# Patient Record
Sex: Female | Born: 1935
Health system: Southern US, Community
[De-identification: ages and names within clinical notes are randomized; demographics above are authoritative.]

## PROBLEM LIST (undated history)

## (undated) DIAGNOSIS — I499 Cardiac arrhythmia, unspecified: Secondary | ICD-10-CM

## (undated) DIAGNOSIS — F411 Generalized anxiety disorder: Secondary | ICD-10-CM

## (undated) DIAGNOSIS — R251 Tremor, unspecified: Secondary | ICD-10-CM

## (undated) DIAGNOSIS — E785 Hyperlipidemia, unspecified: Secondary | ICD-10-CM

## (undated) DIAGNOSIS — A159 Respiratory tuberculosis unspecified: Secondary | ICD-10-CM

## (undated) DIAGNOSIS — Z8673 Personal history of transient ischemic attack (TIA), and cerebral infarction without residual deficits: Secondary | ICD-10-CM

## (undated) DIAGNOSIS — H9190 Unspecified hearing loss, unspecified ear: Secondary | ICD-10-CM

## (undated) DIAGNOSIS — N390 Urinary tract infection, site not specified: Secondary | ICD-10-CM

## (undated) DIAGNOSIS — F32A Depression, unspecified: Secondary | ICD-10-CM

## (undated) DIAGNOSIS — R0600 Dyspnea, unspecified: Secondary | ICD-10-CM

## (undated) DIAGNOSIS — E559 Vitamin D deficiency, unspecified: Secondary | ICD-10-CM

## (undated) DIAGNOSIS — E871 Hypo-osmolality and hyponatremia: Secondary | ICD-10-CM

## (undated) DIAGNOSIS — I10 Essential (primary) hypertension: Secondary | ICD-10-CM

## (undated) DIAGNOSIS — F329 Major depressive disorder, single episode, unspecified: Secondary | ICD-10-CM

## (undated) DIAGNOSIS — R011 Cardiac murmur, unspecified: Secondary | ICD-10-CM

## (undated) DIAGNOSIS — I639 Cerebral infarction, unspecified: Secondary | ICD-10-CM

## (undated) HISTORY — PX: CARDIAC CATHETERIZATION: SHX172

## (undated) HISTORY — DX: Vitamin D deficiency, unspecified: E55.9

## (undated) HISTORY — DX: Personal history of transient ischemic attack (TIA), and cerebral infarction without residual deficits: Z86.73

## (undated) HISTORY — DX: Hypo-osmolality and hyponatremia: E87.1

## (undated) HISTORY — DX: Essential (primary) hypertension: I10

## (undated) HISTORY — DX: Urinary tract infection, site not specified: N39.0

## (undated) HISTORY — DX: Hyperlipidemia, unspecified: E78.5

## (undated) HISTORY — DX: Cardiac arrhythmia, unspecified: I49.9

## (undated) HISTORY — DX: Generalized anxiety disorder: F41.1

## (undated) HISTORY — DX: Cerebral infarction, unspecified: I63.9

## (undated) HISTORY — PX: EXTERNAL EAR SURGERY: SHX627

---
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
## 2004-12-07 ENCOUNTER — Emergency Department: Payer: Self-pay | Admitting: Emergency Medicine

## 2004-12-07 ENCOUNTER — Other Ambulatory Visit: Payer: Self-pay

## 2005-01-23 ENCOUNTER — Emergency Department: Payer: Self-pay | Admitting: General Practice

## 2005-03-24 ENCOUNTER — Ambulatory Visit: Payer: Self-pay | Admitting: Family Medicine

## 2010-05-22 DIAGNOSIS — I639 Cerebral infarction, unspecified: Secondary | ICD-10-CM

## 2010-05-22 HISTORY — DX: Cerebral infarction, unspecified: I63.9

## 2010-09-08 ENCOUNTER — Inpatient Hospital Stay (HOSPITAL_COMMUNITY)
Admission: EM | Admit: 2010-09-08 | Discharge: 2010-09-15 | DRG: 065 | Disposition: A | Discharge status: Rehabilitation Facility | Payer: Medicare Other | Attending: Neurosurgery | Admitting: Neurosurgery

## 2010-09-08 DIAGNOSIS — N390 Urinary tract infection, site not specified: Secondary | ICD-10-CM | POA: Diagnosis present

## 2010-09-08 DIAGNOSIS — Z902 Acquired absence of lung [part of]: Secondary | ICD-10-CM

## 2010-09-08 DIAGNOSIS — I10 Essential (primary) hypertension: Secondary | ICD-10-CM | POA: Diagnosis present

## 2010-09-08 DIAGNOSIS — I619 Nontraumatic intracerebral hemorrhage, unspecified: Principal | ICD-10-CM | POA: Diagnosis present

## 2010-09-08 DIAGNOSIS — F341 Dysthymic disorder: Secondary | ICD-10-CM | POA: Diagnosis present

## 2010-09-08 DIAGNOSIS — E785 Hyperlipidemia, unspecified: Secondary | ICD-10-CM | POA: Diagnosis present

## 2010-09-08 DIAGNOSIS — Z880 Allergy status to penicillin: Secondary | ICD-10-CM

## 2010-09-08 LAB — MRSA PCR SCREENING: MRSA by PCR: NEGATIVE

## 2010-09-09 LAB — GLUCOSE, CAPILLARY
Glucose-Capillary: 106 mg/dL — ABNORMAL HIGH (ref 70–99)
Glucose-Capillary: 104 mg/dL — ABNORMAL HIGH (ref 70–99)

## 2010-09-10 LAB — GLUCOSE, CAPILLARY
Glucose-Capillary: 100 mg/dL — ABNORMAL HIGH (ref 70–99)
Glucose-Capillary: 104 mg/dL — ABNORMAL HIGH (ref 70–99)

## 2010-09-12 ENCOUNTER — Inpatient Hospital Stay (HOSPITAL_COMMUNITY): Payer: Medicare Other

## 2010-09-14 DIAGNOSIS — I619 Nontraumatic intracerebral hemorrhage, unspecified: Secondary | ICD-10-CM

## 2010-09-14 DIAGNOSIS — I69993 Ataxia following unspecified cerebrovascular disease: Secondary | ICD-10-CM

## 2010-09-15 ENCOUNTER — Inpatient Hospital Stay (HOSPITAL_COMMUNITY)
Admission: RE | Admit: 2010-09-15 | Discharge: 2010-09-22 | DRG: 945 | Disposition: A | Discharge status: Home / Self Care | Payer: Medicare Other | Source: Other Acute Inpatient Hospital | Attending: Physical Medicine & Rehabilitation | Admitting: Physical Medicine & Rehabilitation

## 2010-09-15 DIAGNOSIS — E785 Hyperlipidemia, unspecified: Secondary | ICD-10-CM | POA: Diagnosis present

## 2010-09-15 DIAGNOSIS — I619 Nontraumatic intracerebral hemorrhage, unspecified: Secondary | ICD-10-CM | POA: Diagnosis present

## 2010-09-15 DIAGNOSIS — I69993 Ataxia following unspecified cerebrovascular disease: Secondary | ICD-10-CM

## 2010-09-15 DIAGNOSIS — I10 Essential (primary) hypertension: Secondary | ICD-10-CM | POA: Diagnosis present

## 2010-09-15 DIAGNOSIS — Z5189 Encounter for other specified aftercare: Secondary | ICD-10-CM

## 2010-09-15 DIAGNOSIS — F341 Dysthymic disorder: Secondary | ICD-10-CM | POA: Diagnosis present

## 2010-09-16 DIAGNOSIS — I69993 Ataxia following unspecified cerebrovascular disease: Secondary | ICD-10-CM

## 2010-09-16 DIAGNOSIS — I619 Nontraumatic intracerebral hemorrhage, unspecified: Secondary | ICD-10-CM

## 2010-09-16 LAB — DIFFERENTIAL
Eosinophils Relative: 7 % — ABNORMAL HIGH (ref 0–5)
Lymphocytes Relative: 17 % (ref 12–46)
Lymphs Abs: 1.7 10*3/uL (ref 0.7–4.0)
Monocytes Absolute: 1 10*3/uL (ref 0.1–1.0)
Neutro Abs: 6.5 10*3/uL (ref 1.7–7.7)

## 2010-09-16 LAB — COMPREHENSIVE METABOLIC PANEL
ALT: 10 U/L (ref 0–35)
Alkaline Phosphatase: 56 U/L (ref 39–117)
BUN: 5 mg/dL — ABNORMAL LOW (ref 6–23)
CO2: 28 mEq/L (ref 19–32)
GFR calc non Af Amer: >60 mL/min (ref >60)
Glucose, Bld: 101 mg/dL — ABNORMAL HIGH (ref 70–99)
Potassium: 3.5 mEq/L (ref 3.5–5.1)
Total Protein: 5.6 g/dL — ABNORMAL LOW (ref 6.0–8.3)

## 2010-09-16 LAB — CBC
HCT: 36.7 % (ref 36.0–46.0)
Hemoglobin: 12.3 g/dL (ref 12.0–15.0)
MCHC: 33.5 g/dL (ref 30.0–36.0)
MCV: 87.8 fL (ref 78.0–100.0)
RDW: 13.6 % (ref 11.5–15.5)
WBC: 10 10*3/uL (ref 4.0–10.5)

## 2010-09-16 NOTE — H&P (Signed)
Tamara Sparks, Tamara Sparks            ACCOUNT NO.:  [id number]  MEDICAL RECORD NO.:  [id number]           PATIENT TYPE:  I  LOCATION:  4029                         FACILITY:  [facility]  PHYSICIAN:  Erick Colace, M.D.DATE OF BIRTH:  03-05-1929  DATE OF ADMISSION:  09/15/2010 DATE OF DISCHARGE:                             HISTORY & PHYSICAL   REASON FOR ADMISSION:  Poor balance, coordination following right cerebellar intracranial bleed.  HISTORY:  A 77 year old female from [place], [place], admitted on September 08, 2010, from an outside hospital after she developed a dizziness and vomiting while driving to [place] to visit family.  Cranial CT at outside hospital showed a right cerebellar hemorrhage.  Upon admission to the [facility], she had a cranial CT demonstrating a 16 x 21 subacute cerebellar ICH.  No hydrocephalus.  Neurosurgery evaluated the patient. No surgery indicated.  Conservative care including close monitoring ofblood pressure and treatment with Tenormin and Benicar.  Hospital course was complicated by poor appetite.  She was recently widowed 5 days ago. Continues to grieve.  PT, OT had been following the patient, but because of continued need for physical assistance, Physical Medicine Rehab was consulted on September 14, 2010, felt to be good rehab candidate and arrangements were made for admission to inpatient rehab.  REVIEW OF SYSTEMS:  Positive for poor appetite, positive for anxiety, positive for depression, positive for dizziness, otherwise negative.  PAST HISTORY:  Hypertension, hyperlipidemia, anxiety, depression.  She has had past surgical history significant for partial lung resection secondary to TB as well as tympanic membrane repair.  FAMILY HISTORY:  Positive for CAD.  HABITS:  Negative EtOH, negative tobacco.  SOCIAL HISTORY:  Widowed.  Children in the area, plan assistance.  Can also hire assist as needed.  One level home.  FUNCTIONAL HISTORY:   Independent driving prior to admission.  HOME MEDICATIONS: 1. Benicar 40 mg a day. 2. Lorazepam 0.5 daily p.r.n. 3. Atenolol 50 p.o. daily. 4. Zocor 40 mg daily. 5. Vitamin D supplements daily.  ALLERGIES:  PENICILLIN.  PHYSICAL EXAMINATION:  VITAL SIGNS:  Blood pressure 142/62, pulse 72, respirations 19, temperature 98.1. GENERAL:  This is a frail-appearing female, in no acute distress.  Mood and affect are appropriate. HEENT:  Eyes, anicteric, noninjected.  Extraocular movements intact.  No evidence of nystagmus.  External ENT normal. NECK:  Supple without adenopathy. CHEST:  Respiratory effort is good.  Lungs are clear. HEART:  Regular rate and rhythm.  No rubs, murmurs, or extra sounds. ABDOMEN:  Positive bowel sounds, soft, nontender to palpation. EXTREMITIES:  Gait, please refer functional status.  Digits, nails are intact.  Range of motion mildly diminished on the right side in the upper and lower extremity.  There is evidence of dysmetria on finger- nose-finger testing on the right side compared to left side, but this is mild on the right lower extremity.  Heel-to-shin testing shows mild-to- moderate dysmetria.  She is oriented x3.  Affect is depressed, but otherwise no evidence of agitation or lability.  Memory is intact.  POST ADMISSION PHYSICIAN EVALUATION: 1. Functional deficits secondary to right cerebellar ICH with right  hemiataxia. 2. The patient was admitted to receive collaborative interdisciplinary     care between physiatrist, rehab nursing staff, and therapy team. 3. The patient's level of medical complexity and substantial therapy     needs in context of that medical necessity cannot be provided at a     lesser intensive care such as SNF. 4. The patient has experienced substantial functional loss from her     baseline.  Upon functional assessment at the time of preadmission     screening, the patient was at a min assist to mod assist bed     mobility,  mod assist transfers, min assist ambulation bilateral     handheld 40 feet, min assist upper body and max assist lower body     ADLs.  Currently, the patient is a min assist bed mobility, mod     assist transfers, +2 total 90% bilateral handheld assist for     ambulation, min assist upper body dressing, min assist lower body     dressing, mod assist toilet transfers.  Judging by the patient's     diagnosis, physical exam, functional history, the patient has a     potential for functional progress which will result in measurable     gains while on inpatient rehab.  These gains will be of substantial     practical use upon discharge home in facilitating mobility, self-     care, and independence.  Interim change in medical status since     preadmission screening are detailed in the history of present     illness. 5. Physiatrist will provide 24-hour management in medical needs as     well as oversight of therapy plan/treatment and provide guidance     appropriate regarding interactions of the two. 6. A 24-hour rehab nursing will assess in management of skin, bowel,     bladder, safety, and help integrate therapy concepts, techniques,     and education. 7. PT will assess and treat for pregait training, gait training,     endurance, safety, equipment goals are for a modified independent     level for all mobility. 8. OT will assess and treat for ADLs, safety, coordination, equipment     goals are for modified independent level with supervision with     ADLs. 9. Case management and social work will assess and treat for     psychosocial issues and discharge planning. 10.Team conference will be held weekly to assess the patient's     progress/goals and to determine barriers to discharge. 11.The patient has demonstrated sufficient medical stability and     exercise capacity to tolerate at least 3 hours therapy per day at     least 5 days per week. 12.Estimated length of stay is 10 days.   Prognosis for further     functional improvement is good.  MEDICAL PROBLEMS AND PLAN: 1. DVT prophylaxis with thigh-high TEDs as well as SCDs. 2. Pain management mainly headaches, Tylenol. 3. Mood, grieving, Psychology to eval. 4. Hypertension.  Tenormin and Benicar monitor with increased     activity. 5. Hyperlipidemia.  Zocor.  Monitor for signs of diffuse weakness. 6. Anxiety, depression, lorazepam p.r.n.  Provide emotional support in     conjunction with Psychology.  The patient has adequate motivation and mood for inpatient rehab.  We will also need to monitor her intake.     Erick Colace, M.D.     AEK/MEDQ  D:  09/15/2010  T:  09/16/2010  Job:  [id number]  cc:   Melina Fiddler, M.D.  Electronically Signed by Claudette Laws M.D. on 09/16/2010 06:15:00 AM

## 2010-09-20 DIAGNOSIS — I69993 Ataxia following unspecified cerebrovascular disease: Secondary | ICD-10-CM

## 2010-09-20 DIAGNOSIS — I619 Nontraumatic intracerebral hemorrhage, unspecified: Secondary | ICD-10-CM

## 2010-09-21 DIAGNOSIS — F4321 Adjustment disorder with depressed mood: Secondary | ICD-10-CM

## 2010-09-27 ENCOUNTER — Ambulatory Visit: Payer: Medicare Other | Admitting: Physical Therapy

## 2010-09-29 NOTE — Discharge Summary (Signed)
NAMEROZLYNN, LIPPOLD            ACCOUNT NO.:  [id number]  MEDICAL RECORD NO.:  [id number]           PATIENT TYPE:  I  LOCATION:  4029                         FACILITY:  [facility]  PHYSICIAN:  Ranelle Oyster, M.D.DATE OF BIRTH:  11-08-1928  DATE OF ADMISSION:  09/15/2010 DATE OF DISCHARGE:  09/22/2010                              DISCHARGE SUMMARY   DISCHARGE DIAGNOSES: 1. Right cerebellar intracerebral hemorrhage, Tylenol. 2. Depression with anxiety. 3. Hypertension. 4. Hyperlipidemia.  This is a 77 year old white female from [place], [place], admitted on September 08, 2010, from outside hospital after she developed dizziness and vomiting while driving to [place] to visit her family.  Cranial CT scan at outside hospital showed a right cerebellar hemorrhage.  Upon admission to [facility], cranial CT scan showing a 16 x 21 mm subacute cerebellar intracerebral hemorrhage without hydrocephalus. Neurosurgery, Dr. Coletta Memos, no surgery indicated with conservative care, close monitoring of blood pressure.  Hospital course with some decrease in appetite.  She was recently widowed with bereavement counseling provided.  She was minimal assist for ambulation.  She was admitted for comprehensive rehab program.  PAST MEDICAL HISTORY:  See discharge diagnoses.  No alcohol or tobacco.  ALLERGIES:  PENICILLIN.  SOCIAL HISTORY:  She is widowed times a month, children in area with good assistance, 1-level home.  FUNCTIONAL HISTORY PRIOR TO ADMISSION:  Independent.  She does drive.  FUNCTIONAL STATUS UPON ADMISSION TO REHAB SERVICES:  Minimum to moderate assist bed mobility, moderate assist transfers, handheld assist 40 feet to ambulate, minimal assist lower body and activities of daily living.  MEDICATIONS PRIOR TO ADMISSION: 1. Benicar 40 mg daily. 2. Lorazepam 0.5 mg daily. 3. Atenolol 50 mg daily. 4. Zocor 40 mg daily. 5. Vitamin D daily.  PHYSICAL EXAMINATION:   VITAL SIGNS:  Blood pressure 142/62, pulse 72, temperature 98.1, and respirations 19. NEUROLOGIC:  This was an alert female in no acute distress, oriented x3, emotionally labile.  Deep tendon reflexes are 2+.  Sensation is intact to light touch. LUNGS:  Clear to auscultation. CARDIAC:  Regular rate and rhythm. ABDOMEN:  Soft and nontender.  Good bowel sounds.  REHABILITATION HOSPITAL COURSE:  The patient was admitted to Inpatient Rehab Services with therapies initiated on a 3-hour daily basis consisting of physical therapy, occupational therapy, and 24-hour rehabilitation nursing.  The following issues were addressed during the patient's rehabilitation stay.  Pertaining to Ms. Meixner's right cerebellar intracerebral hemorrhage, remained stable.  Close monitoring of blood pressures.  She would follow up with Dr. Coletta Memos of Neurosurgery.  She denied any increased headache, nausea, or vomiting. Her blood pressures were well controlled on Tenormin which was titrated to 100 mg daily.  She remained on Benicar with the addition of low-dose hydrochlorothiazide on Sep 21, 2010.  She would follow up with her primary care provider.  There was no orthostatic hypotension noted.  She will continue on Zocor for hyperlipidemia.  She received grieving counseling from neuropsychologist, Dr. Eula Flax, recently widowed x1 month.  She had been placed on Celexa as well as low-dose Xanax as needed with emotional support provided.  The patient received  weekly collaborative interdisciplinary team conferences to discuss estimated length of stay, family teaching, and any barriers to discharge.  She was supervision with activities of daily living, minimal assist ambulate, supervision transfers.  She exhibited no unsafe behavior.  She was continent of bowel and bladder.  Strength and endurance greatly improved throughout her rehab stay.  She was discharged to home with home health therapies as  indicated per [employer] with family support.  LATEST LABORATORY DATA:  Sodium 139, potassium 3.5, BUN 5, and creatinine 0.6.  Hemoglobin 12.3, hematocrit 36.7, and platelets 348,000.  DISCHARGE MEDICATIONS AT THE TIME OF DICTATION: 1. Hydrochlorothiazide 12.5 mg daily. 2. Vitamin D 400 units daily. 3. Benicar 40 mg daily. 4. Zocor 40 mg daily. 5. Multivitamin daily. 6. Celexa 10 mg at bedtime. 7. Tenormin 100 mg daily. 8. Tylenol as needed.  DIET:  Regular.  SPECIAL INSTRUCTION:  No driving.  No smoking.  No alcohol.  Follow up with Dr. Coletta Memos, Neurosurgery, [phone], call for appointment; Dr. Faith Rogue at the Outpatient Rehab Service office as advised; and Dr. Lacie Scotts [facility] 2 weeks, call for appointment.  Ongoing therapies would be dictated as per [employer].     Mariam Dollar, P.A.   ______________________________ Ranelle Oyster, M.D.    DA/MEDQ  D:  09/21/2010  T:  09/21/2010  Job:  [id number]  cc:   Melina Fiddler, M.D.  Electronically Signed by Mariam Dollar P.A. on 09/21/2010 12:11:17 PM Electronically Signed by Faith Rogue M.D. on 09/29/2010 09:48:12 AM

## 2010-10-07 NOTE — H&P (Signed)
Tamara Sparks, Tamara Sparks            ACCOUNT NO.:  [id number]  MEDICAL RECORD NO.:  [id number]           PATIENT TYPE:  I  LOCATION:  2116                         FACILITY:  [facility]  PHYSICIAN:  Coletta Memos, M.D.     DATE OF BIRTH:  June 01, 1928  DATE OF ADMISSION:  09/08/2010 DATE OF DISCHARGE:                             HISTORY & PHYSICAL   ADMISSION DIAGNOSIS:  Right cerebellar hemorrhage.  INDICATIONS:  Tamara Sparks is a 77 year old woman who was driving to the area around [place], [place], when she had the  acute onset of dizziness and emesis.  She vomited three times rapidly in succession according to the history provided by [facility] and Tamara Sparks.  She has been dizzy in the past at times, but she had not had anything occur quite like this.  She had no abdominal pain.  She had no diarrhea.  She had no evidence of gastrointestinal infection.  She was in a car on a trip from [place], but decided to keep ongoing.  When they arrived at their destination, then she was taken to the [facility] [place].  At that time, she was admitted to the hospital for observation.  Medications upon presentation were Ativan, simvastatin, atenolol, and Benicar.  She was in the hospital was noted to also have a urinary tract infection.  It was felt that this might have been the reason for the nausea and vomiting.  However, she was given some IV Rocephin for this problem, but she still did not feel quite right.  A head CT was finally ordered today on September 08, 2010, and that revealed a right cerebellar hemorrhage.  There was very slight mass effect on the fourth ventricle, absolutely no evidence of hydrocephalus.  She has been stable.  She was therefore transferred to [facility] as Tamara Sparks and the family are from the [place] area and she wanted to be closer to home.  Medications on transfer included Ativan, simvastatin, atenolol,  Benicar, Rocephin, meclizine, and Zofran.  She was felt to be volume depleted and was treated for that there also.  She has an allergy to PENICILLIN.  She has not been ambulatory secondary to problems with coordination.  She also has reported weakness on left side and blurred vision.  Her exam in the emergency room did not show evidence of weakness.  She does not use tobacco.  She does not use illicit drugs or alcohol.  She recently lost her husband 4 weeks ago.  REVIEW OF SYSTEMS:  Negative for constitutional, eye, ear, nose or mouth, cardiovascular, gastrointestinal, musculoskeletal, skin, psychiatric, endocrine or hematologic problems.  She has been very depressed secondary to her husband's death.  She has no pain.  No persistent headaches outside of one which she describes as frontal currently.  Mother died in her 74 secondary to natural causes, type unknown.  Father had coronary artery disease and is also deceased.  She has undergone a partial lung resection secondary to tuberculosis. She also had a tympanic membrane repair, but she does not know what was done exactly.  On admission, she had a white count  of 22,000, platelet count of 395,000.  Sodium was 138, potassium 3.6.  Quite honestly, Tamara Sparks has been quite stable since her admission to [facility].  I will contact Stroke Service.  She will certainly stay on my service overnight at the least, but she does not have any surgical issues at this time.  Looking at a head CT again while she does have some distortion of fourth ventricle, it is not close.  She has absolutely no hydrocephalus and ventricles are normal in size.  No effacement.  There is some hypodensity around the clot already indicating that her story is consistent with this scan showing some blood that has been there for some time.  I will continue the Rocephin for the UTI and provide close monitoring while here in the intensive care  unit.  NEUROLOGIC:  Pupils equal, round, react to light.  Full extraocular movements.  Full visual fields.  Speech is clear and fluent.  No nystagmus.  No dysarthria, 5/5 strength in the upper and lower extremities.  No drift.  Normal proprioception upper and lower extremities.  Normal muscle tone, bulk and coordination, symmetric smile, symmetric facies.  Hearing intact to voice bilaterally. NECK:  No cervical masses or bruits. LUNGS: Clear. HEART: Regular rhythm and rate. ABDOMEN:  Soft, nontender.  Bowel sounds present.          ______________________________ Coletta Memos, M.D.     KC/MEDQ  D:  09/08/2010  T:  09/09/2010  Job:  [id number]  Electronically Signed by Coletta Memos M.D. on 10/07/2010 11:32:14 AM

## 2010-10-11 ENCOUNTER — Ambulatory Visit: Payer: Medicare Other | Admitting: Occupational Therapy

## 2010-10-27 NOTE — H&P (Signed)
  NAMECHESTINE, BELKNAP            ACCOUNT NO.:  [id number]  MEDICAL RECORD NO.:  [id number]           PATIENT TYPE:  LOCATION:                                 FACILITY:  PHYSICIAN:  Coletta Memos, M.D.     DATE OF BIRTH:  06-29-1928  DATE OF ADMISSION: DATE OF DISCHARGE:                             HISTORY & PHYSICAL   This is the second admission history and physical which I am dictating. She has a full written H and P in the chart which is sufficient for legal matters.  Tamara Sparks is a 77 year old woman transferred secondary to a CT performed on September 08, 2010, revealing a cerebellar hematoma.  She presented originally on September 06, 2010, with explosive emesis and associated with nausea.  She was noted to have a urinary tract infection and was receiving treatment for that.  While in the hospital, we eventually performed a CT showing a cerebellar hematoma and she was transferred for further evaluation and care.  Since that time of her admission on September 06, 2010, she has been stable.  PAST MEDICAL HISTORY: 1. Nausea and vomiting. 2. Tuberculosis. 3. Hypertension. 4. Hyperlipidemia. 5. Anxiety disorder. 6. UTI.  REVIEW OF SYSTEMS:  Positive for nausea, emesis, hypertension, hyperlipidemia, and anxiety disorder.  She has had some problems with dizziness and coordination.  No hematologic, allergic, endocrine, constitutional, or gastrointestinal problems.  PHYSICAL EXAMINATION:  GENERAL:  She is alert, oriented x4 and answering all questions appropriately.  Speech is clear and fluent. NEUROLOGIC:  Pupils are equal, round, and reactive to light.  Full extraocular movements.  Full visual fields.  She has symmetric facies. Tongue protrudes in midline.  Uvula elevates in midline.  Shoulder shrug is intact.  There is no drift on exam.  5/5 strength in upper extremities and lower extremities.  Normal muscle tone, bulk, and coordination.  No cervical masses or bruits. LUNGS:   Clear. HEART:  Regular rhythm and rate.  No murmurs or rubs are appreciated. EXTREMITIES:  I did not assess gait as I left her in the hospital bed.  Head CT revealed a right cerebellar intracerebral hematoma with surrounding hypodensity, some mild distortion of the fourth ventricle. There was no hydrocephalus.  Fourth ventricle was patent.  She will be admitted to 2100 for observation.  I will repeat the head CT.  She does not need a ventricular catheter.  She does not need operative decompression at this time.          ______________________________ Coletta Memos, M.D.     KC/MEDQ  D:  10/07/2010  T:  10/08/2010  Job:  [id number]  Electronically Signed by Coletta Memos M.D. on 10/27/2010 04:54:09 PM

## 2010-11-02 ENCOUNTER — Inpatient Hospital Stay: Payer: Medicare Other | Admitting: Physical Medicine & Rehabilitation

## 2010-11-09 ENCOUNTER — Ambulatory Visit: Payer: Medicare Other | Admitting: Occupational Therapy

## 2010-11-16 ENCOUNTER — Encounter: Payer: Medicare Other | Admitting: Physical Medicine & Rehabilitation

## 2010-12-09 NOTE — Discharge Summary (Signed)
  [name], [name]            ACCOUNT NO.:  [id number]  MEDICAL RECORD NO.:  [id number]  LOCATION:  3038                         FACILITY:  [facility]  PHYSICIAN:  Coletta Memos, M.D.     DATE OF BIRTH:  Feb 08, 1929  DATE OF ADMISSION:  09/08/2010 DATE OF DISCHARGE:  09/15/2010                              DISCHARGE SUMMARY   ADMITTING DIAGNOSES: 1. Cerebellar hematoma, right cerebellar hemisphere. 2. Emesis and nausea.  INDICATIONS:  Ms. Boulos was admitted on April 19 secondary to a right cerebellar hemispheric hematoma.  She had associated emesis and nausea.  She had an UTI, was in another hospital when this hematoma was found.  She was transferred secondary to the CT, which was performed on September 08, 2010.  She is alert, oriented x4, answered all questions appropriately.  Speech is clear and fluent.  She had no hydrocephalus. She needed no surgical treatment.  She did have some mild dysdiadochokinesis on her right side, mild difficulty with heel-to-shin testing.  She had some problems with blood pressure, which were controlled with medications.  She had a Medicine consult for that.  She was transferred to rehab, doing well, and without problems.  Family was involved and she was discharged to rehab on April 26.          ______________________________ Coletta Memos, M.D.     KC/MEDQ  D:  12/08/2010  T:  12/08/2010  Job:  [id number]  Electronically Signed by Coletta Memos M.D. on 12/09/2010 05:27:38 PM

## 2014-01-21 ENCOUNTER — Ambulatory Visit (INDEPENDENT_AMBULATORY_CARE_PROVIDER_SITE_OTHER): Payer: Medicare Other | Admitting: Cardiology

## 2014-01-21 ENCOUNTER — Encounter: Payer: Self-pay | Admitting: Cardiology

## 2014-01-21 VITALS — BP 100/60 | HR 73 | Ht 60.0 in | Wt 109.5 lb

## 2014-01-21 DIAGNOSIS — R011 Cardiac murmur, unspecified: Secondary | ICD-10-CM

## 2014-01-21 DIAGNOSIS — R000 Tachycardia, unspecified: Secondary | ICD-10-CM

## 2014-01-21 DIAGNOSIS — I10 Essential (primary) hypertension: Secondary | ICD-10-CM

## 2014-01-21 NOTE — Patient Instructions (Signed)
Your physician has recommended you make the following change in your medication:  Stop Amlodipine (do not throw it away incase we need to restart it)  Call if you systolic blood pressure is >120 consistently   Your physician has requested that you have an echocardiogram. Echocardiography is a painless test that uses sound waves to create images of your heart. It provides your doctor with information about the size and shape of your heart and how well your heart's chambers and valves are working. This procedure takes approximately one hour. There are no restrictions for this procedure.   Your physician recommends that you schedule a follow-up appointment in:  1-2 months   Call us if you have anymore episodes

## 2014-01-23 ENCOUNTER — Encounter: Payer: Self-pay | Admitting: Cardiology

## 2014-01-23 DIAGNOSIS — I38 Endocarditis, valve unspecified: Secondary | ICD-10-CM | POA: Insufficient documentation

## 2014-01-23 DIAGNOSIS — I10 Essential (primary) hypertension: Secondary | ICD-10-CM | POA: Insufficient documentation

## 2014-01-23 DIAGNOSIS — R000 Tachycardia, unspecified: Secondary | ICD-10-CM | POA: Insufficient documentation

## 2014-01-23 HISTORY — DX: Endocarditis, valve unspecified: I38

## 2014-01-23 NOTE — Assessment & Plan Note (Signed)
If anything a bit hypotensive today. I would prefer for her to probably have some mild permissive hypertension.  Plan: Stop amlodipine and continue beta blocker plus ARB. Periodically monitor blood pressures at home.

## 2014-01-23 NOTE — Assessment & Plan Note (Addendum)
Interestingly finding that sounds potentially like aortic regurgitation. We will evaluate with the echocardiogram..

## 2014-01-23 NOTE — Progress Notes (Signed)
PATIENT: Tamara Sparks MRN: [id number] DOB: 02-05-1929 PCP: Wonda Cheng, MD  Clinic Note: Chief Complaint  Patient presents with  . other    Ref by [facility] due to arrhythmia. Meds reviewed verbally with pt.   HPI: Tamara Sparks is a 80 y.o. female with a PMH below who presents today for evaluations of heart arrhythmia.. She is a very pleasant elderly woman with a history of hypertension, hyponatremia, anxiety and dyslipidemia as well as a history of a small right cerebellar intracerebral hemorrhage in 2012.  She has been treated for hypertension for quite some time she, and inadvertently stopped taking Bystolic in August and using it with another medication that was scheduled to be discontinued. Coincident with her stopping her Bystolic, he started noting intermittent episodes of feeling as though her heart was racing very fast. It made her feel little bit dizzy and harder to breathe to but really denied any chest tightness or pressure associated with it. No real significant dyspnea. She doesn't drink a lot of coffee or other caffeinated beverages, and was unable to tell any activity or behaviors that would elicit this heart rate response. This came out of the blue. She's not had any syncope or near syncope but has felt somewhat fatigued and drained and the cervix then dizzy related to recently diagnosed UTI with dysuria. She felt warm but not really truly febrile. Interestingly, since she has restarted her Bystolic after her visit to discuss the UTI, she's not had any further palpitations.  She denies any PND, orthopnea or edema. Prior to this bout of palpitations, she's never had symptoms like this before.  Past Medical History  Diagnosis Date  . Urinary tract infection, site not specified   . Unspecified essential hypertension   . Cardiac dysrhythmia, unspecified   . Anxiety state, unspecified   . Unspecified vitamin D deficiency   . Dyslipidemia, goal LDL below 130   .  Hyposmolality and/or hyponatremia   . History of stroke     Right cerebellar intracerebral hemorrhage    Prior Cardiac Evaluation and Past Surgical History: Past Surgical History  Procedure Laterality Date  . Cardiac catheterization      [facility]    Allergies  Allergen Reactions  . Penicillins   . Sulfa Antibiotics     Current Outpatient Prescriptions  Medication Sig Dispense Refill  . aspirin 81 MG tablet Take 81 mg by mouth daily.      . Cholecalciferol (VITAMIN D3) 5000 UNITS CAPS Take by mouth daily.      . ciprofloxacin (CIPRO) 250 MG tablet Take 250 mg by mouth 2 (two) times daily.       Marland Kitchen LORazepam (ATIVAN) 0.5 MG tablet Take 0.5 mg by mouth every 8 (eight) hours.      Marland Kitchen losartan (COZAAR) 100 MG tablet Take 100 mg by mouth daily.      . nebivolol (BYSTOLIC) 10 MG tablet Take 10 mg by mouth daily.      . simvastatin (ZOCOR) 20 MG tablet Take 20 mg by mouth daily.       No current facility-administered medications for this visit.    History   Social History Narrative   Widowed. Accompanied by daughter today.   No illicit drug use. Never smoked. Does not drink alcohol.   family history includes Hypertension in her father; Stroke in her father and mother.  ROS: A comprehensive Review of Systems - was performed Review of Systems  Constitutional: Positive for fever. Negative for weight  loss.       Recently diagnosed with UTI  HENT: Negative for hearing loss.   Eyes: Positive for blurred vision.       Reportedly is due to have cataract surgery  Respiratory: Negative for cough, hemoptysis, sputum production, shortness of breath and wheezing.   Cardiovascular: Positive for palpitations. Negative for chest pain, orthopnea, claudication, leg swelling and PND.       As noted in HPI  Gastrointestinal: Positive for blood in stool. Negative for nausea, vomiting, diarrhea, constipation and melena.  Genitourinary: Positive for dysuria, urgency and frequency. Negative for hematuria  and flank pain.  Neurological: Negative for dizziness, tingling, tremors, speech change, focal weakness, seizures, loss of consciousness, weakness and headaches.  Endo/Heme/Allergies: Negative for polydipsia. Does not bruise/bleed easily.  All other systems reviewed and are negative.  PHYSICAL EXAM BP 100/60  Pulse 73  Ht 5' (1.524 m)  Wt 109 lb 8 oz (49.669 kg)  BMI 21.39 kg/m2 Physical Exam  Constitutional: She is oriented to person, place, and time. She appears well-nourished. No distress.  HENT:  Head: Normocephalic and atraumatic.  Mouth/Throat: Oropharynx is clear and moist.  Wears glasses  Eyes: EOM are normal. Pupils are equal, round, and reactive to light. No scleral icterus.  Neck: Normal range of motion. Neck supple. No JVD present. No tracheal deviation present. No thyromegaly present.  Cardiovascular: Normal rate, regular rhythm and intact distal pulses.  Exam reveals no gallop, no S3, no S4, no friction rub, no midsystolic click and no opening snap.   Murmur heard. Soft 1/6 diastolic murmur at the LUSB; did not hear SEM  Pulmonary/Chest: Effort normal and breath sounds normal. No respiratory distress. She has no wheezes. She has no rales. She exhibits no tenderness.  Abdominal: Soft. Bowel sounds are normal. She exhibits no distension. There is no tenderness.  Genitourinary:  Defer  Musculoskeletal: Normal range of motion. She exhibits no edema.  Neurological: She is alert and oriented to person, place, and time. No cranial nerve deficit. Coordination normal.  Somewhat poor historian. A bit confused. Her daughter fills in the gaps  Skin: Skin is warm and dry. No rash noted. No erythema.  Psychiatric: She has a normal mood and affect.  A bit confused    Adult ECG Report  Rate: 73 ;  Rhythm: normal sinus rhythm  QRS Axis: -49 ;  PR Interval: 174 ;  QRS Duration: 90 ; QTc: 438  Voltages: Borderline Low (lower in limb leads and precordial)   Narrative Interpretation:  Normal sinus rhythm with left anterior fascicular block.  Recent Labs: Reviewed from PCPs note  Sodium 134, potassium 3.2, chloride 88, bicarbonate 27, BUN 7, creatinine 0.4, calcium 10.1. Glucose 120  CBC: W. 10.2, H./H. 13.9/41.1, platelets 397  ASSESSMENT / PLAN: Very pleasant elderly woman who may be nearly stages of dementia who is being evaluated for palpitations but seems to be constant with her having stopped her beta blocker. My concern is that this could very well and presented a rebound effect from being off of Bystolic. In fact, her blood pressure is actually relatively low today which is somewhat concerning in an elderly woman for potential risk of fall and higher susceptibility to vasovagal syncope. Interestingly, she has a diastolic murmur which sounds like aortic insufficiency based on location. Is not loud, but does not appear to be previously noted.  Rapid heart beat I truly think this is probably related to her coming off of the Bystolic. Since she's not had  any more episodes in the last week, not sure if we pick anything up on a monitor. She does have a murmur that had not previously been evaluated. I don't think this is a new due to the palpitations however he will obtain an echocardiogram to assess for any structural abnormalities.  At this point, and condition. Continue with the beta blocker and try to avoid stopping it to avoid rebound tachycardia and hypertension.  If palpitations recur or if she is more symptomatic, I have asked that she contact our office and noted to have a cardiac event monitor ordered.  Diastolic murmur Interestingly finding that sounds potentially like aortic regurgitation. We will evaluate with the echocardiogram..  Essential hypertension If anything a bit hypotensive today. I would prefer for her to probably have some mild permissive hypertension.  Plan: Stop amlodipine and continue beta blocker plus ARB. Periodically monitor blood pressures at  home.   Orders Placed This Encounter  Procedures  . EKG 12-Lead    Order Specific Question:  Where should this test be performed    Answer:  [facility]  . 2D Echocardiogram without contrast    Standing Status: Future     Number of Occurrences:      Standing Expiration Date: 01/21/2015    Order Specific Question:  Type of Echo    Answer:  Complete    Order Specific Question:  Where should this test be performed    Answer:  [facility]    Order Specific Question:  Reason for exam-Echo    Answer:  Murmur  785.2    Followup: 1-2 months  Amisha Pospisil W. Herbie [place], M.D., M.S. Interventional Cardiolgy [facility]

## 2014-01-23 NOTE — Assessment & Plan Note (Signed)
I truly think this is probably related to her coming off of the Bystolic. Since she's not had any more episodes in the last week, not sure if we pick anything up on a monitor. She does have a murmur that had not previously been evaluated. I don't think this is a new due to the palpitations however he will obtain an echocardiogram to assess for any structural abnormalities.  At this point, and condition. Continue with the beta blocker and try to avoid stopping it to avoid rebound tachycardia and hypertension.  If palpitations recur or if she is more symptomatic, I have asked that she contact our office and noted to have a cardiac event monitor ordered.

## 2014-01-30 ENCOUNTER — Other Ambulatory Visit: Payer: Medicare Other

## 2014-03-25 ENCOUNTER — Ambulatory Visit: Payer: Medicare Other | Admitting: Cardiology

## 2014-07-23 DIAGNOSIS — F419 Anxiety disorder, unspecified: Secondary | ICD-10-CM | POA: Diagnosis not present

## 2014-07-23 DIAGNOSIS — E784 Other hyperlipidemia: Secondary | ICD-10-CM | POA: Diagnosis not present

## 2014-07-23 DIAGNOSIS — I10 Essential (primary) hypertension: Secondary | ICD-10-CM | POA: Diagnosis not present

## 2014-12-08 ENCOUNTER — Ambulatory Visit (INDEPENDENT_AMBULATORY_CARE_PROVIDER_SITE_OTHER): Payer: Medicare Other | Admitting: Family Medicine

## 2014-12-08 ENCOUNTER — Encounter: Payer: Self-pay | Admitting: Family Medicine

## 2014-12-08 ENCOUNTER — Encounter (INDEPENDENT_AMBULATORY_CARE_PROVIDER_SITE_OTHER): Payer: Self-pay

## 2014-12-08 VITALS — BP 124/71 | HR 68 | Temp 98.5°F | Resp 17 | Ht 61.0 in | Wt 105.8 lb

## 2014-12-08 DIAGNOSIS — F411 Generalized anxiety disorder: Secondary | ICD-10-CM | POA: Diagnosis not present

## 2014-12-08 DIAGNOSIS — I10 Essential (primary) hypertension: Secondary | ICD-10-CM

## 2014-12-08 DIAGNOSIS — F419 Anxiety disorder, unspecified: Secondary | ICD-10-CM | POA: Insufficient documentation

## 2014-12-08 MED ORDER — LORAZEPAM 0.5 MG PO TABS: 0.5000 mg | ORAL_TABLET | Freq: Two times a day (BID) | ORAL | Status: DC | PRN

## 2014-12-08 MED ORDER — LOSARTAN POTASSIUM 100 MG PO TABS: 100.0000 mg | ORAL_TABLET | Freq: Every day | ORAL | Status: DC

## 2014-12-08 NOTE — Progress Notes (Signed)
Name: Tamara Sparks   MRN: [id number]    DOB: [birth year]   Date:12/08/2014       Progress Note  Subjective  Chief Complaint  Chief Complaint  Patient presents with  . Medication Refill    Anxiety Presents for follow-up visit. Symptoms include nervous/anxious behavior and restlessness. Patient reports no chest pain, excessive worry, feeling of choking, insomnia, palpitations or shortness of breath.   Past treatments include benzodiazephines. Compliance with prior treatments has been good.  Hypertension This is a chronic problem. The problem is controlled. Associated symptoms include anxiety. Pertinent negatives include no blurred vision, chest pain, headaches, palpitations or shortness of breath. Past treatments include beta blockers and angiotensin blockers. The current treatment provides significant improvement. Hypertensive end-organ damage includes CVA. There is no history of angina, kidney disease or CAD/MI.      Past Medical History  Diagnosis Date  . Urinary tract infection, site not specified   . Unspecified essential hypertension   . Cardiac dysrhythmia, unspecified   . Anxiety state, unspecified   . Unspecified vitamin D deficiency   . Dyslipidemia, goal LDL below 130   . Hyposmolality and/or hyponatremia   . History of stroke     Right cerebellar intracerebral hemorrhage  . Stroke     Past Surgical History  Procedure Laterality Date  . Cardiac catheterization      [facility]  . Lobectomy Right 1940s  . External ear surgery Left     Family History  Problem Relation Age of Onset  . Stroke Mother   . Hypertension Father   . Stroke Father     History   Social History  . Marital Status: Widowed    Spouse Name: N/A  . Number of Children: N/A  . Years of Education: N/A   Occupational History  . Not on file.   Social History Main Topics  . Smoking status: Never Smoker   . Smokeless tobacco: Not on file  . Alcohol Use: No  . Drug Use: No  . Sexual  Activity: Not on file   Other Topics Concern  . Not on file   Social History Narrative   Widowed. Accompanied by daughter today.   No illicit drug use. Never smoked. Does not drink alcohol.     Current outpatient prescriptions:  .  aspirin 81 MG tablet, Take 81 mg by mouth daily., Disp: , Rfl:  .  Cholecalciferol (VITAMIN D3) 5000 UNITS CAPS, Take by mouth daily., Disp: , Rfl:  .  LORazepam (ATIVAN) 0.5 MG tablet, Take 1 tablet (0.5 mg total) by mouth 2 (two) times daily as needed for anxiety., Disp: 60 tablet, Rfl: 2 .  losartan (COZAAR) 100 MG tablet, Take 1 tablet (100 mg total) by mouth daily., Disp: 90 tablet, Rfl: 0 .  nebivolol (BYSTOLIC) 10 MG tablet, Take 10 mg by mouth daily., Disp: , Rfl:  .  simvastatin (ZOCOR) 20 MG tablet, Take 20 mg by mouth daily., Disp: , Rfl:   Allergies  Allergen Reactions  . Penicillins   . Sulfa Antibiotics      Review of Systems  Eyes: Negative for blurred vision.  Respiratory: Negative for shortness of breath.   Cardiovascular: Negative for chest pain and palpitations.  Neurological: Negative for headaches.  Psychiatric/Behavioral: The patient is nervous/anxious. The patient does not have insomnia.       Objective  Filed Vitals:   12/08/14 1541  BP: 124/71  Pulse: 68  Temp: 98.5 F (36.9 C)  TempSrc: Oral  Resp: 17  Height: 5\' 1"  (1.549 m)  Weight: 105 lb 12.8 oz (47.991 kg)  SpO2: 96%    Physical Exam  Constitutional: She is oriented to person, place, and time and well-developed, well-nourished, and in no distress.  HENT:  Head: Normocephalic and atraumatic.  Eyes: Pupils are equal, round, and reactive to light.  Cardiovascular: Normal rate and regular rhythm.   Pulmonary/Chest: Effort normal and breath sounds normal.  Musculoskeletal: She exhibits no edema.  Neurological: She is alert and oriented to person, place, and time.  Skin: Skin is warm and dry.  Psychiatric: Mood, memory, affect and judgment normal.   Nursing note and vitals reviewed.     Assessment & Plan 1. Essential hypertension Blood pressure well controlled on present antihypertensive therapy. Refills provided. - losartan (COZAAR) 100 MG tablet; Take 1 tablet (100 mg total) by mouth daily.  Dispense: 90 tablet; Refill: 0  2. Generalized anxiety disorder In terms of anxiety are responsive to Ativan 0.5 mgdaily when necessary. Patient understands the tolerance potential of benzodiazepines. Refills provided and follow-up in 3 months - LORazepam (ATIVAN) 0.5 MG tablet; Take 1 tablet (0.5 mg total) by mouth 2 (two) times daily as needed for anxiety.  Dispense: 60 tablet; Refill: 2    [name] [name] Faylene KurtzShah [facility] 12/08/2014 4:41 PM

## 2015-03-02 ENCOUNTER — Telehealth: Payer: Self-pay | Admitting: Family Medicine

## 2015-03-02 NOTE — Telephone Encounter (Signed)
Pt needs refill on Bystolic to be sent to [facility]

## 2015-03-03 MED ORDER — NEBIVOLOL HCL 10 MG PO TABS: 10.0000 mg | ORAL_TABLET | Freq: Every day | ORAL | Status: DC

## 2015-03-03 NOTE — Telephone Encounter (Signed)
Medication has been refilled and sent to [facility] [address] 

## 2015-03-07 ENCOUNTER — Emergency Department
Admission: EM | Admit: 2015-03-07 | Discharge: 2015-03-07 | Disposition: A | Discharge status: Home / Self Care | Payer: Medicare Other | Attending: Emergency Medicine | Admitting: Emergency Medicine

## 2015-03-07 DIAGNOSIS — Z7982 Long term (current) use of aspirin: Secondary | ICD-10-CM | POA: Insufficient documentation

## 2015-03-07 DIAGNOSIS — Z880 Allergy status to penicillin: Secondary | ICD-10-CM | POA: Diagnosis not present

## 2015-03-07 DIAGNOSIS — J029 Acute pharyngitis, unspecified: Secondary | ICD-10-CM | POA: Diagnosis present

## 2015-03-07 DIAGNOSIS — Z79899 Other long term (current) drug therapy: Secondary | ICD-10-CM | POA: Insufficient documentation

## 2015-03-07 DIAGNOSIS — I10 Essential (primary) hypertension: Secondary | ICD-10-CM | POA: Diagnosis not present

## 2015-03-07 DIAGNOSIS — J312 Chronic pharyngitis: Secondary | ICD-10-CM | POA: Diagnosis not present

## 2015-03-07 MED ORDER — DOXYCYCLINE HYCLATE 50 MG PO CAPS: 100.0000 mg | ORAL_CAPSULE | Freq: Two times a day (BID) | ORAL | Status: DC

## 2015-03-07 NOTE — ED Provider Notes (Signed)
[facility] Emergency Department Provider Note  ____________________________________________  Time seen: On arrival  I have reviewed the triage vital signs and the nursing notes.   HISTORY  Chief Complaint Sore Throat    HPI Tamara Sparks is a 81 y.o. female who complains of sore throat for approximately 6 months. She describes it as mild and irritating and sometimes worse in the morning. Today she checked her temperature and her temperature was 104 but in the emergency department is normal. She has a history of high blood pressure which takes medications. She does admit to being somewhat anxious today. She denies difficulty swallowing. No difficult to breathing. She has an ENT appointment in 1 week. She does admit to some sinus drainage and does have a history of acid reflux as well    Past Medical History  Diagnosis Date  . Urinary tract infection, site not specified   . Unspecified essential hypertension   . Cardiac dysrhythmia, unspecified   . Anxiety state, unspecified   . Unspecified vitamin D deficiency   . Dyslipidemia, goal LDL below 130   . Hyposmolality and/or hyponatremia   . History of stroke     Right cerebellar intracerebral hemorrhage  . Stroke     Patient Active Problem List   Diagnosis Date Noted  . Anxiety disorder 12/08/2014  . Rapid heart beat 01/23/2014  . Diastolic murmur 01/23/2014  . Essential hypertension 01/23/2014    Past Surgical History  Procedure Laterality Date  . Cardiac catheterization      [facility]  . Lobectomy Right 1940s  . External ear surgery Left     Current Outpatient Rx  Name  Route  Sig  Dispense  Refill  . aspirin 81 MG tablet   Oral   Take 81 mg by mouth daily.         . Cholecalciferol (VITAMIN D3) 5000 UNITS CAPS   Oral   Take by mouth daily.         Marland Kitchen. LORazepam (ATIVAN) 0.5 MG tablet   Oral   Take 1 tablet (0.5 mg total) by mouth 2 (two) times daily as needed for anxiety.   60  tablet   2   . losartan (COZAAR) 100 MG tablet   Oral   Take 1 tablet (100 mg total) by mouth daily.   90 tablet   0   . nebivolol (BYSTOLIC) 10 MG tablet   Oral   Take 1 tablet (10 mg total) by mouth daily.   30 tablet   0   . simvastatin (ZOCOR) 20 MG tablet   Oral   Take 20 mg by mouth daily.           Allergies Penicillins and Sulfa antibiotics  Family History  Problem Relation Age of Onset  . Stroke Mother   . Hypertension Father   . Stroke Father     Social History Social History  Substance Use Topics  . Smoking status: Never Smoker   . Smokeless tobacco: Not on file  . Alcohol Use: No    Review of Systems  Constitutional: Positive for fever Eyes: Negative for visual changes. ENT: Positive for sore throat  Genitourinary: Negative for dysuria. Negative for foul smelling urine Musculoskeletal: Negative for back pain. Skin: Negative for rash. Neurological: Negative for headaches or focal weakness   ____________________________________________   PHYSICAL EXAM:  VITAL SIGNS: ED Triage Vitals  Enc Vitals Group     BP 03/07/15 1647 204/58 mmHg  Pulse Rate 03/07/15 1647 69     Resp 03/07/15 1647 18     Temp 03/07/15 1647 98.5 F (36.9 C)     Temp Source 03/07/15 1647 Oral     SpO2 03/07/15 1647 96 %     Weight 03/07/15 1647 110 lb (49.896 kg)     Height 03/07/15 1647  (1.549 m)     Head Cir --      Peak Flow --      Pain Score 03/07/15 1648 9     Pain Loc --      Pain Edu? --      Excl. in GC? --      Constitutional: Alert and oriented. Well appearing and in no distress. Eyes: Conjunctivae are normal.  ENT   Head: Normocephalic and atraumatic.   Mouth/Throat: Mucous membranes are moist. Pharynx is completely normal Cardiovascular: Normal rate, regular rhythm.  Respiratory: Normal respiratory effort without tachypnea nor retractions.  Gastrointestinal: Soft and non-tender in all quadrants. No distention. There is no CVA  tenderness. Musculoskeletal: Nontender with normal range of motion in all extremities. Neurologic:  Normal speech and language. No gross focal neurologic deficits are appreciated. Skin:  Skin is warm, dry and intact. No rash noted. Psychiatric: Mood and affect are normal. Patient exhibits appropriate insight and judgment.  ____________________________________________    LABS (pertinent positives/negatives)  Labs Reviewed - No data to display  ____________________________________________     ____________________________________________    RADIOLOGY I have personally reviewed any xrays that were ordered on this patient: None ____________________________________________   PROCEDURES  Procedure(s) performed: none   ____________________________________________   INITIAL IMPRESSION / ASSESSMENT AND PLAN / ED COURSE  Pertinent labs & imaging results that were available during my care of the patient were reviewed by me and considered in my medical decision making (see chart for details).  Patient very well-appearing and in no distress. Her temperature in the emergency department is normal. She did not take any Tylenol or Motrin prior to arrival. Her exam is completely benign. She does have elevated blood pressure agrees to follow up with her PCP for recheck and medication adjustment as needed. She does not have shortness of breath or cough and no dysuria. She is not interested in having chest x-ray or urine checked as she is certain that this is related to her sore throat. We will prescribe her antibiotics for possible sinusitis. Strict return precautions given  ____________________________________________   FINAL CLINICAL IMPRESSION(S) / ED DIAGNOSES  Final diagnoses:  Sore throat, chronic     Jene Every, MD 03/07/15 1718

## 2015-03-07 NOTE — ED Notes (Signed)
AAOx3.  Skin warm and dry.  NAD 

## 2015-03-07 NOTE — Discharge Instructions (Signed)

## 2015-03-07 NOTE — ED Notes (Signed)
Pt states that she has had a sore throat with some hoarseness for a long time, pt states that she started running a fever yesterday, pt states that today her temp was 104 an hour and a half pta, didn't take any anti fever meds pta and temp is within normal range at this time

## 2015-03-10 ENCOUNTER — Ambulatory Visit: Payer: Medicare Other | Admitting: Family Medicine

## 2015-03-16 ENCOUNTER — Encounter: Payer: Self-pay | Admitting: Family Medicine

## 2015-03-16 ENCOUNTER — Ambulatory Visit (INDEPENDENT_AMBULATORY_CARE_PROVIDER_SITE_OTHER): Payer: Medicare Other | Admitting: Family Medicine

## 2015-03-16 VITALS — BP 126/80 | HR 69 | Temp 97.5°F | Resp 16 | Ht 61.0 in | Wt 105.6 lb

## 2015-03-16 DIAGNOSIS — Z23 Encounter for immunization: Secondary | ICD-10-CM

## 2015-03-16 DIAGNOSIS — I10 Essential (primary) hypertension: Secondary | ICD-10-CM

## 2015-03-16 DIAGNOSIS — F411 Generalized anxiety disorder: Secondary | ICD-10-CM | POA: Diagnosis not present

## 2015-03-16 DIAGNOSIS — E785 Hyperlipidemia, unspecified: Secondary | ICD-10-CM

## 2015-03-16 MED ORDER — LOSARTAN POTASSIUM 100 MG PO TABS: 100.0000 mg | ORAL_TABLET | Freq: Every day | ORAL | Status: DC

## 2015-03-16 MED ORDER — SIMVASTATIN 20 MG PO TABS: 20.0000 mg | ORAL_TABLET | Freq: Every day | ORAL | Status: DC

## 2015-03-16 MED ORDER — NEBIVOLOL HCL 10 MG PO TABS: 10.0000 mg | ORAL_TABLET | Freq: Every day | ORAL | Status: DC

## 2015-03-16 MED ORDER — LORAZEPAM 0.5 MG PO TABS: 0.5000 mg | ORAL_TABLET | Freq: Two times a day (BID) | ORAL | Status: DC | PRN

## 2015-03-16 NOTE — Progress Notes (Signed)
Name: Tamara Sparks   MRN: [id number]    DOB: 1929-01-25   Date:03/16/2015       Progress Note  Subjective  Chief Complaint  Chief Complaint  Patient presents with  . Medication Refill    3 month F/U and labs  . Hypertension  . Hyperlipidemia  . Anxiety    Well controll with medication    Hypertension Associated symptoms include anxiety. Pertinent negatives include no chest pain, headaches, palpitations or shortness of breath. Past treatments include beta blockers and angiotensin blockers. Hypertensive end-organ damage includes CVA. There is no history of CAD/MI.  Hyperlipidemia This is a chronic problem. The problem is controlled. Pertinent negatives include no chest pain or shortness of breath. Current antihyperlipidemic treatment includes statins.  Anxiety Presents for follow-up visit. Symptoms include excessive worry and nervous/anxious behavior. Patient reports no chest pain, palpitations or shortness of breath.   Past treatments include benzodiazephines. The treatment provided significant relief. Compliance with prior treatments has been good.    Past Medical History  Diagnosis Date  . Urinary tract infection, site not specified   . Unspecified essential hypertension   . Cardiac dysrhythmia, unspecified   . Anxiety state, unspecified   . Unspecified vitamin D deficiency   . Dyslipidemia, goal LDL below 130   . Hyposmolality and/or hyponatremia   . History of stroke     Right cerebellar intracerebral hemorrhage  . Stroke [facility](HCC)     Past Surgical History  Procedure Laterality Date  . Cardiac catheterization      [facility]  . Lobectomy Right 1940s  . External ear surgery Left     Family History  Problem Relation Age of Onset  . Stroke Mother   . Hypertension Father   . Stroke Father     Social History   Social History  . Marital Status: Widowed    Spouse Name: N/A  . Number of Children: N/A  . Years of Education: N/A   Occupational History  . Not on  file.   Social History Main Topics  . Smoking status: Never Smoker   . Smokeless tobacco: Never Used  . Alcohol Use: No  . Drug Use: No  . Sexual Activity: Not Currently   Other Topics Concern  . Not on file   Social History Narrative   Widowed. Accompanied by daughter today.   No illicit drug use. Never smoked. Does not drink alcohol.     Current outpatient prescriptions:  .  aspirin 81 MG tablet, Take 81 mg by mouth daily., Disp: , Rfl:  .  Cholecalciferol (VITAMIN D3) 5000 UNITS CAPS, Take by mouth daily., Disp: , Rfl:  .  doxycycline (VIBRAMYCIN) 50 MG capsule, Take 2 capsules (100 mg total) by mouth 2 (two) times daily., Disp: 28 capsule, Rfl: 0 .  LORazepam (ATIVAN) 0.5 MG tablet, Take 1 tablet (0.5 mg total) by mouth 2 (two) times daily as needed for anxiety., Disp: 60 tablet, Rfl: 2 .  losartan (COZAAR) 100 MG tablet, Take 1 tablet (100 mg total) by mouth daily., Disp: 90 tablet, Rfl: 0 .  nebivolol (BYSTOLIC) 10 MG tablet, Take 1 tablet (10 mg total) by mouth daily., Disp: 30 tablet, Rfl: 0 .  simvastatin (ZOCOR) 20 MG tablet, Take 20 mg by mouth daily., Disp: , Rfl:   Allergies  Allergen Reactions  . Penicillins   . Sulfa Antibiotics      Review of Systems  Respiratory: Negative for shortness of breath.   Cardiovascular: Negative for chest pain  and palpitations.  Neurological: Negative for headaches.  Psychiatric/Behavioral: The patient is nervous/anxious.     Objective  Filed Vitals:   03/16/15 0900  BP: 126/80  Pulse: 69  Temp: 97.5 F (36.4 C)  TempSrc: Oral  Resp: 16  Height:  (1.549 m)  Weight: 105 lb 9.6 oz (47.9 kg)  SpO2: 97%    Physical Exam  Constitutional: She is oriented to person, place, and time and well-developed, well-nourished, and in no distress.  HENT:  Head: Normocephalic and atraumatic.  Cardiovascular: Normal rate, regular rhythm and normal heart sounds.   Pulmonary/Chest: Effort normal and breath sounds normal. She has  no wheezes.  Abdominal: Soft. Bowel sounds are normal.  Neurological: She is alert and oriented to person, place, and time.  Skin: Skin is warm and dry.  Nursing note and vitals reviewed.   Assessment & Plan  1. Needs flu shot  - Flu vaccine HIGH DOSE PF (Fluzone High dose)  2. Essential hypertension  - losartan (COZAAR) 100 MG tablet; Take 1 tablet (100 mg total) by mouth daily.  Dispense: 90 tablet; Refill: 0 - nebivolol (BYSTOLIC) 10 MG tablet; Take 1 tablet (10 mg total) by mouth daily.  Dispense: 90 tablet; Refill: 0  3. Generalized anxiety disorder  - LORazepam (ATIVAN) 0.5 MG tablet; Take 1 tablet (0.5 mg total) by mouth 2 (two) times daily as needed for anxiety.  Dispense: 60 tablet; Refill: 2  4. Dyslipidemia, goal LDL below 130 - simvastatin (ZOCOR) 20 MG tablet; Take 1 tablet (20 mg total) by mouth daily.  Dispense: 90 tablet; Refill: 0 - Lipid Profile - Comprehensive Metabolic Panel (CMET)    Jozey Janco [name] 03/16/2015 9:09 AM

## 2015-03-17 LAB — LIPID PANEL
CHOLESTEROL TOTAL: 174 mg/dL (ref 100–199)
Chol/HDL Ratio: 3.7 ratio units (ref 0.0–4.4)
HDL: 47 mg/dL (ref >39)
LDL Calculated: 102 mg/dL — ABNORMAL HIGH (ref 0–99)
TRIGLYCERIDES: 123 mg/dL (ref 0–149)
VLDL Cholesterol Cal: 25 mg/dL (ref 5–40)

## 2015-03-17 LAB — COMPREHENSIVE METABOLIC PANEL
A/G RATIO: 1.4 (ref 1.1–2.5)
ALT: 13 IU/L (ref 0–32)
AST: 18 IU/L (ref 0–40)
Albumin: 4.2 g/dL (ref 3.5–4.7)
Alkaline Phosphatase: 89 IU/L (ref 39–117)
BUN/Creatinine Ratio: 8 — ABNORMAL LOW (ref 11–26)
BUN: 8 mg/dL (ref 8–27)
Bilirubin Total: 0.5 mg/dL (ref 0.0–1.2)
CALCIUM: 10.2 mg/dL (ref 8.7–10.3)
CO2: 26 mmol/L (ref 18–29)
Chloride: 99 mmol/L (ref 97–106)
Creatinine, Ser: 0.95 mg/dL (ref 0.57–1.00)
GFR, EST AFRICAN AMERICAN: 64 mL/min/{1.73_m2} (ref >59)
GFR, EST NON AFRICAN AMERICAN: 55 mL/min/{1.73_m2} — AB (ref >59)
GLOBULIN, TOTAL: 3.1 g/dL (ref 1.5–4.5)
Glucose: 100 mg/dL — ABNORMAL HIGH (ref 65–99)
POTASSIUM: 4.9 mmol/L (ref 3.5–5.2)
SODIUM: 140 mmol/L (ref 136–144)
TOTAL PROTEIN: 7.3 g/dL (ref 6.0–8.5)

## 2015-03-19 DIAGNOSIS — H7012 Chronic mastoiditis, left ear: Secondary | ICD-10-CM | POA: Diagnosis not present

## 2015-03-19 DIAGNOSIS — H6123 Impacted cerumen, bilateral: Secondary | ICD-10-CM | POA: Diagnosis not present

## 2015-03-29 ENCOUNTER — Telehealth: Payer: Self-pay | Admitting: Family Medicine

## 2015-03-29 ENCOUNTER — Other Ambulatory Visit: Payer: Self-pay | Admitting: Family Medicine

## 2015-03-29 NOTE — Telephone Encounter (Signed)
Patient is completely out of Lorazepam. [facility] [address]

## 2015-03-29 NOTE — Telephone Encounter (Signed)
Medication refill has been refused due to it is to soon for request, last refill was on 03/16/2015 for a 3 month supply #90

## 2015-03-29 NOTE — Telephone Encounter (Signed)
Medication has been refilled on 03/16/2015 and there are 2 refills remaining patient has been notified she will call pharmacy and request refill

## 2015-06-16 ENCOUNTER — Ambulatory Visit (INDEPENDENT_AMBULATORY_CARE_PROVIDER_SITE_OTHER): Payer: Medicare Other | Admitting: Family Medicine

## 2015-06-16 ENCOUNTER — Encounter: Payer: Self-pay | Admitting: Family Medicine

## 2015-06-16 VITALS — BP 125/74 | HR 69 | Temp 98.1°F | Resp 18 | Ht 61.0 in | Wt 106.6 lb

## 2015-06-16 DIAGNOSIS — I10 Essential (primary) hypertension: Secondary | ICD-10-CM | POA: Diagnosis not present

## 2015-06-16 MED ORDER — NEBIVOLOL HCL 10 MG PO TABS: 10.0000 mg | ORAL_TABLET | Freq: Every day | ORAL | Status: DC

## 2015-06-16 MED ORDER — LOSARTAN POTASSIUM 100 MG PO TABS: 100.0000 mg | ORAL_TABLET | Freq: Every day | ORAL | Status: DC

## 2015-06-16 NOTE — Progress Notes (Signed)
Name: Tamara Sparks   MRN: [id number]    DOB: January 13, 1929   Date:06/16/2015       Progress Note  Subjective  Chief Complaint  Chief Complaint  Patient presents with  . Follow-up    3 mo  . Hyperlipidemia  . Hypertension  . Anxiety  . Medication Refill    losartan 100 mg / bystolic 10 mg    Hypertension This is a chronic problem. The problem is unchanged. The problem is controlled. Pertinent negatives include no blurred vision, chest pain or headaches. Past treatments include angiotensin blockers and beta blockers. There are no compliance problems.     Past Medical History  Diagnosis Date  . Urinary tract infection, site not specified   . Unspecified essential hypertension   . Cardiac dysrhythmia, unspecified   . Anxiety state, unspecified   . Unspecified vitamin D deficiency   . Dyslipidemia, goal LDL below 130   . Hyposmolality and/or hyponatremia   . History of stroke     Right cerebellar intracerebral hemorrhage  . Stroke [facility])     Past Surgical History  Procedure Laterality Date  . Cardiac catheterization      [facility]  . Lobectomy Right 1940s  . External ear surgery Left     Family History  Problem Relation Age of Onset  . Stroke Mother   . Hypertension Father   . Stroke Father     Social History   Social History  . Marital Status: Widowed    Spouse Name: N/A  . Number of Children: N/A  . Years of Education: N/A   Occupational History  . Not on file.   Social History Main Topics  . Smoking status: Never Smoker   . Smokeless tobacco: Never Used  . Alcohol Use: No  . Drug Use: No  . Sexual Activity: Not Currently   Other Topics Concern  . Not on file   Social History Narrative   Widowed. Accompanied by daughter today.   No illicit drug use. Never smoked. Does not drink alcohol.     Current outpatient prescriptions:  .  aspirin 81 MG tablet, Take 81 mg by mouth daily., Disp: , Rfl:  .  Cholecalciferol (VITAMIN D3) 5000 UNITS CAPS, Take by  mouth daily., Disp: , Rfl:  .  doxycycline (VIBRAMYCIN) 50 MG capsule, Take 2 capsules (100 mg total) by mouth 2 (two) times daily., Disp: 28 capsule, Rfl: 0 .  LORazepam (ATIVAN) 0.5 MG tablet, Take 1 tablet (0.5 mg total) by mouth 2 (two) times daily as needed for anxiety., Disp: 60 tablet, Rfl: 2 .  losartan (COZAAR) 100 MG tablet, Take 1 tablet (100 mg total) by mouth daily., Disp: 90 tablet, Rfl: 0 .  nebivolol (BYSTOLIC) 10 MG tablet, Take 1 tablet (10 mg total) by mouth daily., Disp: 90 tablet, Rfl: 0 .  simvastatin (ZOCOR) 20 MG tablet, Take 1 tablet (20 mg total) by mouth daily., Disp: 90 tablet, Rfl: 0  Allergies  Allergen Reactions  . Penicillins   . Sulfa Antibiotics      Review of Systems  Eyes: Negative for blurred vision.  Cardiovascular: Negative for chest pain.  Neurological: Negative for headaches.    Objective  Filed Vitals:   06/16/15 0842  BP: 125/74  Pulse: 69  Temp: 98.1 F (36.7 C)  TempSrc: Oral  Resp: 18  Height:  (1.549 m)  Weight: 106 lb 9.6 oz (48.353 kg)  SpO2: 97%    Physical Exam  Constitutional: She is  oriented to person, place, and time and well-developed, well-nourished, and in no distress.  HENT:  Head: Normocephalic and atraumatic.  Cardiovascular: Normal rate and regular rhythm.   Pulmonary/Chest: Effort normal and breath sounds normal.  Musculoskeletal: She exhibits no edema.  Neurological: She is alert and oriented to person, place, and time.  Nursing note and vitals reviewed.    Assessment & Plan  1. Essential hypertension Blood pressure responsive to antihypertensive therapy. Refills provided. - losartan (COZAAR) 100 MG tablet; Take 1 tablet (100 mg total) by mouth daily.  Dispense: 90 tablet; Refill: 1 - nebivolol (BYSTOLIC) 10 MG tablet; Take 1 tablet (10 mg total) by mouth daily.  Dispense: 90 tablet; Refill: 1   Annita Ratliff [name] 06/16/2015 9:00 AM

## 2015-08-13 ENCOUNTER — Other Ambulatory Visit: Payer: Self-pay | Admitting: Family Medicine

## 2015-09-09 ENCOUNTER — Ambulatory Visit (INDEPENDENT_AMBULATORY_CARE_PROVIDER_SITE_OTHER): Payer: Medicare Other | Admitting: Family Medicine

## 2015-09-09 ENCOUNTER — Encounter: Payer: Self-pay | Admitting: Family Medicine

## 2015-09-09 VITALS — BP 110/70 | HR 63 | Temp 99.0°F | Resp 16 | Ht 61.0 in | Wt 107.5 lb

## 2015-09-09 DIAGNOSIS — J302 Other seasonal allergic rhinitis: Secondary | ICD-10-CM | POA: Diagnosis not present

## 2015-09-09 MED ORDER — FLUTICASONE PROPIONATE 50 MCG/ACT NA SUSP: 2.0000 | Freq: Every day | NASAL | Status: DC

## 2015-09-09 NOTE — Progress Notes (Signed)
Name: Tamara Sparks   MRN: [id number]    DOB: May 15, 1929   Date:09/09/2015       Progress Note  Subjective  Chief Complaint  Chief Complaint  Patient presents with  . Labwork Recheck  . Allergies    HPI  Allergies: Pt. Presents for symptoms of allergies, including nasal congestion, cough with mucus, sneezing, and watery eyes. Symptoms present for 5 days.  Past Medical History  Diagnosis Date  . Urinary tract infection, site not specified   . Unspecified essential hypertension   . Cardiac dysrhythmia, unspecified   . Anxiety state, unspecified   . Unspecified vitamin D deficiency   . Dyslipidemia, goal LDL below 130   . Hyposmolality and/or hyponatremia   . History of stroke     Right cerebellar intracerebral hemorrhage  . Stroke [facility])     Past Surgical History  Procedure Laterality Date  . Cardiac catheterization      [facility]  . Lobectomy Right 1940s  . External ear surgery Left     Family History  Problem Relation Age of Onset  . Stroke Mother   . Hypertension Father   . Stroke Father     Social History   Social History  . Marital Status: Widowed    Spouse Name: N/A  . Number of Children: N/A  . Years of Education: N/A   Occupational History  . Not on file.   Social History Main Topics  . Smoking status: Never Smoker   . Smokeless tobacco: Never Used  . Alcohol Use: No  . Drug Use: No  . Sexual Activity: Not Currently   Other Topics Concern  . Not on file   Social History Narrative   Widowed. Accompanied by daughter today.   No illicit drug use. Never smoked. Does not drink alcohol.     Current outpatient prescriptions:  .  aspirin 81 MG tablet, Take 81 mg by mouth daily., Disp: , Rfl:  .  Cholecalciferol (VITAMIN D3) 5000 UNITS CAPS, Take by mouth daily., Disp: , Rfl:  .  doxycycline (VIBRAMYCIN) 50 MG capsule, Take 2 capsules (100 mg total) by mouth 2 (two) times daily., Disp: 28 capsule, Rfl: 0 .  LORazepam (ATIVAN) 0.5 MG tablet, Take 1  tablet (0.5 mg total) by mouth 2 (two) times daily as needed for anxiety., Disp: 60 tablet, Rfl: 2 .  losartan (COZAAR) 100 MG tablet, Take 1 tablet (100 mg total) by mouth daily., Disp: 90 tablet, Rfl: 1 .  nebivolol (BYSTOLIC) 10 MG tablet, Take 1 tablet (10 mg total) by mouth daily., Disp: 90 tablet, Rfl: 1 .  simvastatin (ZOCOR) 20 MG tablet, take 1 tablet by mouth once daily, Disp: 90 tablet, Rfl: 0  Allergies  Allergen Reactions  . Penicillins   . Sulfa Antibiotics      Review of Systems  HENT: Positive for sore throat.   Respiratory: Positive for cough.      Objective  Filed Vitals:   09/09/15 1344  BP: 110/70  Pulse: 63  Temp: 99 F (37.2 C)  TempSrc: Oral  Resp: 16  Height:  (1.549 m)  Weight: 107 lb 8 oz (48.762 kg)  SpO2: 96%    Physical Exam  Constitutional: She is well-developed, well-nourished, and in no distress.  HENT:  Mouth/Throat: Posterior oropharyngeal erythema present.  Nasal mucosa pink, turbinates mildly hypertrophied.  Cardiovascular: Normal rate, regular rhythm, S1 normal and S2 normal.   Murmur heard.  Diastolic murmur is present with a grade of  2/6  Pulmonary/Chest: Effort normal and breath sounds normal. She has no wheezes.  Nursing note and vitals reviewed.     Assessment & Plan  1. Seasonal allergies Advised to use Flonase and antihistamine. - fluticasone (FLONASE) 50 MCG/ACT nasal spray; Place 2 sprays into both nostrils daily.  Dispense: 16 g; Refill: 0   Nitzia Perren [name] 09/09/2015 1:53 PM

## 2015-09-14 ENCOUNTER — Ambulatory Visit: Payer: Medicare Other | Admitting: Family Medicine

## 2015-09-23 ENCOUNTER — Encounter: Payer: Self-pay | Admitting: Family Medicine

## 2015-09-23 ENCOUNTER — Ambulatory Visit (INDEPENDENT_AMBULATORY_CARE_PROVIDER_SITE_OTHER): Payer: Medicare Other | Admitting: Family Medicine

## 2015-09-23 VITALS — BP 124/72 | HR 62 | Temp 97.8°F | Resp 18 | Ht 61.0 in | Wt 107.2 lb

## 2015-09-23 DIAGNOSIS — R739 Hyperglycemia, unspecified: Secondary | ICD-10-CM | POA: Insufficient documentation

## 2015-09-23 DIAGNOSIS — E785 Hyperlipidemia, unspecified: Secondary | ICD-10-CM | POA: Diagnosis not present

## 2015-09-23 DIAGNOSIS — I10 Essential (primary) hypertension: Secondary | ICD-10-CM

## 2015-09-23 DIAGNOSIS — F411 Generalized anxiety disorder: Secondary | ICD-10-CM

## 2015-09-23 LAB — POCT GLYCOSYLATED HEMOGLOBIN (HGB A1C): Hemoglobin A1C: 5.5

## 2015-09-23 LAB — GLUCOSE, POCT (MANUAL RESULT ENTRY): POC Glucose: 92 mg/dl (ref 70–99)

## 2015-09-23 MED ORDER — LORAZEPAM 0.5 MG PO TABS: 0.5000 mg | ORAL_TABLET | Freq: Two times a day (BID) | ORAL | Status: DC | PRN

## 2015-09-23 MED ORDER — SIMVASTATIN 20 MG PO TABS: 20.0000 mg | ORAL_TABLET | Freq: Every day | ORAL | Status: DC

## 2015-09-23 MED ORDER — NEBIVOLOL HCL 10 MG PO TABS: 10.0000 mg | ORAL_TABLET | Freq: Every day | ORAL | Status: DC

## 2015-09-23 MED ORDER — LOSARTAN POTASSIUM 100 MG PO TABS: 100.0000 mg | ORAL_TABLET | Freq: Every day | ORAL | Status: DC

## 2015-09-23 NOTE — Progress Notes (Signed)
Name: Tamara Sparks   MRN: [id number]    DOB: Jul 14, 1928   Date:09/23/2015       Progress Note  Subjective  Chief Complaint  Chief Complaint  Patient presents with  . Hypertension    3 month follow up and fasting labs    Hypertension The problem is controlled. Associated symptoms include anxiety and blurred vision (intermittent blurred vision). Pertinent negatives include no chest pain, headaches, malaise/fatigue, palpitations or shortness of breath. Past treatments include beta blockers and angiotensin blockers. Hypertensive end-organ damage includes CVA. There is no history of CAD/MI.  Hyperlipidemia This is a chronic problem. The problem is controlled. Pertinent negatives include no chest pain, myalgias or shortness of breath. Current antihyperlipidemic treatment includes statins.  Anxiety Presents for follow-up visit. The problem has been unchanged. Symptoms include excessive worry and nervous/anxious behavior. Patient reports no chest pain, palpitations or shortness of breath.   Past treatments include benzodiazephines. The treatment provided significant relief. Compliance with prior treatments has been good.    Past Medical History  Diagnosis Date  . Urinary tract infection, site not specified   . Unspecified essential hypertension   . Cardiac dysrhythmia, unspecified   . Anxiety state, unspecified   . Unspecified vitamin D deficiency   . Dyslipidemia, goal LDL below 130   . Hyposmolality and/or hyponatremia   . History of stroke     Right cerebellar intracerebral hemorrhage  . Stroke [facility](HCC)     Past Surgical History  Procedure Laterality Date  . Cardiac catheterization      [facility]  . Lobectomy Right 1940s  . External ear surgery Left     Family History  Problem Relation Age of Onset  . Stroke Mother   . Hypertension Father   . Stroke Father     Social History   Social History  . Marital Status: Widowed    Spouse Name: N/A  . Number of Children: N/A  .  Years of Education: N/A   Occupational History  . Not on file.   Social History Main Topics  . Smoking status: Never Smoker   . Smokeless tobacco: Never Used  . Alcohol Use: No  . Drug Use: No  . Sexual Activity: Not Currently   Other Topics Concern  . Not on file   Social History Narrative   Widowed. Accompanied by daughter today.   No illicit drug use. Never smoked. Does not drink alcohol.     Current outpatient prescriptions:  .  aspirin 81 MG tablet, Take 81 mg by mouth daily., Disp: , Rfl:  .  Cholecalciferol (VITAMIN D3) 5000 UNITS CAPS, Take by mouth daily., Disp: , Rfl:  .  doxycycline (VIBRAMYCIN) 50 MG capsule, Take 2 capsules (100 mg total) by mouth 2 (two) times daily., Disp: 28 capsule, Rfl: 0 .  fluticasone (FLONASE) 50 MCG/ACT nasal spray, Place 2 sprays into both nostrils daily., Disp: 16 g, Rfl: 0 .  LORazepam (ATIVAN) 0.5 MG tablet, Take 1 tablet (0.5 mg total) by mouth 2 (two) times daily as needed for anxiety., Disp: 60 tablet, Rfl: 2 .  losartan (COZAAR) 100 MG tablet, Take 1 tablet (100 mg total) by mouth daily., Disp: 90 tablet, Rfl: 1 .  nebivolol (BYSTOLIC) 10 MG tablet, Take 1 tablet (10 mg total) by mouth daily., Disp: 90 tablet, Rfl: 1 .  simvastatin (ZOCOR) 20 MG tablet, take 1 tablet by mouth once daily, Disp: 90 tablet, Rfl: 0  Allergies  Allergen Reactions  . Penicillins   .  Sulfa Antibiotics      Review of Systems  Constitutional: Negative for fever, chills and malaise/fatigue.  Eyes: Positive for blurred vision (intermittent blurred vision). Negative for double vision.  Respiratory: Negative for shortness of breath.   Cardiovascular: Negative for chest pain and palpitations.  Gastrointestinal: Negative for abdominal pain.  Musculoskeletal: Negative for myalgias.  Neurological: Negative for headaches.  Psychiatric/Behavioral: The patient is nervous/anxious.     Objective  Filed Vitals:   09/23/15 0911  BP: 124/72  Pulse: 62   Temp: 97.8 F (36.6 C)  Resp: 18  Height:  (1.549 m)  Weight: 107 lb 3 oz (48.62 kg)  SpO2: 97%    Physical Exam  Constitutional: She is oriented to person, place, and time and well-developed, well-nourished, and in no distress.  HENT:  Head: Normocephalic and atraumatic.  Cardiovascular: Normal rate and regular rhythm.   Pulmonary/Chest: Effort normal and breath sounds normal.  Abdominal: Soft. Bowel sounds are normal.  Musculoskeletal: She exhibits no edema.  Neurological: She is alert and oriented to person, place, and time.  Psychiatric: Mood, memory, affect and judgment normal.  Nursing note and vitals reviewed.     Assessment & Plan  1. Essential hypertension Blood pressure stable on present antihypertensive therapy - losartan (COZAAR) 100 MG tablet; Take 1 tablet (100 mg total) by mouth daily.  Dispense: 90 tablet; Refill: 1 - nebivolol (BYSTOLIC) 10 MG tablet; Take 1 tablet (10 mg total) by mouth daily.  Dispense: 90 tablet; Refill: 1  2. Generalized anxiety disorder Stable, continue on benzodiazepine therapy as needed. Refills provided - LORazepam (ATIVAN) 0.5 MG tablet; Take 1 tablet (0.5 mg total) by mouth 2 (two) times daily as needed for anxiety.  Dispense: 60 tablet; Refill: 2  3. Dyslipidemia Obtain FLP and adjust as necessary. - simvastatin (ZOCOR) 20 MG tablet; Take 1 tablet (20 mg total) by mouth daily at 6 PM.  Dispense: 90 tablet; Refill: 1 - Lipid Profile - Comprehensive Metabolic Panel (CMET)  4. Hyperglycemia  - POCT HgB A1C - POCT Glucose (CBG)  Tamara Sparks [name] 09/23/2015 9:19 AM

## 2015-09-24 LAB — COMPREHENSIVE METABOLIC PANEL
ALBUMIN: 4.2 g/dL (ref 3.5–4.7)
ALK PHOS: 92 IU/L (ref 39–117)
ALT: 13 IU/L (ref 0–32)
AST: 20 IU/L (ref 0–40)
Albumin/Globulin Ratio: 1.6 (ref 1.2–2.2)
BILIRUBIN TOTAL: 0.6 mg/dL (ref 0.0–1.2)
BUN / CREAT RATIO: 7 — AB (ref 12–28)
BUN: 6 mg/dL — AB (ref 8–27)
CHLORIDE: 100 mmol/L (ref 96–106)
CO2: 26 mmol/L (ref 18–29)
Calcium: 9.5 mg/dL (ref 8.7–10.3)
Creatinine, Ser: 0.85 mg/dL (ref 0.57–1.00)
GFR calc non Af Amer: 63 mL/min/{1.73_m2} (ref >59)
GFR, EST AFRICAN AMERICAN: 73 mL/min/{1.73_m2} (ref >59)
GLOBULIN, TOTAL: 2.7 g/dL (ref 1.5–4.5)
GLUCOSE: 94 mg/dL (ref 65–99)
Potassium: 4.3 mmol/L (ref 3.5–5.2)
SODIUM: 141 mmol/L (ref 134–144)
TOTAL PROTEIN: 6.9 g/dL (ref 6.0–8.5)

## 2015-09-24 LAB — LIPID PANEL
CHOLESTEROL TOTAL: 145 mg/dL (ref 100–199)
Chol/HDL Ratio: 2.7 ratio units (ref 0.0–4.4)
HDL: 54 mg/dL (ref >39)
LDL Calculated: 68 mg/dL (ref 0–99)
TRIGLYCERIDES: 116 mg/dL (ref 0–149)
VLDL CHOLESTEROL CAL: 23 mg/dL (ref 5–40)

## 2015-10-19 ENCOUNTER — Other Ambulatory Visit: Payer: Self-pay | Admitting: Family Medicine

## 2015-10-25 ENCOUNTER — Telehealth: Payer: Self-pay | Admitting: Family Medicine

## 2015-10-25 NOTE — Telephone Encounter (Signed)
Patient had called stating that the pharmacy would not refill her rx for Lorazepam because Dr. Sherryll BurgerShah refused it because patient had already pick it up during her ov on 09/23/15.  [place]a called [facility] and they stated that patient did not bring in rx.  I informed patient that she was suppose to take the hard copy rx of Lorazepam to the pharmacy in order to get it refilled.  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

## 2016-01-25 ENCOUNTER — Other Ambulatory Visit: Payer: Self-pay | Admitting: Family Medicine

## 2016-01-25 DIAGNOSIS — F411 Generalized anxiety disorder: Secondary | ICD-10-CM

## 2016-01-31 ENCOUNTER — Encounter: Payer: Self-pay | Admitting: Family Medicine

## 2016-01-31 ENCOUNTER — Ambulatory Visit (INDEPENDENT_AMBULATORY_CARE_PROVIDER_SITE_OTHER): Payer: Medicare Other | Admitting: Family Medicine

## 2016-01-31 DIAGNOSIS — I10 Essential (primary) hypertension: Secondary | ICD-10-CM | POA: Diagnosis not present

## 2016-01-31 DIAGNOSIS — F411 Generalized anxiety disorder: Secondary | ICD-10-CM | POA: Diagnosis not present

## 2016-01-31 MED ORDER — LOSARTAN POTASSIUM 100 MG PO TABS: 100.0000 mg | ORAL_TABLET | Freq: Every day | ORAL | 1 refills | Status: DC

## 2016-01-31 MED ORDER — LORAZEPAM 0.5 MG PO TABS: 0.5000 mg | ORAL_TABLET | Freq: Two times a day (BID) | ORAL | 2 refills | Status: DC | PRN

## 2016-01-31 NOTE — Progress Notes (Signed)
Name: Tamara Sparks   MRN: [id number]    DOB: 1928/07/29   Date:01/31/2016       Progress Note  Subjective  Chief Complaint  Chief Complaint  Patient presents with  . Anxiety    medication refills  . Hypertension    Anxiety  Presents for follow-up visit. Symptoms include excessive worry and nervous/anxious behavior. Patient reports no chest pain, insomnia, palpitations, restlessness or shortness of breath. The severity of symptoms is moderate. The quality of sleep is good.    Hypertension  This is a chronic problem. The problem is unchanged. Associated symptoms include anxiety. Pertinent negatives include no chest pain, headaches, palpitations, shortness of breath or sweats. Past treatments include angiotensin blockers and beta blockers. Hypertensive end-organ damage includes CAD/MI and CVA. There is no history of kidney disease.    Past Medical History:  Diagnosis Date  . Anxiety state, unspecified   . Cardiac dysrhythmia, unspecified   . Dyslipidemia, goal LDL below 130   . History of stroke    Right cerebellar intracerebral hemorrhage  . Hyposmolality and/or hyponatremia   . Stroke (HCC)   . Unspecified essential hypertension   . Unspecified vitamin D deficiency   . Urinary tract infection, site not specified     Past Surgical History:  Procedure Laterality Date  . CARDIAC CATHETERIZATION     [facility]  . EXTERNAL EAR SURGERY Left   . LOBECTOMY Right 1940s    Family History  Problem Relation Age of Onset  . Stroke Mother   . Hypertension Father   . Stroke Father     Social History   Social History  . Marital status: Widowed    Spouse name: N/A  . Number of children: N/A  . Years of education: N/A   Occupational History  . Not on file.   Social History Main Topics  . Smoking status: Never Smoker  . Smokeless tobacco: Never Used  . Alcohol use No  . Drug use: No  . Sexual activity: Not Currently   Other Topics Concern  . Not on file   Social  History Narrative   Widowed. Accompanied by daughter today.   No illicit drug use. Never smoked. Does not drink alcohol.     Current Outpatient Prescriptions:  .  aspirin 81 MG tablet, Take 81 mg by mouth daily., Disp: , Rfl:  .  Cholecalciferol (VITAMIN D3) 5000 UNITS CAPS, Take by mouth daily., Disp: , Rfl:  .  fluticasone (FLONASE) 50 MCG/ACT nasal spray, Place 2 sprays into both nostrils daily., Disp: 16 g, Rfl: 0 .  LORazepam (ATIVAN) 0.5 MG tablet, Take 1 tablet (0.5 mg total) by mouth 2 (two) times daily as needed for anxiety., Disp: 60 tablet, Rfl: 2 .  losartan (COZAAR) 100 MG tablet, Take 1 tablet (100 mg total) by mouth daily., Disp: 90 tablet, Rfl: 1 .  nebivolol (BYSTOLIC) 10 MG tablet, Take 1 tablet (10 mg total) by mouth daily., Disp: 90 tablet, Rfl: 1 .  simvastatin (ZOCOR) 20 MG tablet, Take 1 tablet (20 mg total) by mouth daily at 6 PM., Disp: 90 tablet, Rfl: 1  Allergies  Allergen Reactions  . Penicillins   . Sulfa Antibiotics     Review of Systems  Constitutional: Negative for chills and fever.  Respiratory: Negative for shortness of breath.   Cardiovascular: Negative for chest pain and palpitations.  Neurological: Negative for headaches.  Psychiatric/Behavioral: Positive for depression. The patient is nervous/anxious. The patient does not have insomnia.  Objective  Vitals:   01/31/16 1134  BP: 120/60  Pulse: 74  Resp: 14  Temp: 98.8 F (37.1 C)  TempSrc: Oral  SpO2: 96%  Weight: 110 lb 14.4 oz (50.3 kg)  Height: 5\' 1"  (1.549 m)    Physical Exam  Constitutional: She is oriented to person, place, and time and well-developed, well-nourished, and in no distress.  HENT:  Head: Normocephalic and atraumatic.  Cardiovascular: Normal rate and regular rhythm.   No murmur heard. Pulmonary/Chest: Effort normal and breath sounds normal. She has no wheezes.  Abdominal: Soft. Bowel sounds are normal.  Musculoskeletal: She exhibits no edema.   Neurological: She is alert and oriented to person, place, and time.  Psychiatric: Mood, memory, affect and judgment normal.  Nursing note and vitals reviewed.     Assessment & Plan  1. Essential hypertension  - losartan (COZAAR) 100 MG tablet; Take 1 tablet (100 mg total) by mouth daily.  Dispense: 90 tablet; Refill: 1  2. Generalized anxiety disorder Symptoms stable on lorazepam 0.5 mg taken twice daily as needed. Refills provided - LORazepam (ATIVAN) 0.5 MG tablet; Take 1 tablet (0.5 mg total) by mouth 2 (two) times daily as needed for anxiety.  Dispense: 60 tablet; Refill: 2   Kimbly Eanes [name] 01/31/2016 11:45 AM

## 2016-02-28 DIAGNOSIS — H35373 Puckering of macula, bilateral: Secondary | ICD-10-CM | POA: Diagnosis not present

## 2016-03-03 DIAGNOSIS — H2513 Age-related nuclear cataract, bilateral: Secondary | ICD-10-CM | POA: Diagnosis not present

## 2016-03-06 ENCOUNTER — Encounter: Payer: Self-pay | Admitting: *Deleted

## 2016-03-07 NOTE — H&P (Signed)
See scanned note.

## 2016-03-08 ENCOUNTER — Ambulatory Visit: Payer: Medicare Other | Admitting: Certified Registered Nurse Anesthetist

## 2016-03-08 ENCOUNTER — Ambulatory Visit
Admission: RE | Admit: 2016-03-08 | Discharge: 2016-03-08 | Disposition: A | Discharge status: Home / Self Care | Payer: Medicare Other | Source: Ambulatory Visit | Attending: Ophthalmology | Admitting: Ophthalmology

## 2016-03-08 ENCOUNTER — Encounter: Payer: Self-pay | Admitting: *Deleted

## 2016-03-08 ENCOUNTER — Encounter
Admission: RE | Disposition: A | Discharge status: Home / Self Care | Payer: Self-pay | Source: Ambulatory Visit | Attending: Ophthalmology

## 2016-03-08 DIAGNOSIS — H2513 Age-related nuclear cataract, bilateral: Secondary | ICD-10-CM | POA: Diagnosis not present

## 2016-03-08 DIAGNOSIS — E7800 Pure hypercholesterolemia, unspecified: Secondary | ICD-10-CM | POA: Diagnosis not present

## 2016-03-08 DIAGNOSIS — F329 Major depressive disorder, single episode, unspecified: Secondary | ICD-10-CM | POA: Diagnosis not present

## 2016-03-08 DIAGNOSIS — I6930 Unspecified sequelae of cerebral infarction: Secondary | ICD-10-CM | POA: Insufficient documentation

## 2016-03-08 DIAGNOSIS — H2511 Age-related nuclear cataract, right eye: Secondary | ICD-10-CM | POA: Insufficient documentation

## 2016-03-08 DIAGNOSIS — F419 Anxiety disorder, unspecified: Secondary | ICD-10-CM | POA: Diagnosis not present

## 2016-03-08 DIAGNOSIS — I10 Essential (primary) hypertension: Secondary | ICD-10-CM | POA: Insufficient documentation

## 2016-03-08 DIAGNOSIS — Z79899 Other long term (current) drug therapy: Secondary | ICD-10-CM | POA: Insufficient documentation

## 2016-03-08 HISTORY — PX: CATARACT EXTRACTION W/PHACO: SHX586

## 2016-03-08 HISTORY — DX: Cardiac murmur, unspecified: R01.1

## 2016-03-08 HISTORY — DX: Dyspnea, unspecified: R06.00

## 2016-03-08 HISTORY — DX: Unspecified hearing loss, unspecified ear: H91.90

## 2016-03-08 HISTORY — DX: Cardiac arrhythmia, unspecified: I49.9

## 2016-03-08 HISTORY — DX: Respiratory tuberculosis unspecified: A15.9

## 2016-03-08 HISTORY — DX: Tremor, unspecified: R25.1

## 2016-03-08 HISTORY — DX: Depression, unspecified: F32.A

## 2016-03-08 HISTORY — DX: Major depressive disorder, single episode, unspecified: F32.9

## 2016-03-08 SURGERY — PHACOEMULSIFICATION, CATARACT, WITH IOL INSERTION
Anesthesia: General | Laterality: Right | Wound class: Clean

## 2016-03-08 MED ORDER — CYCLOPENTOLATE HCL 2 % OP SOLN
OPHTHALMIC | Status: AC
  Administered 2016-03-08: 1 [drp] via OPHTHALMIC
  Filled 2016-03-08: qty 2

## 2016-03-08 MED ORDER — NA CHONDROIT SULF-NA HYALURON 40-17 MG/ML IO SOLN
INTRAOCULAR | Status: DC | PRN
  Administered 2016-03-08: 1 mL via INTRAOCULAR

## 2016-03-08 MED ORDER — CYCLOPENTOLATE HCL 2 % OP SOLN
1.0000 [drp] | OPHTHALMIC | Status: AC
  Administered 2016-03-08 (×4): 1 [drp] via OPHTHALMIC

## 2016-03-08 MED ORDER — EPINEPHRINE PF 1 MG/ML IJ SOLN
INTRAMUSCULAR | Status: DC | PRN
  Administered 2016-03-08: 250 mL via OPHTHALMIC

## 2016-03-08 MED ORDER — LIDOCAINE HCL (PF) 4 % IJ SOLN
INTRAOCULAR | Status: DC | PRN
  Administered 2016-03-08: .5 mL via OPHTHALMIC

## 2016-03-08 MED ORDER — POVIDONE-IODINE 5 % OP SOLN
OPHTHALMIC | Status: DC | PRN
  Administered 2016-03-08: 1 via OPHTHALMIC

## 2016-03-08 MED ORDER — LABETALOL HCL 5 MG/ML IV SOLN
INTRAVENOUS | Status: DC | PRN
  Administered 2016-03-08: 10 mg via INTRAVENOUS

## 2016-03-08 MED ORDER — HYALURONIDASE HUMAN 150 UNIT/ML IJ SOLN
INTRAMUSCULAR | Status: AC
  Filled 2016-03-08: qty 1

## 2016-03-08 MED ORDER — NA CHONDROIT SULF-NA HYALURON 40-17 MG/ML IO SOLN
INTRAOCULAR | Status: AC
  Filled 2016-03-08: qty 1

## 2016-03-08 MED ORDER — PHENYLEPHRINE HCL 10 % OP SOLN
OPHTHALMIC | Status: AC
  Administered 2016-03-08: 1 [drp] via OPHTHALMIC
  Filled 2016-03-08: qty 5

## 2016-03-08 MED ORDER — BUPIVACAINE HCL (PF) 0.75 % IJ SOLN
INTRAMUSCULAR | Status: DC | PRN
  Administered 2016-03-08: 5 mL via OPHTHALMIC

## 2016-03-08 MED ORDER — MIDAZOLAM HCL 2 MG/2ML IJ SOLN
INTRAMUSCULAR | Status: DC | PRN
  Administered 2016-03-08 (×2): 0.5 mg via INTRAVENOUS

## 2016-03-08 MED ORDER — MOXIFLOXACIN HCL 0.5 % OP SOLN
OPHTHALMIC | Status: DC | PRN
  Administered 2016-03-08: 1 [drp] via OPHTHALMIC

## 2016-03-08 MED ORDER — MOXIFLOXACIN HCL 0.5 % OP SOLN
1.0000 [drp] | OPHTHALMIC | Status: DC
  Administered 2016-03-08 (×3): 1 [drp] via OPHTHALMIC

## 2016-03-08 MED ORDER — BUPIVACAINE HCL (PF) 0.75 % IJ SOLN
INTRAMUSCULAR | Status: AC
  Filled 2016-03-08: qty 10

## 2016-03-08 MED ORDER — TETRACAINE HCL 0.5 % OP SOLN
OPHTHALMIC | Status: AC
  Filled 2016-03-08: qty 2

## 2016-03-08 MED ORDER — CARBACHOL 0.01 % IO SOLN
INTRAOCULAR | Status: DC | PRN
  Administered 2016-03-08: 0.5 mL via INTRAOCULAR

## 2016-03-08 MED ORDER — ALFENTANIL 500 MCG/ML IJ INJ
INJECTION | INTRAMUSCULAR | Status: DC | PRN
  Administered 2016-03-08: 150 ug via INTRAVENOUS
  Administered 2016-03-08: 350 ug via INTRAVENOUS

## 2016-03-08 MED ORDER — EPINEPHRINE PF 1 MG/ML IJ SOLN
INTRAMUSCULAR | Status: AC
  Filled 2016-03-08: qty 2

## 2016-03-08 MED ORDER — SODIUM CHLORIDE 0.9 % IV SOLN
INTRAVENOUS | Status: DC
  Administered 2016-03-08: 50 mL/h via INTRAVENOUS

## 2016-03-08 MED ORDER — PROPARACAINE HCL 0.5 % OP SOLN: OPHTHALMIC | Status: DC | PRN

## 2016-03-08 MED ORDER — POVIDONE-IODINE 5 % OP SOLN
OPHTHALMIC | Status: AC
  Filled 2016-03-08: qty 30

## 2016-03-08 MED ORDER — CEFUROXIME OPHTHALMIC INJECTION 1 MG/0.1 ML
INJECTION | OPHTHALMIC | Status: AC
  Filled 2016-03-08: qty 0.1

## 2016-03-08 MED ORDER — TETRACAINE HCL 0.5 % OP SOLN
OPHTHALMIC | Status: DC | PRN
  Administered 2016-03-08: 1 [drp] via OPHTHALMIC

## 2016-03-08 MED ORDER — MOXIFLOXACIN HCL 0.5 % OP SOLN
OPHTHALMIC | Status: AC
  Administered 2016-03-08: 1 [drp] via OPHTHALMIC
  Filled 2016-03-08: qty 3

## 2016-03-08 MED ORDER — LIDOCAINE HCL (PF) 4 % IJ SOLN
INTRAMUSCULAR | Status: AC
  Filled 2016-03-08: qty 5

## 2016-03-08 MED ORDER — PHENYLEPHRINE HCL 10 % OP SOLN
1.0000 [drp] | OPHTHALMIC | Status: DC
  Administered 2016-03-08 (×4): 1 [drp] via OPHTHALMIC

## 2016-03-08 SURGICAL SUPPLY — 30 items
CANNULA ANT/CHMB 27GA (MISCELLANEOUS) ×3 IMPLANT
CORD BIP STRL DISP 12FT (MISCELLANEOUS) ×3 IMPLANT
CUP MEDICINE 2OZ PLAST GRAD ST (MISCELLANEOUS) ×3 IMPLANT
DRAPE XRAY CASSETTE 23X24 (DRAPES) ×3 IMPLANT
ERASER HMR WETFIELD 18G (MISCELLANEOUS) ×3 IMPLANT
GLOVE BIO SURGEON STRL SZ8 (GLOVE) ×3 IMPLANT
GLOVE SURG LX 6.5 MICRO (GLOVE) ×2
GLOVE SURG LX 8.0 MICRO (GLOVE) ×2
GLOVE SURG LX STRL 6.5 MICRO (GLOVE) ×1 IMPLANT
GLOVE SURG LX STRL 8.0 MICRO (GLOVE) ×1 IMPLANT
GOWN STRL REUS W/ TWL LRG LVL3 (GOWN DISPOSABLE) ×1 IMPLANT
GOWN STRL REUS W/ TWL XL LVL3 (GOWN DISPOSABLE) ×1 IMPLANT
GOWN STRL REUS W/TWL LRG LVL3 (GOWN DISPOSABLE) ×2
GOWN STRL REUS W/TWL XL LVL3 (GOWN DISPOSABLE) ×2
LENS IOL ACRSF IQ ULTRA 24.0 (Intraocular Lens) ×1 IMPLANT
LENS IOL ACRYSOF IQ 24.0 (Intraocular Lens) ×3 IMPLANT
PACK CATARACT (MISCELLANEOUS) ×3 IMPLANT
PACK CATARACT DINGLEDEIN LX (MISCELLANEOUS) ×3 IMPLANT
PACK EYE AFTER SURG (MISCELLANEOUS) ×3 IMPLANT
SHLD EYE VISITEC  UNIV (MISCELLANEOUS) ×3 IMPLANT
SOL BSS BAG (MISCELLANEOUS) ×3
SOL PREP PVP 2OZ (MISCELLANEOUS) ×3
SOLUTION BSS BAG (MISCELLANEOUS) ×1 IMPLANT
SOLUTION PREP PVP 2OZ (MISCELLANEOUS) ×1 IMPLANT
SUT SILK 5-0 (SUTURE) ×3 IMPLANT
SYR 3ML LL SCALE MARK (SYRINGE) ×3 IMPLANT
SYR 5ML LL (SYRINGE) ×3 IMPLANT
SYR TB 1ML 27GX1/2 LL (SYRINGE) ×3 IMPLANT
WATER STERILE IRR 250ML POUR (IV SOLUTION) ×3 IMPLANT
WIPE NON LINTING 3.25X3.25 (MISCELLANEOUS) ×3 IMPLANT

## 2016-03-08 NOTE — Discharge Instructions (Addendum)
Eye Surgery Discharge Instructions  Expect mild scratchy sensation or mild soreness. DO NOT RUB YOUR EYE!  The day of surgery:  Minimal physical activity, but bed rest is not required  No reading, computer work, or close hand work  No bending, lifting, or straining.  May watch TV  For 24 hours:  No driving, legal decisions, or alcoholic beverages  Safety precautions  Eat anything you prefer: It is better to start with liquids, then soup then solid foods.  __X___ Eye patch should be worn until postoperative exam tomorrow.  ____ Solar shield eyeglasses should be worn for comfort in the sunlight/patch while sleeping  Resume all regular medications including aspirin or Coumadin if these were discontinued prior to surgery. You may shower, bathe, shave, or wash your hair. Tylenol may be taken for mild discomfort.  Call your doctor if you experience significant pain, nausea, or vomiting, fever > 101 or other signs of infection. [phone] or [phone] Specific instructions:  Follow-up Information    Nelli Swalley, MD Follow up on 03/09/2016.   Specialty:  Ophthalmology Why:  9:45 AM Contact information: [address]   [place] [id number] [phone]

## 2016-03-08 NOTE — OR Nursing (Signed)
Contacted Dr. Noralyn Pickarroll to notify him of patients repeated  Blood pressures that were taken on  Both arms with several cuffs.  Patient is very anxious about these numbers as this was why she previously had a stroke.  Patient took her blood pressure medicines at 6:30 am today.  Dr. Noralyn Pickarroll said to proceed and the blood pressure would be taken care of during the procedure

## 2016-03-08 NOTE — Anesthesia Procedure Notes (Signed)
Performed by: Emry Tobin Pre-anesthesia Checklist: Patient identified, Emergency Drugs available, Patient being monitored, Suction available and Timeout performed Patient Re-evaluated:Patient Re-evaluated prior to inductionOxygen Delivery Method: Nasal cannula Intubation Type: IV induction       

## 2016-03-08 NOTE — Anesthesia Postprocedure Evaluation (Signed)
Anesthesia Post Note  Patient: Tamara Sparks  Procedure(s) Performed: Procedure(s) (LRB): CATARACT EXTRACTION PHACO AND INTRAOCULAR LENS PLACEMENT (IOC) (Right)  Patient location during evaluation: PACU Anesthesia Type: General Level of consciousness: awake and alert and oriented Pain management: pain level controlled Vital Signs Assessment: post-procedure vital signs reviewed and stable Respiratory status: spontaneous breathing Cardiovascular status: blood pressure returned to baseline Anesthetic complications: no    Last Vitals:  Vitals:   03/08/16 1013 03/08/16 1026  BP: (!) 176/47 (!) 169/56  Pulse: 71 67  Resp: 16 16  Temp: 36.8 C 37.1 C    Last Pain:  Vitals:   03/08/16 1026  TempSrc: Temporal                 Troy Hartzog

## 2016-03-08 NOTE — Interval H&P Note (Signed)
History and Physical Interval Note:  03/08/2016 9:14 AM  Tamara HoltsLouise B Knipple  has presented today for surgery, with the diagnosis of cataract  The various methods of treatment have been discussed with the patient and family. After consideration of risks, benefits and other options for treatment, the patient has consented to  Procedure(s): CATARACT EXTRACTION PHACO AND INTRAOCULAR LENS PLACEMENT (IOC) (Right) as a surgical intervention .  The patient's history has been reviewed, patient examined, no change in status, stable for surgery.  I have reviewed the patient's chart and labs.  Questions were answered to the patient's satisfaction.     Rickie Gutierres

## 2016-03-08 NOTE — Transfer of Care (Signed)
Immediate Anesthesia Transfer of Care Note  Patient: Tamara Sparks  Procedure(s) Performed: Procedure(s) with comments: CATARACT EXTRACTION PHACO AND INTRAOCULAR LENS PLACEMENT (IOC) (Right) -  FLUID CASSETTE [id number] H, EXP 07/19/17 US    3:16.8   AP%   27.2 CDE 94.14  Patient Location: PACU  Anesthesia Type:General  Level of Consciousness: awake, alert  and oriented  Airway & Oxygen Therapy: Patient Spontanous Breathing  Post-op Assessment: Report given to RN and Post -op Vital signs reviewed and stable  Post vital signs: Reviewed and stable  Last Vitals:  Vitals:   03/08/16 0909 03/08/16 0919  BP: (!) 193/50 102/85  Pulse: 64 67  Resp:  16  Temp:      Last Pain:  Vitals:   03/08/16 0744  TempSrc: Oral      Patients Stated Pain Goal: 0 (03/08/16 0744)  Complications: No apparent anesthesia complications

## 2016-03-08 NOTE — Anesthesia Preprocedure Evaluation (Signed)
Anesthesia Evaluation  Patient identified by MRN, date of birth, ID band Patient awake    Reviewed: Allergy & Precautions, NPO status , Patient's Chart, lab work & pertinent test results, reviewed documented beta blocker date and time   Airway Mallampati: II       Dental  (+) Poor Dentition   Pulmonary shortness of breath and with exertion,    Pulmonary exam normal        Cardiovascular hypertension, Pt. on medications and Pt. on home beta blockers Normal cardiovascular exam+ dysrhythmias + Valvular Problems/Murmurs      Neuro/Psych Anxiety Depression CVA, Residual Symptoms    GI/Hepatic negative GI ROS, Neg liver ROS,   Endo/Other  negative endocrine ROS  Renal/GU negative Renal ROS  negative genitourinary   Musculoskeletal negative musculoskeletal ROS (+)   Abdominal Normal abdominal exam  (+)   Peds negative pediatric ROS (+)  Hematology negative hematology ROS (+)   Anesthesia Other Findings   Reproductive/Obstetrics                             Anesthesia Physical Anesthesia Plan  ASA: II  Anesthesia Plan: General   Post-op Pain Management:    Induction: Inhalational  Airway Management Planned: Nasal ETT  Additional Equipment:   Intra-op Plan:   Post-operative Plan: Extubation in OR  Informed Consent: I have reviewed the patients History and Physical, chart, labs and discussed the procedure including the risks, benefits and alternatives for the proposed anesthesia with the patient or authorized representative who has indicated his/her understanding and acceptance.   Dental advisory given  Plan Discussed with: CRNA and Surgeon  Anesthesia Plan Comments:         Anesthesia Quick Evaluation

## 2016-03-08 NOTE — Op Note (Signed)
Date of Surgery: 03/08/2016 Date of Dictation: 03/08/2016 10:06 AM Pre-operative Diagnosis:  Nuclear Sclerotic Cataract right Eye Post-operative Diagnosis: same Procedure performed: Extra-capsular Cataract Extraction (ECCE) with placement of a posterior chamber intraocular lens (IOL) right Eye IOL:  Implant Name Type Inv. Item Serial No. Manufacturer Lot No. LRB No. Used  LENS IOL ACRYSOF IQ 24.0 - [id number] Intraocular Lens LENS IOL ACRYSOF IQ 24.0 [id number] 177 ALCON   Right 1   Anesthesia: 2% Lidocaine and 4% Marcaine in a 50/50 mixture with 10 unites/ml of Hylenex given as a peribulbar Anesthesiologist: Anesthesiologist: Yves DillPaul Carroll, MD CRNA: Malva Coganatherine Beane, CRNA Complications: none Estimated Blood Loss: less than 1 ml  Description of procedure:  The patient was given anesthesia and sedation via intravenous access. The patient was then prepped and draped in the usual fashion. A 25-gauge needle was bent for initiating the capsulorhexis. A 5-0 silk suture was placed through the conjunctiva superior and inferiorly to serve as bridle sutures. Hemostasis was obtained at the superior limbus using an eraser cautery. A partial thickness groove was made at the anterior surgical limbus with a 64 Beaver blade and this was dissected anteriorly with an SYSCOlcon Crescent knife. The anterior chamber was entered at 10 o'clock with a 1.0 mm paracentesis knife and through the lamellar dissection with a 2.6 mm Alcon keratome. Epi-Shugarcaine 0.5 CC [9 cc BSS Plus (Alcon), 3 cc 4% preservative-free lidocaine (Hospira) and 4 cc 1:1000 preservative-free, bisulfite-free epinephrine] was injected into the anterior chamber via the paracentesis tract. Epi-Shugarcaine 0.5 CC [9 cc BSS Plus (Alcon), 3 cc 4% preservative-free lidocaine (Hospira) and 4 cc 1:1000 preservative-free, bisulfite-free epinephrine] was injected into the anterior chamber via the paracentesis tract. DiscoVisc was injected to replace the aqueous and  a continuous tear curvilinear capsulorhexis was performed using a bent 25-gauge needle.  Balance salt on a syringe was used to perform hydro-dissection and phacoemulsification was carried out using a divide and conquer technique. Procedure(s) with comments: CATARACT EXTRACTION PHACO AND INTRAOCULAR LENS PLACEMENT (IOC) (Right) -  FLUID CASSETTE [id number] H, EXP 07/19/17 US       AP% CDE. Irrigation/aspiration was used to remove the residual cortex and the capsular bag was inflated with DiscoVisc. The intraocular lens was inserted into the capsular bag using a pre-loaded UltraSert Delivery System. Irrigation/aspiration was used to remove the residual DiscoVisc. The wound was inflated with balanced salt and checked for leaks. None were found. Miostat was injected via the paracentesis track and 0.1 ml of Vigamox containing 1 mg of drug  was injected via the paracentesis track. The wound was checked for leaks again and none were found.   The bridal sutures were removed and two drops of Vigamox were placed on the eye. An eye shield was placed to protect the eye and the patient was discharged to the recovery area in good condition.   Vickye Astorino MD

## 2016-03-13 ENCOUNTER — Ambulatory Visit (INDEPENDENT_AMBULATORY_CARE_PROVIDER_SITE_OTHER): Payer: Medicare Other | Admitting: Family Medicine

## 2016-03-13 ENCOUNTER — Encounter: Payer: Self-pay | Admitting: Family Medicine

## 2016-03-13 VITALS — BP 144/76 | HR 70 | Temp 98.0°F | Resp 16 | Ht 61.0 in | Wt 108.3 lb

## 2016-03-13 DIAGNOSIS — I10 Essential (primary) hypertension: Secondary | ICD-10-CM | POA: Diagnosis not present

## 2016-03-13 DIAGNOSIS — Z23 Encounter for immunization: Secondary | ICD-10-CM

## 2016-03-13 NOTE — Progress Notes (Signed)
Name: Tamara Sparks   MRN: [id number]    DOB: May 25, 1976   Date:03/13/2016       Progress Note  Subjective  Chief Complaint  Chief Complaint  Patient presents with  . Hypertension    bp was elevated when having cataract surgery 200/78 recheck 196/78    Hypertension  This is a chronic problem. The problem is unchanged. The problem is uncontrolled (BP was elevated before Cataract surgery last week, then again at her home, today her BP is 124/62 on manual check.). Pertinent negatives include no blurred vision, chest pain, headaches, palpitations or shortness of breath. Past treatments include angiotensin blockers and beta blockers. Hypertensive end-organ damage includes CVA. There is no history of kidney disease or CAD/MI.     Past Medical History:  Diagnosis Date  . Anxiety state, unspecified   . Cardiac dysrhythmia, unspecified   . Depression   . Dyslipidemia, goal LDL below 130   . Dyspnea    DOE, patient does alot of work for living alone  . Dysrhythmia    patient unsure of this  . Heart murmur   . History of stroke    Right cerebellar intracerebral hemorrhage  . HOH (hard of hearing)   . Hyposmolality and/or hyponatremia   . Stroke [facility]) 2012   right hand shakes and can't write well  . Tremors of nervous system    RIGHT ARM  . Tuberculosis 1947   RIGHT UPPER LOBE EXCISION AGE 82  . Unspecified essential hypertension   . Unspecified vitamin D deficiency   . Urinary tract infection, site not specified     Past Surgical History:  Procedure Laterality Date  . CARDIAC CATHETERIZATION     [facility]  . CATARACT EXTRACTION W/PHACO Right 03/08/2016   Procedure: CATARACT EXTRACTION PHACO AND INTRAOCULAR LENS PLACEMENT (IOC);  Surgeon: Sallee LangeSteven Dingeldein, MD;  Location: [facility] ORS;  Service: Ophthalmology;  Laterality: Right;   FLUID CASSETTE [id number] H, EXP 07/19/17 [place]    3:16.8   AP%   27.2 CDE 94.14  . EXTERNAL EAR SURGERY Left   . LOBECTOMY Right 1952   had developed TB  when she was 17    Family History  Problem Relation Age of Onset  . Stroke Mother   . Hypertension Father   . Stroke Father     Social History   Social History  . Marital status: Widowed    Spouse name: N/A  . Number of children: N/A  . Years of education: N/A   Occupational History  . Not on file.   Social History Main Topics  . Smoking status: Never Smoker  . Smokeless tobacco: Never Used  . Alcohol use No  . Drug use: No  . Sexual activity: Not Currently   Other Topics Concern  . Not on file   Social History Narrative   Widowed. Accompanied by daughter today.   No illicit drug use. Never smoked. Does not drink alcohol.     Current Outpatient Prescriptions:  .  aspirin 81 MG tablet, Take 81 mg by mouth daily., Disp: , Rfl:  .  Cholecalciferol (VITAMIN D3) 5000 UNITS CAPS, Take by mouth daily., Disp: , Rfl:  .  fluticasone (FLONASE) 50 MCG/ACT nasal spray, Place 2 sprays into both nostrils daily., Disp: 16 g, Rfl: 0 .  LORazepam (ATIVAN) 0.5 MG tablet, Take 1 tablet (0.5 mg total) by mouth 2 (two) times daily as needed for anxiety., Disp: 60 tablet, Rfl: 2 .  losartan (COZAAR) 100 MG tablet, Take  1 tablet (100 mg total) by mouth daily., Disp: 90 tablet, Rfl: 1 .  nebivolol (BYSTOLIC) 10 MG tablet, Take 1 tablet (10 mg total) by mouth daily., Disp: 90 tablet, Rfl: 1 .  simvastatin (ZOCOR) 20 MG tablet, Take 1 tablet (20 mg total) by mouth daily at 6 PM., Disp: 90 tablet, Rfl: 1  Allergies  Allergen Reactions  . Penicillins Rash  . Sulfa Antibiotics Other (See Comments)    Pt does not remember but thinks that it just didn't agree with her     Review of Systems  Eyes: Negative for blurred vision.  Respiratory: Negative for shortness of breath.   Cardiovascular: Negative for chest pain and palpitations.  Neurological: Negative for headaches.    Objective  Vitals:   03/13/16 1457  BP: (!) 144/76  Pulse: 70  Resp: 16  Temp: 98 F (36.7 C)  TempSrc:  Oral  SpO2: 98%  Weight: 108 lb 4.8 oz (49.1 kg)  Height: 5\' 1"  (1.549 m)    Physical Exam  Constitutional: She is oriented to person, place, and time and well-developed, well-nourished, and in no distress.  HENT:  Head: Normocephalic and atraumatic.  Cardiovascular: Normal rate, regular rhythm and normal heart sounds.   No murmur heard. Pulmonary/Chest: Effort normal and breath sounds normal. She has no wheezes.  Abdominal: Soft. Bowel sounds are normal. There is no tenderness.  Neurological: She is alert and oriented to person, place, and time.  Psychiatric: Mood, memory, affect and judgment normal.  Nursing note and vitals reviewed.    Assessment & Plan  1. Essential hypertension Blood pressure elevated in office at 144/76 mmHg, on repeat it is improved to 124/66 mmHg, advised to check BP at bedtime for 2 weeks and bring logs for review.  2. Need for influenza vaccination  - Flu vaccine HIGH DOSE PF (Fluzone High dose)  Masyn Fullam [name] [facility] 03/13/2016 3:30 PM

## 2016-03-27 ENCOUNTER — Ambulatory Visit: Payer: Medicare Other | Admitting: Family Medicine

## 2016-04-19 DIAGNOSIS — Z961 Presence of intraocular lens: Secondary | ICD-10-CM | POA: Diagnosis not present

## 2016-05-02 ENCOUNTER — Ambulatory Visit: Payer: Medicare Other | Admitting: Family Medicine

## 2016-06-15 ENCOUNTER — Other Ambulatory Visit: Payer: Self-pay | Admitting: Family Medicine

## 2016-06-15 DIAGNOSIS — I10 Essential (primary) hypertension: Secondary | ICD-10-CM

## 2016-06-20 ENCOUNTER — Ambulatory Visit: Payer: Medicare Other | Admitting: Family Medicine

## 2016-07-10 ENCOUNTER — Encounter: Payer: Self-pay | Admitting: Family Medicine

## 2016-07-10 ENCOUNTER — Ambulatory Visit (INDEPENDENT_AMBULATORY_CARE_PROVIDER_SITE_OTHER): Payer: Medicare Other | Admitting: Family Medicine

## 2016-07-10 DIAGNOSIS — I10 Essential (primary) hypertension: Secondary | ICD-10-CM | POA: Diagnosis not present

## 2016-07-10 DIAGNOSIS — F411 Generalized anxiety disorder: Secondary | ICD-10-CM | POA: Diagnosis not present

## 2016-07-10 DIAGNOSIS — E785 Hyperlipidemia, unspecified: Secondary | ICD-10-CM | POA: Diagnosis not present

## 2016-07-10 LAB — COMPLETE METABOLIC PANEL WITH GFR
ALBUMIN: 4.2 g/dL (ref 3.6–5.1)
ALK PHOS: 83 U/L (ref 33–130)
ALT: 11 U/L (ref 6–29)
AST: 19 U/L (ref 10–35)
BILIRUBIN TOTAL: 0.7 mg/dL (ref 0.2–1.2)
BUN: 13 mg/dL (ref 7–25)
CALCIUM: 10 mg/dL (ref 8.6–10.4)
CO2: 30 mmol/L (ref 20–31)
Chloride: 104 mmol/L (ref 98–110)
Creat: 0.96 mg/dL — ABNORMAL HIGH (ref 0.60–0.88)
GFR, EST NON AFRICAN AMERICAN: 54 mL/min — AB (ref >=60)
GFR, Est African American: 62 mL/min (ref >=60)
Glucose, Bld: 92 mg/dL (ref 65–99)
POTASSIUM: 4.8 mmol/L (ref 3.5–5.3)
Sodium: 140 mmol/L (ref 135–146)
TOTAL PROTEIN: 6.9 g/dL (ref 6.1–8.1)

## 2016-07-10 LAB — LIPID PANEL
CHOL/HDL RATIO: 2.6 ratio (ref <5.0)
CHOLESTEROL: 160 mg/dL (ref <200)
HDL: 61 mg/dL (ref >50)
LDL Cholesterol: 82 mg/dL (ref <100)
TRIGLYCERIDES: 87 mg/dL (ref <150)
VLDL: 17 mg/dL (ref <30)

## 2016-07-10 MED ORDER — SIMVASTATIN 20 MG PO TABS: 20.0000 mg | ORAL_TABLET | Freq: Every day | ORAL | 1 refills | Status: DC

## 2016-07-10 MED ORDER — LOSARTAN POTASSIUM 100 MG PO TABS: 100.0000 mg | ORAL_TABLET | Freq: Every day | ORAL | 1 refills | Status: DC

## 2016-07-10 MED ORDER — LORAZEPAM 0.5 MG PO TABS: 0.5000 mg | ORAL_TABLET | Freq: Two times a day (BID) | ORAL | 2 refills | Status: DC | PRN

## 2016-07-10 NOTE — Progress Notes (Signed)
Name: Tamara Sparks   MRN: [id number]    DOB: 1928/11/19   Date:07/10/2016       Progress Note  Subjective  Chief Complaint  Chief Complaint  Patient presents with  . Follow-up    3 month recheck and refills    Anxiety  Presents for follow-up visit. Symptoms include excessive worry and nervous/anxious behavior. Patient reports no chest pain, depressed mood, insomnia, irritability, palpitations, restlessness or shortness of breath. Primary symptoms comment: 'its my nerves!' she believes the having the stroke affected her nerves and now she takes Lorazepam once daily. The severity of symptoms is moderate. The quality of sleep is good.    Hypertension  This is a chronic problem. The problem is unchanged. Associated symptoms include anxiety. Pertinent negatives include no chest pain, headaches, palpitations, shortness of breath or sweats. Past treatments include angiotensin blockers and beta blockers. Hypertensive end-organ damage includes CAD/MI and CVA. There is no history of kidney disease.  Hyperlipidemia  This is a chronic problem. The problem is controlled. Recent lipid tests were reviewed and are normal. Pertinent negatives include no chest pain or shortness of breath. Current antihyperlipidemic treatment includes statins.    Past Medical History:  Diagnosis Date  . Anxiety state, unspecified   . Cardiac dysrhythmia, unspecified   . Depression   . Dyslipidemia, goal LDL below 130   . Dyspnea    DOE, patient does alot of work for living alone  . Dysrhythmia    patient unsure of this  . Heart murmur   . History of stroke    Right cerebellar intracerebral hemorrhage  . HOH (hard of hearing)   . Hyposmolality and/or hyponatremia   . Stroke [facility] 2012   right hand shakes and can't write well  . Tremors of nervous system    RIGHT ARM  . Tuberculosis 1947   RIGHT UPPER LOBE EXCISION AGE 56  . Unspecified essential hypertension   . Unspecified vitamin D deficiency   .  Urinary tract infection, site not specified     Past Surgical History:  Procedure Laterality Date  . CARDIAC CATHETERIZATION     [facility]  . CATARACT EXTRACTION W/PHACO Right 03/08/2016   Procedure: CATARACT EXTRACTION PHACO AND INTRAOCULAR LENS PLACEMENT (IOC);  Surgeon: Sallee Lange, MD;  Location: [facility] ORS;  Service: Ophthalmology;  Laterality: Right;   FLUID CASSETTE [id number] H, EXP 07/19/17 [place]    3:16.8   AP%   27.2 CDE 94.14  . EXTERNAL EAR SURGERY Left   . LOBECTOMY Right 1952   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17    Family History  Problem Relation Age of Onset  . Stroke Mother   . Hypertension Father   . Stroke Father     Social History   Social History  . Marital status: Widowed    Spouse name: N/A  . Number of children: N/A  . Years of education: N/A   Occupational History  . Not on file.   Social History Main Topics  . Smoking status: Never Smoker  . Smokeless tobacco: Never Used  . Alcohol use No  . Drug use: No  . Sexual activity: Not Currently   Other Topics Concern  . Not on file   Social History Narrative   Widowed. Accompanied by daughter today.   No illicit drug use. Never smoked. Does not drink alcohol.     Current Outpatient Prescriptions:  .  aspirin 81 MG tablet, Take 81 mg by mouth daily., Disp: , Rfl:  .  BYSTOLIC 10 MG tablet, take 1 tablet by mouth daily, Disp: 90 tablet, Rfl: 1 .  Cholecalciferol (VITAMIN D3) 5000 UNITS CAPS, Take by mouth daily., Disp: , Rfl:  .  fluticasone (FLONASE) 50 MCG/ACT nasal spray, Place 2 sprays into both nostrils daily., Disp: 16 g, Rfl: 0 .  LORazepam (ATIVAN) 0.5 MG tablet, Take 1 tablet (0.5 mg total) by mouth 2 (two) times daily as needed for anxiety., Disp: 60 tablet, Rfl: 2 .  losartan (COZAAR) 100 MG tablet, Take 1 tablet (100 mg total) by mouth daily., Disp: 90 tablet, Rfl: 1 .  simvastatin (ZOCOR) 20 MG tablet, Take 1 tablet (20 mg total) by mouth daily at 6 PM., Disp: 90 tablet, Rfl:  1  Allergies  Allergen Reactions  . Penicillins Rash  . Sulfa Antibiotics Other (See Comments)    Pt does not remember but thinks that it just didn't agree with her     Review of Systems  Constitutional: Negative for irritability.  Respiratory: Negative for shortness of breath.   Cardiovascular: Negative for chest pain and palpitations.  Neurological: Negative for headaches.  Psychiatric/Behavioral: The patient is nervous/anxious. The patient does not have insomnia.      Objective  Vitals:   07/10/16 0955  BP: 138/80  Pulse: 63  Resp: 16  Temp: 97.9 F (36.6 C)  TempSrc: Oral  SpO2: 98%  Weight: 111 lb (50.3 kg)  Height: 5\' 1"  (1.549 m)    Physical Exam  Constitutional: She is oriented to person, place, and time and well-developed, well-nourished, and in no distress.  Cardiovascular: Normal rate, regular rhythm and normal heart sounds.   No murmur heard. Pulmonary/Chest: Effort normal and breath sounds normal. She has no wheezes.  Abdominal: Soft. Bowel sounds are normal. There is no tenderness.  Musculoskeletal: She exhibits no edema.  Neurological: She is alert and oriented to person, place, and time.  Psychiatric: Mood, memory, affect and judgment normal.  Nursing note and vitals reviewed.    Assessment & Plan  1. Dyslipidemia Obtain FLP. Continue on statin - simvastatin (ZOCOR) 20 MG tablet; Take 1 tablet (20 mg total) by mouth daily at 6 PM.  Dispense: 90 tablet; Refill: 1 - Lipid panel - COMPLETE METABOLIC PANEL WITH GFR  2. Essential hypertension BP stable on present anti-hypertensive therapy. - losartan (COZAAR) 100 MG tablet; Take 1 tablet (100 mg total) by mouth daily.  Dispense: 90 tablet; Refill: 1  3. Generalized anxiety disorder Takes once or twice daily as needed, refills provided.   - LORazepam (ATIVAN) 0.5 MG tablet; Take 1 tablet (0.5 mg total) by mouth 2 (two) times daily as needed for anxiety.  Dispense: 60 tablet; Refill: 2   Charlean Carneal  [name] [facility] 07/10/2016 10:12 AM

## 2016-07-17 ENCOUNTER — Ambulatory Visit: Payer: Medicare Other

## 2016-10-11 ENCOUNTER — Other Ambulatory Visit: Payer: Self-pay | Admitting: Family Medicine

## 2016-10-11 DIAGNOSIS — F411 Generalized anxiety disorder: Secondary | ICD-10-CM

## 2016-10-20 ENCOUNTER — Ambulatory Visit (INDEPENDENT_AMBULATORY_CARE_PROVIDER_SITE_OTHER): Payer: Medicare Other | Admitting: Family Medicine

## 2016-10-20 ENCOUNTER — Encounter: Payer: Self-pay | Admitting: Family Medicine

## 2016-10-20 DIAGNOSIS — F411 Generalized anxiety disorder: Secondary | ICD-10-CM

## 2016-10-20 MED ORDER — LORAZEPAM 0.5 MG PO TABS: 0.5000 mg | ORAL_TABLET | Freq: Two times a day (BID) | ORAL | 2 refills | Status: DC | PRN

## 2016-10-20 NOTE — Progress Notes (Signed)
Name: Tamara Sparks   MRN: [id number]    DOB: Oct 19, 1928   Date:10/20/2016       Progress Note  Subjective  Chief Complaint  Chief Complaint  Patient presents with  . Medication Refill    Anxiety  Presents for follow-up visit. Symptoms include excessive worry, insomnia, muscle tension and nervous/anxious behavior. Patient reports no depressed mood, irritability or restlessness. Primary symptoms comment: 'its my nerves!' she believes the having the stroke affected her nerves and now she takes Lorazepam once daily. The severity of symptoms is moderate. The quality of sleep is good.       Past Medical History:  Diagnosis Date  . Anxiety state, unspecified   . Cardiac dysrhythmia, unspecified   . Depression   . Dyslipidemia, goal LDL below 130   . Dyspnea    DOE, patient does alot of work for living alone  . Dysrhythmia    patient unsure of this  . Heart murmur   . History of stroke    Right cerebellar intracerebral hemorrhage  . HOH (hard of hearing)   . Hyposmolality and/or hyponatremia   . Stroke [facility]) 2012   right hand shakes and can't write well  . Tremors of nervous system    RIGHT ARM  . Tuberculosis 1947   RIGHT UPPER LOBE EXCISION AGE 61  . Unspecified essential hypertension   . Unspecified vitamin D deficiency   . Urinary tract infection, site not specified     Past Surgical History:  Procedure Laterality Date  . CARDIAC CATHETERIZATION     MC  . CATARACT EXTRACTION W/PHACO Right 03/08/2016   Procedure: CATARACT EXTRACTION PHACO AND INTRAOCULAR LENS PLACEMENT (IOC);  Surgeon: Sallee Lange, MD;  Location: [facility] ORS;  Service: Ophthalmology;  Laterality: Right;   FLUID CASSETTE [id number] H, EXP 07/19/17 [place]    3:16.8   AP%   27.2 CDE 94.14  . EXTERNAL EAR SURGERY Left   . LOBECTOMY Right 1952   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17    Family History  Problem Relation Age of Onset  . Stroke Mother   . Hypertension Father   . Stroke Father     Social  History   Social History  . Marital status: Widowed    Spouse name: N/A  . Number of children: N/A  . Years of education: N/A   Occupational History  . Not on file.   Social History Main Topics  . Smoking status: Never Smoker  . Smokeless tobacco: Never Used  . Alcohol use No  . Drug use: No  . Sexual activity: Not Currently   Other Topics Concern  . Not on file   Social History Narrative   Widowed. Accompanied by daughter today.   No illicit drug use. Never smoked. Does not drink alcohol.     Current Outpatient Prescriptions:  .  aspirin 81 MG tablet, Take 81 mg by mouth daily., Disp: , Rfl:  .  BYSTOLIC 10 MG tablet, take 1 tablet by mouth daily, Disp: 90 tablet, Rfl: 1 .  Cholecalciferol (VITAMIN D3) 5000 UNITS CAPS, Take by mouth daily., Disp: , Rfl:  .  fluticasone (FLONASE) 50 MCG/ACT nasal spray, Place 2 sprays into both nostrils daily., Disp: 16 g, Rfl: 0 .  LORazepam (ATIVAN) 0.5 MG tablet, Take 1 tablet (0.5 mg total) by mouth 2 (two) times daily as needed for anxiety., Disp: 60 tablet, Rfl: 2 .  losartan (COZAAR) 100 MG tablet, Take 1 tablet (100 mg total) by mouth  daily., Disp: 90 tablet, Rfl: 1 .  simvastatin (ZOCOR) 20 MG tablet, Take 1 tablet (20 mg total) by mouth daily at 6 PM., Disp: 90 tablet, Rfl: 1  Allergies  Allergen Reactions  . Penicillins Rash  . Sulfa Antibiotics Other (See Comments)    Pt does not remember but thinks that it just didn't agree with her     Review of Systems  Constitutional: Negative for irritability.  Psychiatric/Behavioral: The patient is nervous/anxious and has insomnia.      Objective  Vitals:   10/20/16 1442  BP: 135/76  Pulse: 72  Resp: 16  Temp: 98.2 F (36.8 C)  TempSrc: Oral  SpO2: 96%  Weight: 111 lb 1.6 oz (50.4 kg)  Height: 5\' 1"  (1.549 m)    Physical Exam  Constitutional: She is oriented to person, place, and time and well-developed, well-nourished, and in no distress.  HENT:  Head:  Normocephalic and atraumatic.  Cardiovascular: Normal rate, regular rhythm and normal heart sounds.   No murmur heard. Pulmonary/Chest: Effort normal and breath sounds normal. She has no wheezes.  Neurological: She is alert and oriented to person, place, and time.  Psychiatric: Mood, memory, affect and judgment normal.  Nursing note and vitals reviewed.      Assessment & Plan  1. Generalized anxiety disorder Stable and responsive to Ativan taken twice daily as needed, refills provided - LORazepam (ATIVAN) 0.5 MG tablet; Take 1 tablet (0.5 mg total) by mouth 2 (two) times daily as needed for anxiety.  Dispense: 60 tablet; Refill: 2   Keilana Morlock [name] Faylene KurtzShah [facility] 10/20/2016 3:08 PM

## 2016-12-04 ENCOUNTER — Ambulatory Visit: Payer: Medicare Other

## 2016-12-18 ENCOUNTER — Ambulatory Visit (INDEPENDENT_AMBULATORY_CARE_PROVIDER_SITE_OTHER): Payer: Medicare Other | Admitting: Family Medicine

## 2016-12-18 ENCOUNTER — Encounter: Payer: Self-pay | Admitting: Family Medicine

## 2016-12-18 ENCOUNTER — Encounter: Payer: Self-pay | Admitting: Emergency Medicine

## 2016-12-18 ENCOUNTER — Emergency Department
Admission: EM | Admit: 2016-12-18 | Discharge: 2016-12-18 | Disposition: A | Discharge status: Home / Self Care | Payer: Medicare Other | Attending: Student in an Organized Health Care Education/Training Program | Admitting: Student in an Organized Health Care Education/Training Program

## 2016-12-18 VITALS — BP 218/74 | HR 68 | Temp 98.2°F | Resp 16 | Ht 61.0 in | Wt 111.2 lb

## 2016-12-18 DIAGNOSIS — Z8673 Personal history of transient ischemic attack (TIA), and cerebral infarction without residual deficits: Secondary | ICD-10-CM | POA: Diagnosis not present

## 2016-12-18 DIAGNOSIS — I160 Hypertensive urgency: Secondary | ICD-10-CM | POA: Diagnosis not present

## 2016-12-18 DIAGNOSIS — Z79899 Other long term (current) drug therapy: Secondary | ICD-10-CM | POA: Diagnosis not present

## 2016-12-18 DIAGNOSIS — I10 Essential (primary) hypertension: Secondary | ICD-10-CM | POA: Insufficient documentation

## 2016-12-18 DIAGNOSIS — Z7982 Long term (current) use of aspirin: Secondary | ICD-10-CM | POA: Insufficient documentation

## 2016-12-18 LAB — CBC WITH DIFFERENTIAL/PLATELET
Basophils Absolute: 0.1 10*3/uL (ref 0–0.1)
Basophils Relative: 1 %
EOS ABS: 0.2 10*3/uL (ref 0–0.7)
Eosinophils Relative: 2 %
HEMATOCRIT: 39.3 % (ref 35.0–47.0)
HEMOGLOBIN: 13.6 g/dL (ref 12.0–16.0)
LYMPHS ABS: 1 10*3/uL (ref 1.0–3.6)
LYMPHS PCT: 14 %
MCH: 30.3 pg (ref 26.0–34.0)
MCHC: 34.7 g/dL (ref 32.0–36.0)
MCV: 87.3 fL (ref 80.0–100.0)
MONOS PCT: 5 %
Monocytes Absolute: 0.4 10*3/uL (ref 0.2–0.9)
Neutro Abs: 5.9 10*3/uL (ref 1.4–6.5)
Neutrophils Relative %: 78 %
Platelets: 310 10*3/uL (ref 150–440)
RBC: 4.49 MIL/uL (ref 3.80–5.20)
RDW: 13.6 % (ref 11.5–14.5)
WBC: 7.5 10*3/uL (ref 3.6–11.0)

## 2016-12-18 LAB — TROPONIN I: Troponin I: <0.03 ng/mL (ref <0.03)

## 2016-12-18 LAB — BASIC METABOLIC PANEL
Anion gap: 8 (ref 5–15)
BUN: 10 mg/dL (ref 6–20)
CHLORIDE: 106 mmol/L (ref 101–111)
CO2: 26 mmol/L (ref 22–32)
CREATININE: 0.88 mg/dL (ref 0.44–1.00)
Calcium: 9.9 mg/dL (ref 8.9–10.3)
GFR calc Af Amer: >60 mL/min (ref >60)
GFR calc non Af Amer: 58 mL/min — ABNORMAL LOW (ref >60)
GLUCOSE: 115 mg/dL — AB (ref 65–99)
POTASSIUM: 4.8 mmol/L (ref 3.5–5.1)
Sodium: 140 mmol/L (ref 135–145)

## 2016-12-18 MED ORDER — AMLODIPINE BESYLATE 5 MG PO TABS
5.0000 mg | ORAL_TABLET | Freq: Once | ORAL | Status: AC
  Administered 2016-12-18: 5 mg via ORAL
  Filled 2016-12-18: qty 1

## 2016-12-18 MED ORDER — LORAZEPAM 0.5 MG PO TABS
0.5000 mg | ORAL_TABLET | Freq: Once | ORAL | Status: AC
  Administered 2016-12-18: 0.5 mg via ORAL
  Filled 2016-12-18: qty 1

## 2016-12-18 MED ORDER — AMLODIPINE BESYLATE 5 MG PO TABS: 5.0000 mg | ORAL_TABLET | Freq: Once | ORAL | Status: DC

## 2016-12-18 NOTE — ED Notes (Signed)
MD made aware pt's BP over 200 systolic, orders placed for Norvasc PO.

## 2016-12-18 NOTE — Discharge Instructions (Addendum)
From Dr. Shaune PollackLord:  Your exam and evaluation are reassuring in the emergency room today. Return to the emergency room immediately for any worsening condition clubbing chest pain, nausea, trouble breathing, dizziness, passing out, or any other symptoms concerning to you.  Please follow up with primary care doctor as well as cardiologist, Dr. Mariah MillingGollan.

## 2016-12-18 NOTE — ED Notes (Signed)
Pt and daughter given graham crackers and peanut butter per request with MD okay. Pt in noted to be resting in bed. This RN apologized for delay. Pt and daughter state understanding at this time.

## 2016-12-18 NOTE — ED Provider Notes (Signed)
[facility]  [facility] ____________________________________________    LABS (pertinent positives/negatives)  Labs Reviewed  BASIC METABOLIC PANEL - Abnormal; Notable for the following:       Result Value   Glucose, Bld 115 (*)    GFR calc non Af Amer 58 (*)    All other components within normal limits  TROPONIN I  CBC WITH DIFFERENTIAL/PLATELET  TROPONIN I       ____________________________________________   PROCEDURES  Procedure(s) performed: None  Critical Care performed: None  ____________________________________________   INITIAL IMPRESSION / ASSESSMENT AND PLAN / ED COURSE   Pertinent labs & imaging results that were available during my care of the patient were reviewed by me and considered in my medical decision making (see chart for details).  Patient was evaluated for elevated blood pressures and chest discomfort, pending repeat troponin. If negative, plan for discharge per plan discussed with Dr. Roxan Hockeyobinson at shift change.  Repeat troponin is negative. Will be discharged home to follow up with primary care doctor and cardiologist regarding ongoing blood pressure management, as well as follow-up for chest discomfort.  CONSULTATIONS: None    Patient / Family / Caregiver informed of clinical course, medical decision-making process, and agree with plan.   I discussed return precautions, follow-up instructions, and discharged instructions with patient and/or family.     ____________________________________________   FINAL CLINICAL IMPRESSION(S) / ED DIAGNOSES  Final diagnoses:  Hypertension, unspecified type        Governor RooksLord, Jillaine Waren, MD 12/23/16 [phone]
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
## 2016-12-18 NOTE — ED Notes (Signed)
NAD noted at time of D/C. Pt denies questions or concerns. Pt taken to the lobby via wheelchair at this time.  

## 2016-12-18 NOTE — ED Triage Notes (Addendum)
[facility] with c/o HTN. Pt denies any chest pain at this time. Pt is alert and oriented at this time. Pt states she lives alone and did take her HTN medications this morning, pt takes Bystolic 10mg  daily.

## 2016-12-18 NOTE — ED Notes (Signed)
Pt's daughter at bedside at this time.

## 2016-12-18 NOTE — ED Notes (Signed)
NAD noted at this time. Pt resting in bed with family at bedside. Pt denies any needs. Will continue to monitor for further patient needs.

## 2016-12-18 NOTE — ED Notes (Signed)
Patient ambulated to commode independently. Placed back on monitor. Warm blanket given.

## 2016-12-18 NOTE — Progress Notes (Signed)
Had to cancel patients apt due to high blood pressure readings. Pt was added to Dr. Margaretmary EddyShah's schedule as a work-in.   This encounter was created in error - please disregard.
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
## 2016-12-19 ENCOUNTER — Other Ambulatory Visit: Payer: Self-pay | Admitting: Family Medicine

## 2016-12-19 ENCOUNTER — Telehealth: Payer: Self-pay

## 2016-12-19 DIAGNOSIS — I10 Essential (primary) hypertension: Secondary | ICD-10-CM

## 2016-12-19 NOTE — Telephone Encounter (Signed)
Spoke to patient, she was very confused.  She needed to make an ED fu from 12/18/16 for hypertension issues she was having  She did not want to make an appointment yet. Wanted to call her provider before doing so. Attempted to see who her provider was she was not sure who she was going to call But she said once she talked to him she would call us back if needed.

## 2017-01-29 ENCOUNTER — Other Ambulatory Visit: Payer: Self-pay

## 2017-01-29 NOTE — Patient Outreach (Signed)
[facility] Care Management  01/29/2017  Festus HoltsLouise B Sparks May 02, 1929 [id number]   Medication Adherence call to Tamara Sparks the reason for this call is because Tamara Sparks is showing past due under [facility]. On her Simvastatin 20 mg spoke to patient she said she still has about a month of medication that she will need it at the end of the month     Lillia AbedAna Ollison-Moran CPhT Pharmacy Technician [facility] Care Management Direct Dial [phone]  Fax [phone] [email or web address]

## 2017-02-13 ENCOUNTER — Ambulatory Visit: Payer: Medicare Other | Admitting: Family Medicine

## 2017-02-19 DIAGNOSIS — H2512 Age-related nuclear cataract, left eye: Secondary | ICD-10-CM | POA: Diagnosis not present

## 2017-02-19 DIAGNOSIS — H40032 Anatomical narrow angle, left eye: Secondary | ICD-10-CM | POA: Diagnosis not present

## 2017-02-19 DIAGNOSIS — H35373 Puckering of macula, bilateral: Secondary | ICD-10-CM | POA: Diagnosis not present

## 2017-02-21 ENCOUNTER — Ambulatory Visit (INDEPENDENT_AMBULATORY_CARE_PROVIDER_SITE_OTHER): Payer: Medicare Other | Admitting: Family Medicine

## 2017-02-21 ENCOUNTER — Encounter: Payer: Self-pay | Admitting: Family Medicine

## 2017-02-21 VITALS — BP 140/80 | HR 62 | Ht 61.0 in | Wt 107.4 lb

## 2017-02-21 DIAGNOSIS — E785 Hyperlipidemia, unspecified: Secondary | ICD-10-CM | POA: Diagnosis not present

## 2017-02-21 DIAGNOSIS — I10 Essential (primary) hypertension: Secondary | ICD-10-CM | POA: Diagnosis not present

## 2017-02-21 DIAGNOSIS — Z23 Encounter for immunization: Secondary | ICD-10-CM | POA: Diagnosis not present

## 2017-02-21 DIAGNOSIS — F411 Generalized anxiety disorder: Secondary | ICD-10-CM | POA: Diagnosis not present

## 2017-02-21 MED ORDER — LORAZEPAM 0.5 MG PO TABS: 0.5000 mg | ORAL_TABLET | Freq: Two times a day (BID) | ORAL | 2 refills | Status: DC | PRN

## 2017-02-21 MED ORDER — SIMVASTATIN 20 MG PO TABS: 20.0000 mg | ORAL_TABLET | Freq: Every day | ORAL | 1 refills | Status: DC

## 2017-02-21 MED ORDER — LOSARTAN POTASSIUM 100 MG PO TABS: 100.0000 mg | ORAL_TABLET | Freq: Every day | ORAL | 1 refills | Status: DC

## 2017-02-21 MED ORDER — NEBIVOLOL HCL 10 MG PO TABS: 10.0000 mg | ORAL_TABLET | Freq: Every day | ORAL | 0 refills | Status: DC

## 2017-02-21 NOTE — Progress Notes (Signed)
Name: Tamara Sparks   MRN: [id number]    DOB: 1928-12-21   Date:02/21/2017       Progress Note  Subjective  Chief Complaint  Chief Complaint  Patient presents with  . Anxiety  . Hypertension    Anxiety  Presents for follow-up visit. Symptoms include depressed mood, excessive worry and nervous/anxious behavior. Patient reports no chest pain, insomnia, irritability, palpitations, panic, shortness of breath or suicidal ideas. The severity of symptoms is moderate and causing significant distress.    Hypertension  This is a chronic problem. The problem is unchanged. The problem is controlled. Associated symptoms include anxiety. Pertinent negatives include no blurred vision, chest pain, headaches, palpitations or shortness of breath. Past treatments include angiotensin blockers. There is no history of kidney disease, CAD/MI or CVA.  Hyperlipidemia  This is a chronic problem. The problem is controlled. Recent lipid tests were reviewed and are normal. Pertinent negatives include no chest pain, leg pain, myalgias or shortness of breath. Current antihyperlipidemic treatment includes statins.      Past Medical History:  Diagnosis Date  . Anxiety state, unspecified   . Cardiac dysrhythmia, unspecified   . Depression   . Dyslipidemia, goal LDL below 130   . Dyspnea    DOE, patient does alot of work for living alone  . Dysrhythmia    patient unsure of this  . Heart murmur   . History of stroke    Right cerebellar intracerebral hemorrhage  . HOH (hard of hearing)   . Hyposmolality and/or hyponatremia   . Stroke [facility]) 2012   right hand shakes and can't write well  . Tremors of nervous system    RIGHT ARM  . Tuberculosis 1947   RIGHT UPPER LOBE EXCISION AGE 72  . Unspecified essential hypertension   . Unspecified vitamin D deficiency   . Urinary tract infection, site not specified     Past Surgical History:  Procedure Laterality Date  . CARDIAC CATHETERIZATION     [facility]  .  CATARACT EXTRACTION W/PHACO Right 03/08/2016   Procedure: CATARACT EXTRACTION PHACO AND INTRAOCULAR LENS PLACEMENT (IOC);  Surgeon: Estill Cotta, MD;  Location: [facility] ORS;  Service: Ophthalmology;  Laterality: Right;   FLUID CASSETTE [id number] H, EXP 07/19/17 [place]    3:16.8   AP%   27.2 CDE 94.14  . EXTERNAL EAR SURGERY Left   . LOBECTOMY Right 1952   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53    Family History  Problem Relation Age of Onset  . Stroke Mother   . Hypertension Father   . Stroke Father     Social History   Social History  . Marital status: Widowed    Spouse name: N/A  . Number of children: N/A  . Years of education: N/A   Occupational History  . Not on file.   Social History Main Topics  . Smoking status: Never Smoker  . Smokeless tobacco: Never Used  . Alcohol use No  . Drug use: No  . Sexual activity: Not Currently   Other Topics Concern  . Not on file   Social History Narrative   Widowed. Accompanied by daughter today.   No illicit drug use. Never smoked. Does not drink alcohol.     Current Outpatient Prescriptions:  .  aspirin 81 MG tablet, Take 81 mg by mouth daily., Disp: , Rfl:  .  BYSTOLIC 10 MG tablet, take 1 tablet by mouth once daily, Disp: 90 tablet, Rfl: 1 .  Cholecalciferol (VITAMIN D3) 5000  UNITS CAPS, Take by mouth daily., Disp: , Rfl:  .  fluticasone (FLONASE) 50 MCG/ACT nasal spray, Place 2 sprays into both nostrils daily., Disp: 16 g, Rfl: 0 .  LORazepam (ATIVAN) 0.5 MG tablet, Take 1 tablet (0.5 mg total) by mouth 2 (two) times daily as needed for anxiety., Disp: 60 tablet, Rfl: 2 .  losartan (COZAAR) 100 MG tablet, Take 1 tablet (100 mg total) by mouth daily., Disp: 90 tablet, Rfl: 1 .  simvastatin (ZOCOR) 20 MG tablet, Take 1 tablet (20 mg total) by mouth daily at 6 PM., Disp: 90 tablet, Rfl: 1  Allergies  Allergen Reactions  . Penicillins Rash  . Sulfa Antibiotics Other (See Comments)    Pt does not remember but thinks that it just  didn't agree with her     Review of Systems  Constitutional: Negative for irritability.  Eyes: Negative for blurred vision.  Respiratory: Negative for shortness of breath.   Cardiovascular: Negative for chest pain and palpitations.  Musculoskeletal: Negative for myalgias.  Neurological: Negative for headaches.  Psychiatric/Behavioral: Negative for suicidal ideas. The patient is nervous/anxious. The patient does not have insomnia.       Objective  Vitals:   02/21/17 1147  BP: 140/80  Pulse: 62  SpO2: 96%  Weight: 107 lb 6.4 oz (48.7 kg)  Height: _0  (1.549 m)    Physical Exam  Constitutional: She is oriented to person, place, and time and well-developed, well-nourished, and in no distress.  HENT:  Head: Normocephalic and atraumatic.  Cardiovascular: Normal rate, regular rhythm and normal heart sounds.   No murmur heard. Pulmonary/Chest: Effort normal and breath sounds normal. She has no wheezes.  Abdominal: Soft. Bowel sounds are normal. There is no tenderness.  Musculoskeletal: She exhibits no edema.  Neurological: She is alert and oriented to person, place, and time.  Psychiatric: Mood, memory, affect and judgment normal.  Nursing note and vitals reviewed.      Recent Results (from the past 2160 hour(s))  Troponin I     Status: None   Collection Time: 12/18/16  1:38 PM  Result Value Ref Range   Troponin I <0.03 <0.03 ng/mL  CBC with Differential/Platelet     Status: None   Collection Time: 12/18/16  1:38 PM  Result Value Ref Range   WBC 7.5 3.6 - 11.0 K/uL   RBC 4.49 3.80 - 5.20 MIL/uL   Hemoglobin 13.6 12.0 - 16.0 g/dL   HCT 39.3 35.0 - 47.0 %   MCV 87.3 80.0 - 100.0 fL   MCH 30.3 26.0 - 34.0 pg   MCHC 34.7 32.0 - 36.0 g/dL   RDW 13.6 11.5 - 14.5 %   Platelets 310 150 - 440 K/uL   Neutrophils Relative % 78 %   Neutro Abs 5.9 1.4 - 6.5 K/uL   Lymphocytes Relative 14 %   Lymphs Abs 1.0 1.0 - 3.6 K/uL   Monocytes Relative 5 %   Monocytes Absolute 0.4  0.2 - 0.9 K/uL   Eosinophils Relative 2 %   Eosinophils Absolute 0.2 0 - 0.7 K/uL   Basophils Relative 1 %   Basophils Absolute 0.1 0 - 0.1 K/uL  Basic metabolic panel     Status: Abnormal   Collection Time: 12/18/16  1:38 PM  Result Value Ref Range   Sodium 140 135 - 145 mmol/L   Potassium 4.8 3.5 - 5.1 mmol/L   Chloride 106 101 - 111 mmol/L   CO2 26 22 - 32 mmol/L  Glucose, Bld 115 (H) 65 - 99 mg/dL   BUN 10 6 - 20 mg/dL   Creatinine, Ser 0.88 0.44 - 1.00 mg/dL   Calcium 9.9 8.9 - 10.3 mg/dL   GFR calc non Af Amer 58 (L) >60 mL/min   GFR calc Af Amer >60 >60 mL/min    Comment: (NOTE) The eGFR has been calculated using the CKD EPI equation. This calculation has not been validated in all clinical situations. eGFR's persistently <60 mL/min signify possible Chronic Kidney Disease.    Anion gap 8 5 - 15  Troponin I     Status: None   Collection Time: 12/18/16  4:59 PM  Result Value Ref Range   Troponin I <0.03 <0.03 ng/mL     Assessment & Plan  1. Flu vaccine need  - Flu vaccine HIGH DOSE PF (Fluzone High dose)  2. Dyslipidemia Continue on statin - simvastatin (ZOCOR) 20 MG tablet; Take 1 tablet (20 mg total) by mouth daily at 6 PM.  Dispense: 90 tablet; Refill: 1  3. Essential hypertension BP stable on present antihypertensive treatment - losartan (COZAAR) 100 MG tablet; Take 1 tablet (100 mg total) by mouth daily.  Dispense: 90 tablet; Refill: 1 - nebivolol (BYSTOLIC) 10 MG tablet; Take 1 tablet (10 mg total) by mouth daily.  Dispense: 90 tablet; Refill: 0  4. Generalized anxiety disorder Stable when taking lorazepam twice a day when necessary - LORazepam (ATIVAN) 0.5 MG tablet; Take 1 tablet (0.5 mg total) by mouth 2 (two) times daily as needed for anxiety.  Dispense: 60 tablet; Refill: 2   Najeeb Uptain [name] 02/21/2017 11:57 AM

## 2017-03-26 ENCOUNTER — Ambulatory Visit: Payer: Medicare Other

## 2017-03-27 ENCOUNTER — Ambulatory Visit: Payer: Medicare Other

## 2017-06-12 ENCOUNTER — Other Ambulatory Visit: Payer: Self-pay | Admitting: Family Medicine

## 2017-06-12 DIAGNOSIS — I10 Essential (primary) hypertension: Secondary | ICD-10-CM

## 2017-06-18 ENCOUNTER — Other Ambulatory Visit: Payer: Self-pay | Admitting: Family Medicine

## 2017-06-18 DIAGNOSIS — I10 Essential (primary) hypertension: Secondary | ICD-10-CM

## 2017-06-18 NOTE — Telephone Encounter (Signed)
Copied from CRM [phone]. Topic: Quick Communication - Rx Refill/Question >> Jun 18, 2017  9:06 AM Oneal GroutSebastian, Jennifer S wrote: Medication:  nebivolol (BYSTOLIC) 10 MG tablet    Has the patient contacted their pharmacy? Yes.     (Agent: If no, request that the patient contact the pharmacy for the refill.)   Preferred Pharmacy (with phone number or street name): [facility] [place]   Agent: Please be advised that RX refills may take up to 3 business days. We ask that you follow-up with your pharmacy.

## 2017-06-19 ENCOUNTER — Ambulatory Visit (INDEPENDENT_AMBULATORY_CARE_PROVIDER_SITE_OTHER): Payer: Medicare Other | Admitting: Family Medicine

## 2017-06-19 ENCOUNTER — Encounter: Payer: Self-pay | Admitting: Family Medicine

## 2017-06-19 DIAGNOSIS — E785 Hyperlipidemia, unspecified: Secondary | ICD-10-CM

## 2017-06-19 DIAGNOSIS — I10 Essential (primary) hypertension: Secondary | ICD-10-CM

## 2017-06-19 DIAGNOSIS — F411 Generalized anxiety disorder: Secondary | ICD-10-CM

## 2017-06-19 MED ORDER — SIMVASTATIN 20 MG PO TABS: 20.0000 mg | ORAL_TABLET | Freq: Every day | ORAL | 1 refills | Status: DC

## 2017-06-19 MED ORDER — TELMISARTAN 80 MG PO TABS: 80.0000 mg | ORAL_TABLET | Freq: Every day | ORAL | 0 refills | Status: DC

## 2017-06-19 MED ORDER — NEBIVOLOL HCL 10 MG PO TABS: 10.0000 mg | ORAL_TABLET | Freq: Every day | ORAL | 0 refills | Status: DC

## 2017-06-19 MED ORDER — SERTRALINE HCL 25 MG PO TABS: 25.0000 mg | ORAL_TABLET | Freq: Every day | ORAL | 0 refills | Status: DC

## 2017-06-19 MED ORDER — LORAZEPAM 0.5 MG PO TABS: 0.5000 mg | ORAL_TABLET | Freq: Two times a day (BID) | ORAL | 2 refills | Status: DC | PRN

## 2017-06-19 NOTE — Progress Notes (Signed)
Name: Tamara Sparks   MRN: [id number]    DOB: 1928/07/11   Date:06/19/2017       Progress Note  Subjective  Chief Complaint  Chief Complaint  Patient presents with  . Hypertension  . Medication Refill    Anxiety  Presents for follow-up visit. Symptoms include depressed mood, excessive worry and nervous/anxious behavior. Patient reports no chest pain, insomnia, irritability, palpitations, panic, shortness of breath or suicidal ideas. Primary symptoms comment: gets worried about world events such as cops getting shot, children getting killed when she hears about in the news etc. Also misses her loved ones who have passed away.. The severity of symptoms is moderate and causing significant distress. The quality of sleep is fair.    Hypertension  This is a chronic problem. The problem is unchanged. The problem is controlled. Associated symptoms include anxiety. Pertinent negatives include no blurred vision, chest pain, headaches, palpitations or shortness of breath. Past treatments include angiotensin blockers and beta blockers. There is no history of kidney disease, CAD/MI or CVA.  Hyperlipidemia  This is a chronic problem. The problem is controlled. Recent lipid tests were reviewed and are normal. Pertinent negatives include no chest pain, leg pain, myalgias or shortness of breath. Current antihyperlipidemic treatment includes statins.      Past Medical History:  Diagnosis Date  . Anxiety state, unspecified   . Cardiac dysrhythmia, unspecified   . Depression   . Dyslipidemia, goal LDL below 130   . Dyspnea    DOE, patient does alot of work for living alone  . Dysrhythmia    patient unsure of this  . Heart murmur   . History of stroke    Right cerebellar intracerebral hemorrhage  . HOH (hard of hearing)   . Hyposmolality and/or hyponatremia   . Stroke [facility]) 2012   right hand shakes and can't write well  . Tremors of nervous system    RIGHT ARM  . Tuberculosis 1947   RIGHT  UPPER LOBE EXCISION AGE 63  . Unspecified essential hypertension   . Unspecified vitamin D deficiency   . Urinary tract infection, site not specified     Past Surgical History:  Procedure Laterality Date  . CARDIAC CATHETERIZATION     [facility]  . CATARACT EXTRACTION W/PHACO Right 03/08/2016   Procedure: CATARACT EXTRACTION PHACO AND INTRAOCULAR LENS PLACEMENT (IOC);  Surgeon: Sallee Lange, MD;  Location: [facility] ORS;  Service: Ophthalmology;  Laterality: Right;   FLUID CASSETTE [id number] H, EXP 07/19/17 [place]    3:16.8   AP%   27.2 CDE 94.14  . EXTERNAL EAR SURGERY Left   . LOBECTOMY Right 1952   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17    Family History  Problem Relation Age of Onset  . Stroke Mother   . Hypertension Father   . Stroke Father   . Hypertension Sister   . Stroke Maternal Grandmother   . Hypertension Sister     Social History   Socioeconomic History  . Marital status: Widowed    Spouse name: Not on file  . Number of children: Not on file  . Years of education: Not on file  . Highest education level: Not on file  Social Needs  . Financial resource strain: Not on file  . Food insecurity - worry: Not on file  . Food insecurity - inability: Not on file  . Transportation needs - medical: Not on file  . Transportation needs - non-medical: Not on file  Occupational History  .  Not on file  Tobacco Use  . Smoking status: Never Smoker  . Smokeless tobacco: Never Used  Substance and Sexual Activity  . Alcohol use: No    Alcohol/week: 0.0 oz  . Drug use: No  . Sexual activity: Not Currently  Other Topics Concern  . Not on file  Social History Narrative   Widowed. Accompanied by daughter today.   No illicit drug use. Never smoked. Does not drink alcohol.     Current Outpatient Medications:  .  aspirin 81 MG tablet, Take 81 mg by mouth daily., Disp: , Rfl:  .  Cholecalciferol (VITAMIN D3) 5000 UNITS CAPS, Take by mouth daily., Disp: , Rfl:  .  LORazepam (ATIVAN) 0.5 MG  tablet, Take 1 tablet (0.5 mg total) by mouth 2 (two) times daily as needed for anxiety., Disp: 60 tablet, Rfl: 2 .  losartan (COZAAR) 100 MG tablet, Take 1 tablet (100 mg total) by mouth daily., Disp: 90 tablet, Rfl: 1 .  nebivolol (BYSTOLIC) 10 MG tablet, Take 1 tablet (10 mg total) by mouth daily., Disp: 90 tablet, Rfl: 0 .  simvastatin (ZOCOR) 20 MG tablet, Take 1 tablet (20 mg total) by mouth daily at 6 PM., Disp: 90 tablet, Rfl: 1 .  fluticasone (FLONASE) 50 MCG/ACT nasal spray, Place 2 sprays into both nostrils daily. (Patient not taking: Reported on 06/19/2017), Disp: 16 g, Rfl: 0  Allergies  Allergen Reactions  . Penicillins Rash  . Sulfa Antibiotics Other (See Comments)    Pt does not remember but thinks that it just didn't agree with her     Review of Systems  Constitutional: Negative for irritability.  Eyes: Negative for blurred vision.  Respiratory: Negative for shortness of breath.   Cardiovascular: Negative for chest pain and palpitations.  Musculoskeletal: Negative for myalgias.  Neurological: Negative for headaches.  Psychiatric/Behavioral: Negative for suicidal ideas. The patient is nervous/anxious. The patient does not have insomnia.      Objective  Vitals:   06/19/17 1459  BP: 132/74  Pulse: 67  Resp: 14  Temp: 97.9 F (36.6 C)  TempSrc: Oral  SpO2: 97%  Weight: 104 lb 6.4 oz (47.4 kg)    Physical Exam  Constitutional: She is oriented to person, place, and time and well-developed, well-nourished, and in no distress.  HENT:  Head: Normocephalic and atraumatic.  Cardiovascular: Normal rate, regular rhythm and normal heart sounds.  No murmur heard. Pulmonary/Chest: Effort normal and breath sounds normal. She has no wheezes.  Neurological: She is alert and oriented to person, place, and time.  Psychiatric: Mood, memory, affect and judgment normal.  Nursing note and vitals reviewed.      Assessment & Plan  1. Dyslipidemia FLP at goal, continue  statin - simvastatin (ZOCOR) 20 MG tablet; Take 1 tablet (20 mg total) by mouth daily at 6 PM.  Dispense: 90 tablet; Refill: 1 - Lipid panel  2. Essential hypertension Changed Losartan to Micardis because of recall, BP at goal - telmisartan (MICARDIS) 80 MG tablet; Take 1 tablet (80 mg total) by mouth daily.  Dispense: 90 tablet; Refill: 0 - nebivolol (BYSTOLIC) 10 MG tablet; Take 1 tablet (10 mg total) by mouth daily.  Dispense: 90 tablet; Refill: 0  3. Generalized anxiety disorder Originally worse, will add sertraline 25 mg to be taken at night, continue on Lorazepam as needed - LORazepam (ATIVAN) 0.5 MG tablet; Take 1 tablet (0.5 mg total) by mouth 2 (two) times daily as needed for anxiety.  Dispense: 60 tablet; Refill:  2 - sertraline (ZOLOFT) 25 MG tablet; Take 1 tablet (25 mg total) by mouth daily.  Dispense: 90 tablet; Refill: 0   Antonia Culbertson [name] Faylene KurtzShah [facility] 06/19/2017 3:18 PM

## 2017-09-04 ENCOUNTER — Ambulatory Visit (INDEPENDENT_AMBULATORY_CARE_PROVIDER_SITE_OTHER): Payer: Medicare Other

## 2017-09-04 VITALS — BP 128/66 | HR 60 | Temp 97.4°F | Resp 12 | Ht 61.0 in | Wt 100.3 lb

## 2017-09-04 DIAGNOSIS — Z23 Encounter for immunization: Secondary | ICD-10-CM | POA: Diagnosis not present

## 2017-09-04 DIAGNOSIS — Z0000 Encounter for general adult medical examination without abnormal findings: Secondary | ICD-10-CM | POA: Diagnosis not present

## 2017-09-04 NOTE — Progress Notes (Signed)
Subjective:   Tamara Sparks is a 84 y.o. female who presents for Medicare Annual (Subsequent) preventive examination.  Review of Systems:  N/A Cardiac Risk Factors include: advanced age (>72men, >16 women);dyslipidemia;hypertension;sedentary lifestyle     Objective:     Vitals: BP 128/66 (BP Location: Left Arm, Patient Position: Sitting, Cuff Size: Normal)   Pulse 60   Temp (!) 97.4 F (36.3 C) (Oral)   Resp 12   Ht 5\' 1"  (1.549 m)   Wt 100 lb 4.8 oz (45.5 kg)   SpO2 97%   BMI 18.95 kg/m   Body mass index is 18.95 kg/m.  Advanced Directives 09/04/2017 12/18/2016 12/18/2016 10/20/2016 07/10/2016 03/13/2016 01/31/2016  Does Patient Have a Medical Advance Directive? No No No No No No No  Would patient like information on creating a medical advance directive? Yes (MAU/Ambulatory/Procedural Areas - Information given) No - Patient declined - - - No - patient declined information No - patient declined information    Tobacco Social History   Tobacco Use  Smoking Status Never Smoker  Smokeless Tobacco Never Used  Tobacco Comment   smoking cessation materials not required     Counseling given: No Comment: smoking cessation materials not required   Clinical Intake:  Pre-visit preparation completed: Yes  Pain : No/denies pain   BMI - recorded: 19.73 Nutritional Status: BMI of 19-24  Normal Nutritional Risks: None Diabetes: No  How often do you need to have someone help you when you read instructions, pamphlets, or other written materials from your doctor or pharmacy?: 1 - Never  Interpreter Needed?: No  Information entered by :: AEversole, LPN   Hospitalizations/ED visits and surgeries occurring within the previous 12 months:  Pt was admitted on 12/18/16 @ [facility] for HTN, treated by Dr. Roxan Hockey.  Past Medical History:  Diagnosis Date  . Anxiety state, unspecified   . Cardiac dysrhythmia, unspecified   . Depression   . Dyslipidemia, goal LDL below 130   .  Dyspnea    DOE, patient does alot of work for living alone  . Dysrhythmia    patient unsure of this  . Heart murmur   . History of stroke    Right cerebellar intracerebral hemorrhage  . HOH (hard of hearing)   . Hyposmolality and/or hyponatremia   . Stroke [facility] 2012   right hand shakes and can't write well  . Tremors of nervous system    RIGHT ARM  . Tuberculosis 1947   RIGHT UPPER LOBE EXCISION AGE 84  . Unspecified essential hypertension   . Unspecified vitamin D deficiency   . Urinary tract infection, site not specified    Past Surgical History:  Procedure Laterality Date  . CARDIAC CATHETERIZATION     [facility]  . CATARACT EXTRACTION W/PHACO Right 03/08/2016   Procedure: CATARACT EXTRACTION PHACO AND INTRAOCULAR LENS PLACEMENT (IOC);  Surgeon: Sallee Lange, MD;  Location: [facility] ORS;  Service: Ophthalmology;  Laterality: Right;   FLUID CASSETTE [id number] H, EXP 07/19/17 [place]    3:16.8   AP%   27.2 CDE 94.14  . EXTERNAL EAR SURGERY Left   . LOBECTOMY Right 1952   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17   Family History  Problem Relation Age of Onset  . Stroke Mother   . Hypertension Father   . Stroke Father   . Hypertension Sister   . Hypertension Brother   . Cancer Brother        brain  . Hypertension Sister   . Hypertension  Brother   . Cancer Brother        stomach   Social History   Socioeconomic History  . Marital status: Widowed    Spouse name: [name]  . Number of children: 4  . Years of education: Not on file  . Highest education level: 9th grade  Occupational History  . Occupation: Retired  Engineer, production  . Financial resource strain: Not hard at all  . Food insecurity:    Worry: Never true    Inability: Never true  . Transportation needs:    Medical: No    Non-medical: No  Tobacco Use  . Smoking status: Never Smoker  . Smokeless tobacco: Never Used  . Tobacco comment: smoking cessation materials not required  Substance and Sexual Activity  . Alcohol use:  No    Alcohol/week: 0.0 oz  . Drug use: No  . Sexual activity: Not Currently  Lifestyle  . Physical activity:    Days per week: 0 days    Minutes per session: 0 min  . Stress: Rather much  Relationships  . Social connections:    Talks on phone: Patient refused    Gets together: Patient refused    Attends religious service: Patient refused    Active member of club or organization: Patient refused    Attends meetings of clubs or organizations: Patient refused    Relationship status: Widowed  Other Topics Concern  . Not on file  Social History Narrative  . Not on file    Outpatient Encounter Medications as of 09/04/2017  Medication Sig  . aspirin 81 MG tablet Take 81 mg by mouth daily.  . Cholecalciferol (VITAMIN D3) 5000 UNITS CAPS Take by mouth daily.  . fluticasone (FLONASE) 50 MCG/ACT nasal spray Place 2 sprays into both nostrils daily.  Marland Kitchen LORazepam (ATIVAN) 0.5 MG tablet Take 1 tablet (0.5 mg total) by mouth 2 (two) times daily as needed for anxiety.  . nebivolol (BYSTOLIC) 10 MG tablet Take 1 tablet (10 mg total) by mouth daily.  . sertraline (ZOLOFT) 25 MG tablet Take 1 tablet (25 mg total) by mouth daily.  . simvastatin (ZOCOR) 20 MG tablet Take 1 tablet (20 mg total) by mouth daily at 6 PM.  . telmisartan (MICARDIS) 80 MG tablet Take 1 tablet (80 mg total) by mouth daily. (Patient not taking: Reported on 09/04/2017)   No facility-administered encounter medications on file as of 09/04/2017.     Activities of Daily Living In your present state of health, do you have any difficulty performing the following activities: 09/04/2017 06/19/2017  Hearing? Y Y  Comment hearing loss L ear; denies hearing aids -  Vision? Y Y  Comment wears eyeglasses, cataract L eye -  Difficulty concentrating or making decisions? N N  Walking or climbing stairs? N Y  Dressing or bathing? N N  Doing errands, shopping? N N  Preparing Food and eating ? N -  Comment upper dentures -  Using the  Toilet? N -  In the past six months, have you accidently leaked urine? Y -  Comment urgency -  Do you have problems with loss of bowel control? N -  Managing your Medications? N -  Managing your Finances? N -  Housekeeping or managing your Housekeeping? N -  Some recent data might be hidden    Patient Care Team: Lada, Janit Bern, MD as PCP - General (Family Medicine)    Assessment:   This is a routine wellness examination for Sheva.  Exercise Activities and Dietary recommendations Current Exercise Habits: The patient does not participate in regular exercise at present, Exercise limited by: None identified  Goals    . DIET - INCREASE WATER INTAKE     Recommend to drink at least 6-8 8oz glasses of water per day.       Fall Risk Fall Risk  09/04/2017 06/19/2017 02/21/2017 12/18/2016 10/20/2016  Falls in the past year? Yes No No No No  Comment tripped over chair - - - -  Number falls in past yr: 2 or more - - - -  Injury with Fall? No - - - -  Risk Factor Category  High Fall Risk - - - -  Risk for fall due to : Medication side effect;Impaired vision;History of fall(s);Other (Comment) - - - -  Risk for fall due to: Comment Ativan and wears eyeglasses. Heraing loss L ear - - - -  Follow up Falls evaluation completed;Education provided;Falls prevention discussed - - - -   Is the home free of loose throw rugs in walkways, pet beds, electrical cords, etc? Yes Adequate lighting to reduce risk of falls?  Yes In addition, does the patient have any of the following: Stairs in or around the home WITH handrails? Yes Grab bars in the bathroom? Yes  Shower chair or a place to sit while bathing? Yes Use of an elevated toilet seat or a handicapped toilet? Yes Use of a cane, walker or w/c? No  Timed Get Up and Go Performed: Yes. Pt ambulated 10 feet within 22 sec. Gait slow, steady and without the use of an assistive device. No intervention required at this time. Fall risk prevention has been  discussed.  Community Resource Referral not required at this time.  Depression Screen PHQ 2/9 Scores 09/04/2017 06/19/2017 02/21/2017 12/18/2016  PHQ - 2 Score 2 0 3 0  PHQ- 9 Score 4 - 3 -     Cognitive Function     6CIT Screen 09/04/2017  What Year? 0 points  What month? 0 points  What time? 0 points  Count back from 20 0 points  Months in reverse 0 points  Repeat phrase 0 points  Total Score 0    Immunization History  Administered Date(s) Administered  . Influenza, High Dose Seasonal PF 03/16/2015, 03/13/2016, 02/21/2017  . Pneumococcal Conjugate-13 09/04/2017  . Pneumococcal Polysaccharide-23 05/22/2005    Qualifies for Shingles Vaccine? Yes. Due for Zostavax or Shingrix vaccine. Education has been provided regarding the importance of this vaccine. Pt has been advised to call her insurance company to determine her out of pocket expense. Advised she may also receive this vaccine at her local pharmacy or Health Dept. Verbalized acceptance and understanding.  Due for Tdap vaccine. Education has been provided regarding the importance of this vaccine. Pt has been advised she may receive this vaccine at her local pharmacy or Health Dept. Also advised to provide a copy of her vaccination record if she chooses to receive this vaccine at her local pharmacy. Verbalized acceptance and understanding.  Screening Tests Health Maintenance  Topic Date Due  . TETANUS/TDAP  06/19/2018 (Originally 10/02/1947)  . INFLUENZA VACCINE  12/20/2017  . PNA vac Low Risk Adult  Completed  . DEXA SCAN  Discontinued    Cancer Screenings: Lung: Low Dose CT Chest recommended if Age 70-80 years, 30 pack-year currently smoking OR have quit w/in 15years. Patient does not qualify. Breast Screening: No longer required Bone Density/Dexa: No longer required Colorectal: No longer required  Additional Screenings: Hepatitis C Screening: Does not qualify    Plan:  I have personally reviewed and addressed the  Medicare Annual Wellness questionnaire and have noted the following in the patient's chart:  A. Medical and social history B. Use of alcohol, tobacco or illicit drugs  C. Current medications and supplements D. Functional ability and status E.  Nutritional status F.  Physical activity G. Advance directives H. List of other physicians I.  Hospitalizations, surgeries, and ER visits in previous 12 months J.  Vitals K. Screenings such as hearing and vision if needed, cognitive and depression L. Referrals and appointments  In addition, I have reviewed and discussed with patient certain preventive protocols, quality metrics, and best practice recommendations. A written personalized care plan for preventive services as well as general preventive health recommendations were provided to patient.  See attached scanned questionnaire for additional information.   Signed,  Deon PillingAmmie Najma Bozarth, LPN Nurse Health Advisor

## 2017-09-04 NOTE — Patient Instructions (Signed)
Tamara Sparks , Thank you for taking time to come for your Medicare Wellness Visit. I appreciate your ongoing commitment to your health goals. Please review the following plan we discussed and let me know if I can assist you in the future.   Screening recommendations/referrals: Colorectal Screening: No longer required Mammogram: No longer required Bone Density: No longer required Lung Cancer Screening: You do not qualify for this screening Hepatitis C Screening: You do not qualify for this screening  Vision and Dental Exams: Recommended annual ophthalmology exams for early detection of glaucoma and other disorders of the eye Recommended annual dental exams for proper oral hygiene  Vaccinations: Influenza vaccine: Up to date Pneumococcal vaccine: Completed series today Tdap vaccine: Declined. Please call your insurance company to determine your out of pocket expense. You may also receive this vaccine at your local pharmacy or Health Dept. Shingles vaccine: Please call your insurance company to determine your out of pocket expense for the Shingrix vaccine. You may also receive this vaccine at your local pharmacy or Health Dept.  Advanced directives: Advance directive discussed with you today. I have provided a copy for you to complete at home and have notarized. Once this is complete please bring a copy in to our office so we can scan it into your chart.  Conditions/risks identified: Recommend to drink at least 6-8 8oz glasses of water per day.  Next appointment: Please schedule your Annual Wellness Visit with your Nurse Health Advisor in one year.  Preventive Care 28 Years and Older, Female Preventive care refers to lifestyle choices and visits with your health care provider that can promote health and wellness. What does preventive care include?  A yearly physical exam. This is also called an annual well check.  Dental exams once or twice a year.  Routine eye exams. Ask your health  care provider how often you should have your eyes checked.  Personal lifestyle choices, including:  Daily care of your teeth and gums.  Regular physical activity.  Eating a healthy diet.  Avoiding tobacco and drug use.  Limiting alcohol use.  Practicing safe sex.  Taking low-dose aspirin every day.  Taking vitamin and mineral supplements as recommended by your health care provider. What happens during an annual well check? The services and screenings done by your health care provider during your annual well check will depend on your age, overall health, lifestyle risk factors, and family history of disease. Counseling  Your health care provider may ask you questions about your:  Alcohol use.  Tobacco use.  Drug use.  Emotional well-being.  Home and relationship well-being.  Sexual activity.  Eating habits.  History of falls.  Memory and ability to understand (cognition).  Work and work Astronomer.  Reproductive health. Screening  You may have the following tests or measurements:  Height, weight, and BMI.  Blood pressure.  Lipid and cholesterol levels. These may be checked every 5 years, or more frequently if you are over 15 years old.  Skin check.  Lung cancer screening. You may have this screening every year starting at age 61 if you have a 30-pack-year history of smoking and currently smoke or have quit within the past 15 years.  Fecal occult blood test (FOBT) of the stool. You may have this test every year starting at age 61.  Flexible sigmoidoscopy or colonoscopy. You may have a sigmoidoscopy every 5 years or a colonoscopy every 10 years starting at age 63.  Hepatitis C blood test.  Hepatitis B  blood test.  Sexually transmitted disease (STD) testing.  Diabetes screening. This is done by checking your blood sugar (glucose) after you have not eaten for a while (fasting). You may have this done every 1-3 years.  Bone density scan. This is done  to screen for osteoporosis. You may have this done starting at age 88.  Mammogram. This may be done every 1-2 years. Talk to your health care provider about how often you should have regular mammograms. Talk with your health care provider about your test results, treatment options, and if necessary, the need for more tests. Vaccines  Your health care provider may recommend certain vaccines, such as:  Influenza vaccine. This is recommended every year.  Tetanus, diphtheria, and acellular pertussis (Tdap, Td) vaccine. You may need a Td booster every 10 years.  Zoster vaccine. You may need this after age 78.  Pneumococcal 13-valent conjugate (PCV13) vaccine. One dose is recommended after age 78.  Pneumococcal polysaccharide (PPSV23) vaccine. One dose is recommended after age 22. Talk to your health care provider about which screenings and vaccines you need and how often you need them. This information is not intended to replace advice given to you by your health care provider. Make sure you discuss any questions you have with your health care provider. Document Released: 06/04/2015 Document Revised: 01/26/2016 Document Reviewed: 03/09/2015 Elsevier Interactive Patient Education  2017 [place] Prevention in the Home Falls can cause injuries. They can happen to people of all ages. There are many things you can do to make your home safe and to help prevent falls. What can I do on the outside of my home?  Regularly fix the edges of walkways and driveways and fix any cracks.  Remove anything that might make you trip as you walk through a door, such as a raised step or threshold.  Trim any bushes or trees on the path to your home.  Use bright outdoor lighting.  Clear any walking paths of anything that might make someone trip, such as rocks or tools.  Regularly check to see if handrails are loose or broken. Make sure that both sides of any steps have handrails.  Any raised decks  and porches should have guardrails on the edges.  Have any leaves, snow, or ice cleared regularly.  Use sand or salt on walking paths during winter.  Clean up any spills in your garage right away. This includes oil or grease spills. What can I do in the bathroom?  Use night lights.  Install grab bars by the toilet and in the tub and shower. Do not use towel bars as grab bars.  Use non-skid mats or decals in the tub or shower.  If you need to sit down in the shower, use a plastic, non-slip stool.  Keep the floor dry. Clean up any water that spills on the floor as soon as it happens.  Remove soap buildup in the tub or shower regularly.  Attach bath mats securely with double-sided non-slip rug tape.  Do not have throw rugs and other things on the floor that can make you trip. What can I do in the bedroom?  Use night lights.  Make sure that you have a light by your bed that is easy to reach.  Do not use any sheets or blankets that are too big for your bed. They should not hang down onto the floor.  Have a firm chair that has side arms. You can use this for support  while you get dressed.  Do not have throw rugs and other things on the floor that can make you trip. What can I do in the kitchen?  Clean up any spills right away.  Avoid walking on wet floors.  Keep items that you use a lot in easy-to-reach places.  If you need to reach something above you, use a strong step stool that has a grab bar.  Keep electrical cords out of the way.  Do not use floor polish or wax that makes floors slippery. If you must use wax, use non-skid floor wax.  Do not have throw rugs and other things on the floor that can make you trip. What can I do with my stairs?  Do not leave any items on the stairs.  Make sure that there are handrails on both sides of the stairs and use them. Fix handrails that are broken or loose. Make sure that handrails are as long as the stairways.  Check any  carpeting to make sure that it is firmly attached to the stairs. Fix any carpet that is loose or worn.  Avoid having throw rugs at the top or bottom of the stairs. If you do have throw rugs, attach them to the floor with carpet tape.  Make sure that you have a light switch at the top of the stairs and the bottom of the stairs. If you do not have them, ask someone to add them for you. What else can I do to help prevent falls?  Wear shoes that:  Do not have high heels.  Have rubber bottoms.  Are comfortable and fit you well.  Are closed at the toe. Do not wear sandals.  If you use a stepladder:  Make sure that it is fully opened. Do not climb a closed stepladder.  Make sure that both sides of the stepladder are locked into place.  Ask someone to hold it for you, if possible.  Clearly mark and make sure that you can see:  Any grab bars or handrails.  First and last steps.  Where the edge of each step is.  Use tools that help you move around (mobility aids) if they are needed. These include:  Canes.  Walkers.  Scooters.  Crutches.  Turn on the lights when you go into a dark area. Replace any light bulbs as soon as they burn out.  Set up your furniture so you have a clear path. Avoid moving your furniture around.  If any of your floors are uneven, fix them.  If there are any pets around you, be aware of where they are.  Review your medicines with your doctor. Some medicines can make you feel dizzy. This can increase your chance of falling. Ask your doctor what other things that you can do to help prevent falls. This information is not intended to replace advice given to you by your health care provider. Make sure you discuss any questions you have with your health care provider. Document Released: 03/04/2009 Document Revised: 10/14/2015 Document Reviewed: 06/12/2014 Elsevier Interactive Patient Education  2017 [employer]

## 2017-09-20 ENCOUNTER — Ambulatory Visit
Admission: RE | Admit: 2017-09-20 | Discharge: 2017-09-20 | Disposition: A | Discharge status: Home / Self Care | Payer: Medicare Other | Source: Ambulatory Visit | Attending: Nurse Practitioner | Admitting: Nurse Practitioner

## 2017-09-20 ENCOUNTER — Ambulatory Visit (INDEPENDENT_AMBULATORY_CARE_PROVIDER_SITE_OTHER): Payer: Medicare Other | Admitting: Nurse Practitioner

## 2017-09-20 ENCOUNTER — Telehealth: Payer: Self-pay | Admitting: Family Medicine

## 2017-09-20 ENCOUNTER — Encounter: Payer: Self-pay | Admitting: Nurse Practitioner

## 2017-09-20 VITALS — BP 100/68 | HR 73 | Temp 98.0°F | Resp 16 | Ht 61.0 in | Wt 99.2 lb

## 2017-09-20 DIAGNOSIS — E785 Hyperlipidemia, unspecified: Secondary | ICD-10-CM | POA: Diagnosis not present

## 2017-09-20 DIAGNOSIS — L570 Actinic keratosis: Secondary | ICD-10-CM

## 2017-09-20 DIAGNOSIS — R110 Nausea: Secondary | ICD-10-CM | POA: Diagnosis not present

## 2017-09-20 DIAGNOSIS — R197 Diarrhea, unspecified: Secondary | ICD-10-CM

## 2017-09-20 DIAGNOSIS — F411 Generalized anxiety disorder: Secondary | ICD-10-CM

## 2017-09-20 DIAGNOSIS — I10 Essential (primary) hypertension: Secondary | ICD-10-CM | POA: Diagnosis not present

## 2017-09-20 DIAGNOSIS — X32XXXA Exposure to sunlight, initial encounter: Secondary | ICD-10-CM

## 2017-09-20 LAB — CBC WITH DIFFERENTIAL/PLATELET
BASOS PCT: 0.7 %
Basophils Absolute: 59 cells/uL (ref 0–200)
EOS PCT: 7.6 %
Eosinophils Absolute: 638 cells/uL — ABNORMAL HIGH (ref 15–500)
HEMATOCRIT: 40.7 % (ref 35.0–45.0)
HEMOGLOBIN: 14 g/dL (ref 11.7–15.5)
LYMPHS ABS: 1226 {cells}/uL (ref 850–3900)
MCH: 29.8 pg (ref 27.0–33.0)
MCHC: 34.4 g/dL (ref 32.0–36.0)
MCV: 86.6 fL (ref 80.0–100.0)
MPV: 9.7 fL (ref 7.5–12.5)
Monocytes Relative: 6.9 %
NEUTROS ABS: 5897 {cells}/uL (ref 1500–7800)
Neutrophils Relative %: 70.2 %
Platelets: 359 10*3/uL (ref 140–400)
RBC: 4.7 10*6/uL (ref 3.80–5.10)
RDW: 12.5 % (ref 11.0–15.0)
Total Lymphocyte: 14.6 %
WBC: 8.4 10*3/uL (ref 3.8–10.8)
WBCMIX: 580 {cells}/uL (ref 200–950)

## 2017-09-20 LAB — LIPID PANEL
CHOLESTEROL: 191 mg/dL (ref <200)
HDL: 55 mg/dL (ref >50)
LDL CHOLESTEROL (CALC): 115 mg/dL — AB
Non-HDL Cholesterol (Calc): 136 mg/dL (calc) — ABNORMAL HIGH (ref <130)
TRIGLYCERIDES: 105 mg/dL (ref <150)
Total CHOL/HDL Ratio: 3.5 (calc) (ref <5.0)

## 2017-09-20 LAB — HEMOCCULT GUIAC POC 1CARD (OFFICE): FECAL OCCULT BLD: NEGATIVE

## 2017-09-20 LAB — COMPLETE METABOLIC PANEL WITH GFR
AG Ratio: 1.7 (calc) (ref 1.0–2.5)
ALBUMIN MSPROF: 4.3 g/dL (ref 3.6–5.1)
ALKALINE PHOSPHATASE (APISO): 84 U/L (ref 33–130)
ALT: 8 U/L (ref 6–29)
AST: 19 U/L (ref 10–35)
BILIRUBIN TOTAL: 0.6 mg/dL (ref 0.2–1.2)
BUN: 7 mg/dL (ref 7–25)
CHLORIDE: 101 mmol/L (ref 98–110)
CO2: 31 mmol/L (ref 20–32)
CREATININE: 0.87 mg/dL (ref 0.60–0.88)
Calcium: 10 mg/dL (ref 8.6–10.4)
GFR, Est African American: 70 mL/min/{1.73_m2} (ref >=60)
GFR, Est Non African American: 60 mL/min/{1.73_m2} (ref >=60)
GLUCOSE: 99 mg/dL (ref 65–139)
Globulin: 2.6 g/dL (calc) (ref 1.9–3.7)
Potassium: 3.6 mmol/L (ref 3.5–5.3)
Sodium: 137 mmol/L (ref 135–146)
Total Protein: 6.9 g/dL (ref 6.1–8.1)

## 2017-09-20 MED ORDER — SIMVASTATIN 20 MG PO TABS: 20.0000 mg | ORAL_TABLET | Freq: Every day | ORAL | 1 refills | Status: DC

## 2017-09-20 MED ORDER — PEDIALYTE ADVANCED CARE PO SOLN: 1.0000 | Freq: Two times a day (BID) | ORAL | 3 refills | Status: AC

## 2017-09-20 MED ORDER — SERTRALINE HCL 25 MG PO TABS: 25.0000 mg | ORAL_TABLET | Freq: Every day | ORAL | 0 refills | Status: DC

## 2017-09-20 MED ORDER — NEBIVOLOL HCL 10 MG PO TABS
10.0000 mg | ORAL_TABLET | Freq: Every day | ORAL | 0 refills | Status: DC
Start: 2017-09-20 — End: 2017-12-18

## 2017-09-20 NOTE — Progress Notes (Signed)
Name: Tamara Sparks   MRN: [id number]    DOB: 11/11/1928   Date:09/20/2017       Progress Note  Subjective  Chief Complaint  Chief Complaint  Patient presents with  . Diarrhea    for 1 week  . Medication Refill    HPI  Patient presents with diarrhea ongoing for 14 days. Frequency- 5-6 episodes a day, Bristol scale 7. States started eating activa prior to episodes but hasn't eaten in the last 5 days. Prior to this she was constipated so she was on stool softner took it for a few days prior had a good bowel movement. Since diarrhea has stopped drinking milk, has been following a brat diet.  Associated symptoms- nausea (has nausea).  Denies abdominal pain, nausea, vomiting, dark yellow or scant urine, decreased skin turgor, orthostatic symptoms, fevers, pain with BM no blood.  Pt denies known potential exposures, such as food history, residence, occupational exposure, recent and remote travel, pets. Denies recent antibiotic use.    Patient Active Problem List   Diagnosis Date Noted  . Dyslipidemia 09/23/2015  . Hyperglycemia 09/23/2015  . Seasonal allergies 09/09/2015  . Dyslipidemia, goal LDL below 130 03/16/2015  . Anxiety disorder 12/08/2014  . Rapid heart beat 01/23/2014  . Diastolic murmur 01/23/2014  . Essential hypertension 01/23/2014    Past Medical History:  Diagnosis Date  . Anxiety state, unspecified   . Cardiac dysrhythmia, unspecified   . Depression   . Dyslipidemia, goal LDL below 130   . Dyspnea    DOE, patient does alot of work for living alone  . Dysrhythmia    patient unsure of this  . Heart murmur   . History of stroke    Right cerebellar intracerebral hemorrhage  . HOH (hard of hearing)   . Hyposmolality and/or hyponatremia   . Stroke [facility]) 2012   right hand shakes and can't write well  . Tremors of nervous system    RIGHT ARM  . Tuberculosis 1947   RIGHT UPPER LOBE EXCISION AGE 16  . Unspecified essential hypertension   . Unspecified vitamin  D deficiency   . Urinary tract infection, site not specified     Past Surgical History:  Procedure Laterality Date  . CARDIAC CATHETERIZATION     [facility]  . CATARACT EXTRACTION W/PHACO Right 03/08/2016   Procedure: CATARACT EXTRACTION PHACO AND INTRAOCULAR LENS PLACEMENT (IOC);  Surgeon: Sallee Lange, MD;  Location: [facility] ORS;  Service: Ophthalmology;  Laterality: Right;   FLUID CASSETTE [id number] H, EXP 07/19/17 [place]    3:16.8   AP%   27.2 CDE 94.14  . EXTERNAL EAR SURGERY Left   . LOBECTOMY Right 1952   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17    Social History   Tobacco Use  . Smoking status: Never Smoker  . Smokeless tobacco: Never Used  . Tobacco comment: smoking cessation materials not required  Substance Use Topics  . Alcohol use: No    Alcohol/week: 0.0 oz     Current Outpatient Medications:  .  aspirin 81 MG tablet, Take 81 mg by mouth daily., Disp: , Rfl:  .  Cholecalciferol (VITAMIN D3) 5000 UNITS CAPS, Take by mouth daily., Disp: , Rfl:  .  fluticasone (FLONASE) 50 MCG/ACT nasal spray, Place 2 sprays into both nostrils daily., Disp: 16 g, Rfl: 0 .  LORazepam (ATIVAN) 0.5 MG tablet, Take 1 tablet (0.5 mg total) by mouth 2 (two) times daily as needed for anxiety., Disp: 60 tablet, Rfl: 2 .  nebivolol (BYSTOLIC) 10 MG tablet, Take 1 tablet (10 mg total) by mouth daily., Disp: 90 tablet, Rfl: 0 .  sertraline (ZOLOFT) 25 MG tablet, Take 1 tablet (25 mg total) by mouth daily., Disp: 90 tablet, Rfl: 0 .  simvastatin (ZOCOR) 20 MG tablet, Take 1 tablet (20 mg total) by mouth daily at 6 PM., Disp: 90 tablet, Rfl: 1 .  telmisartan (MICARDIS) 80 MG tablet, Take 1 tablet (80 mg total) by mouth daily., Disp: 90 tablet, Rfl: 0  Allergies  Allergen Reactions  . Penicillins Rash  . Sulfa Antibiotics Other (See Comments)    Pt does not remember but thinks that it just didn't agree with her    ROS  No other specific complaints in a complete review of systems (except as listed in HPI  above).  Objective  Vitals:   09/20/17 0936  BP: 100/68  Pulse: 73  Resp: 16  Temp: 98 F (36.7 C)  TempSrc: Oral  SpO2: 95%  Weight: 99 lb 3.2 oz (45 kg)  Height:  (1.549 m)     Body mass index is 18.74 kg/m.  Nursing Note and Vital Signs reviewed.  Physical Exam   Constitutional: Patient appears well-developed and well-nourished.  No distress.  Cardiovascular: Normal rate, regular rhythm, S1/S2 present.  No murmur or rub heard.  Pulmonary/Chest: Effort normal and breath sounds clear. No respiratory distress or retractions. Abdominal: Soft and non-tender, bowel sounds hyperactive throughout, multiple superficial skin lesions on abdomen no masses noted. Rectal: rectal tone present throughout, non-bleeding external hemorrhoid, stool burden palpated but unable to remove.   Psychiatric: Patient has a normal mood and affect. behavior is normal. Judgment and thought content normal.  No results found for this or any previous visit (from the past 72 hour(s)).  Assessment & Plan  1. Essential hypertension - nebivolol (BYSTOLIC) 10 MG tablet; Take 1 tablet (10 mg total) by mouth daily. For blood pressure  Dispense: 90 tablet; Refill: 0  2. Generalized anxiety disorder - takes ativan has two more refills on bottle discussed importance  Of getting in with psych prior to finishing original bottle and dangers of stopping benzo abruptly.  - sertraline (ZOLOFT) 25 MG tablet; Take 1 tablet (25 mg total) by mouth daily. For mood  Dispense: 90 tablet; Refill: 0 - Refer to psychiatry    3. Dyslipidemia Had toast this morning.  - simvastatin (ZOCOR) 20 MG tablet; Take 1 tablet (20 mg total) by mouth daily at 6 PM. For cholesterol  Dispense: 90 tablet; Refill: 1 - Lipid Profile  4. Diarrhea of presumed infectious origin - Stool Culture - COMPLETE METABOLIC PANEL WITH GFR - CBC w/Diff/Platelet  5. Actinic Keratosis Pt has multiple lesions throughout skin with two visibile AK's  pt sts doesn't want to do anything about it and has multiple ones. Discussed simple procedure and follow-up with derm. Will re-discuss at followup visit.    -Red flags and when to present for emergency care or RTC including fever >101.70F, chest pain, shortness of breath, new/worsening/un-resolving symptoms, orthostatic changes, dark urine, lethargy, cramping  reviewed with patient at time of visit. Follow up and care instructions discussed and provided in AVS.

## 2017-09-20 NOTE — Telephone Encounter (Signed)
Tamara Sparks, from [facility] imaging calling with xray report of Abd 1 view. Results in Epic. Impression: No acute intra-abdominal abnormality identified.

## 2017-09-20 NOTE — Patient Instructions (Addendum)
It was great meeting you today! Here is some of what we discussed as a reminder:  - It is important to drink plenty of water and include electrolyte replacement- try drinking 2 Pedialyte a day. We want your urine to be clear yellow.  - If you notice dark urine, only peeing small amounts, muscle cramping, heart racing, feeling extremely tired please go to the hospital you may require intravenous fluids - Continue bland diet - We are doing an xray, lab work and a stool culture to figure out what is causing your diarrhea. And will be sending you to a GI doctor.  -Since you have not started taking the talmasartan do not start yet, we will follow-up with you in a week and see if we need to restart it when you are feeling better.  - We do not refill lorazepam at this clinic anymore so we sent a refferal to psychiatry. It is vital to not every stop taking this medication suddenly. So please get in before you pick up your next refill. - I do recommend you see a dermatologist as you  Have some actinic keratosis- despite not wanting to see more doctors because it will likely be easy for them to remove these lesions. We can discuss this further in your one week follow-up    Food Choices to Help Relieve Diarrhea, Adult When you have diarrhea, the foods you eat and your eating habits are very important. Choosing the right foods and drinks can help:  Relieve diarrhea.  Replace lost fluids and nutrients.  Prevent dehydration.  What general guidelines should I follow? Relieving diarrhea  Choose foods with less than 2 g or .07 oz. of fiber per serving.  Limit fats to less than 8 tsp (38 g or 1.34 oz.) a day.  Avoid the following: ? Foods and beverages sweetened with high-fructose corn syrup, honey, or sugar alcohols such as xylitol, sorbitol, and mannitol. ? Foods that contain a lot of fat or sugar. ? Fried, greasy, or spicy foods. ? High-fiber grains, breads, and cereals. ? Raw fruits and  vegetables.  Eat foods that are rich in probiotics. These foods include dairy products such as yogurt and fermented milk products. They help increase healthy bacteria in the stomach and intestines (gastrointestinal tract, or GI tract).  If you have lactose intolerance, avoid dairy products. These may make your diarrhea worse.  Take medicine to help stop diarrhea (antidiarrheal medicine) only as told by your health care provider. Replacing nutrients  Eat small meals or snacks every 3-4 hours.  Eat bland foods, such as white rice, toast, or baked potato, until your diarrhea starts to get better. Gradually reintroduce nutrient-rich foods as tolerated or as told by your health care provider. This includes: ? Well-cooked protein foods. ? Peeled, seeded, and soft-cooked fruits and vegetables. ? Low-fat dairy products.  Take vitamin and mineral supplements as told by your health care provider. Preventing dehydration   Start by sipping water or a special solution to prevent dehydration (oral rehydration solution, ORS). Urine that is clear or pale yellow means that you are getting enough fluid.  Try to drink at least 8-10 cups of fluid each day to help replace lost fluids.  You may add other liquids in addition to water, such as clear juice or decaffeinated sports drinks, as tolerated or as told by your health care provider.  Avoid drinks with caffeine, such as coffee, tea, or soft drinks.  Avoid alcohol. What foods are recommended? The items listed  may not be a complete list. Talk with your health care provider about what dietary choices are best for you. Grains White rice. White, [place], or pita breads (fresh or toasted), including plain rolls, buns, or bagels. White pasta. Saltine, soda, or graham crackers. Pretzels. Low-fiber cereal. Cooked cereals made with water (such as cornmeal, farina, or cream cereals). Plain muffins. Matzo. Melba toast. Zwieback. Vegetables Potatoes (without the  skin). Most well-cooked and canned vegetables without skins or seeds. Tender lettuce. Fruits Apple sauce. Fruits canned in juice. Cooked apricots, cherries, grapefruit, peaches, pears, or plums. Fresh bananas and cantaloupe. Meats and other protein foods Baked or boiled chicken. Eggs. Tofu. Fish. Seafood. Smooth nut butters. Ground or well-cooked tender beef, ham, veal, lamb, pork, or poultry. Dairy Plain yogurt, kefir, and unsweetened liquid yogurt. Lactose-free milk, buttermilk, skim milk, or soy milk. Low-fat or nonfat hard cheese. Beverages Water. Low-calorie sports drinks. Fruit juices without pulp. Strained tomato and vegetable juices. Decaffeinated teas. Sugar-free beverages not sweetened with sugar alcohols. Oral rehydration solutions, if approved by your health care provider. Seasoning and other foods Bouillon, broth, or soups made from recommended foods. What foods are not recommended? The items listed may not be a complete list. Talk with your health care provider about what dietary choices are best for you. Grains Whole grain, whole wheat, bran, or rye breads, rolls, pastas, and crackers. Wild or brown rice. Whole grain or bran cereals. Barley. Oats and oatmeal. Corn tortillas or taco shells. Granola. Popcorn. Vegetables Raw vegetables. Fried vegetables. Cabbage, broccoli, Brussels sprouts, artichokes, baked beans, beet greens, corn, kale, legumes, peas, sweet potatoes, and yams. Potato skins. Cooked spinach and cabbage. Fruits Dried fruit, including raisins and dates. Raw fruits. Stewed or dried prunes. Canned fruits with syrup. Meat and other protein foods Fried or fatty meats. Deli meats. Chunky nut butters. Nuts and seeds. Beans and lentils. Tomasa Blase. Hot dogs. Sausage. Dairy High-fat cheeses. Whole milk, chocolate milk, and beverages made with milk, such as milk shakes. Half-and-half. Cream. sour cream. Ice cream. Beverages Caffeinated beverages (such as coffee, tea, soda, or  energy drinks). Alcoholic beverages. Fruit juices with pulp. Prune juice. Soft drinks sweetened with high-fructose corn syrup or sugar alcohols. High-calorie sports drinks. Fats and oils Butter. Cream sauces. Margarine. Salad oils. Plain salad dressings. Olives. Avocados. Mayonnaise. Sweets and desserts Sweet rolls, doughnuts, and sweet breads. Sugar-free desserts sweetened with sugar alcohols such as xylitol and sorbitol. Seasoning and other foods Honey. Hot sauce. Chili powder. Gravy. Cream-based or milk-based soups. Pancakes and waffles. Summary  When you have diarrhea, the foods you eat and your eating habits are very important.  Make sure you get at least 8-10 cups of fluid each day, or enough to keep your urine clear or pale yellow.  Eat bland foods and gradually reintroduce healthy, nutrient-rich foods as tolerated, or as told by your health care provider.  Avoid high-fiber, fried, greasy, or spicy foods. This information is not intended to replace advice given to you by your health care provider. Make sure you discuss any questions you have with your health care provider. Document Released: 07/29/2003 Document Revised: 05/05/2016 Document Reviewed: 05/05/2016 Elsevier Interactive Patient Education  [employer].

## 2017-09-21 DIAGNOSIS — E785 Hyperlipidemia, unspecified: Secondary | ICD-10-CM | POA: Diagnosis not present

## 2017-09-21 DIAGNOSIS — R197 Diarrhea, unspecified: Secondary | ICD-10-CM | POA: Diagnosis not present

## 2017-09-25 LAB — STOOL CULTURE
MICRO NUMBER:: 90542696
MICRO NUMBER:: 90542697
MICRO NUMBER:: 90542698
SHIGA RESULT:: NOT DETECTED
SPECIMEN QUALITY: ADEQUATE
SPECIMEN QUALITY: ADEQUATE
SPECIMEN QUALITY:: ADEQUATE

## 2017-09-26 ENCOUNTER — Telehealth: Payer: Self-pay | Admitting: Family Medicine

## 2017-09-26 NOTE — Telephone Encounter (Signed)
Our policy at this facility is to no longer prescribe controlled substances such as benzodiazepines like your ativan for chronic disease management. You were referred to psychiatry to manage the anxiety for which the lorazepam is used, however they informed us they do not prescribe this as well. If you can identify a doctor that will manage this specific medication I am more than happy to place a referral there if needed. If not, it is vital that you do not stop this medication abruptly as it can be life threatening. Listed below are various options that can assist you in safely coming off this medication.   Drug rehabilitation centers are listed below:  [facility]. [address],  [place], [place] [address] Tel.: [phone]  [facility] [address], [place], [place] [address] Tel.: [phone]      [facility]) [address] [place], [place], [place] [address] Tel.: [phone]  [facility] [address] [place], [place] [address] Tel.: [phone]      [facility] [address], [place], [place] [address] Tel.: [phone]  [facility] [address] [place], [place] [address] Tel.: [phone]

## 2017-09-26 NOTE — Telephone Encounter (Signed)
Lanora Manis,  Please review the notes from [facility] that is attached to the referral and decided on how to proceed.  Thanks

## 2017-09-26 NOTE — Telephone Encounter (Signed)
Copied from CRM [id number]. Topic: Quick Communication - See Telephone Encounter >> Sep 26, 2017  1:25 PM Cipriano Bunker wrote: CRM for notification. See Telephone encounter for: 09/26/17.  [name], Daughter called regarding the psychiatrist office called and left message on Tamara Sparks's phone that she would have to agree to go off LORazepam (ATIVAN) 0.5 MG tablet as they do not prescribe this medication if they are to see her.   She has been crying for 2 days. She is scared to take anything else and does not do change well.  Asking if there is another doctor that they can take her to that would prescribe this medication.  In [place] if at all possible  If referral is put in please have them call daughters as mom does not always understand

## 2017-09-27 NOTE — Telephone Encounter (Signed)
Daughter calling back [phone]

## 2017-09-27 NOTE — Telephone Encounter (Signed)
Left message for Hilary Hertz to call office back to discuss appointments for mother

## 2017-09-28 NOTE — Telephone Encounter (Signed)
Spoke to daughter, she stated she will make appointment with Psychiatry to have Lorazepam changed. Stated that they will not give her a script for that but will prescribe something else.

## 2017-10-02 NOTE — Telephone Encounter (Signed)
Daughter calling back to advise please do another referral to [facility] Please call the daughter with appt info  [phone]  Hilary Hertz

## 2017-10-02 NOTE — Telephone Encounter (Signed)
Per the daughter's request, referral has been re-instated.

## 2017-10-05 ENCOUNTER — Encounter: Payer: Self-pay | Admitting: Nurse Practitioner

## 2017-10-05 ENCOUNTER — Ambulatory Visit (INDEPENDENT_AMBULATORY_CARE_PROVIDER_SITE_OTHER): Payer: Medicare Other | Admitting: Nurse Practitioner

## 2017-10-05 VITALS — BP 130/68 | HR 64 | Temp 98.1°F | Resp 16 | Ht 61.0 in | Wt 96.2 lb

## 2017-10-05 DIAGNOSIS — Z8611 Personal history of tuberculosis: Secondary | ICD-10-CM | POA: Insufficient documentation

## 2017-10-05 DIAGNOSIS — R05 Cough: Secondary | ICD-10-CM

## 2017-10-05 DIAGNOSIS — R634 Abnormal weight loss: Secondary | ICD-10-CM | POA: Diagnosis not present

## 2017-10-05 DIAGNOSIS — R059 Cough, unspecified: Secondary | ICD-10-CM

## 2017-10-05 DIAGNOSIS — L570 Actinic keratosis: Secondary | ICD-10-CM

## 2017-10-05 DIAGNOSIS — J069 Acute upper respiratory infection, unspecified: Secondary | ICD-10-CM

## 2017-10-05 DIAGNOSIS — Z902 Acquired absence of lung [part of]: Secondary | ICD-10-CM | POA: Insufficient documentation

## 2017-10-05 HISTORY — DX: Personal history of tuberculosis: Z86.11

## 2017-10-05 HISTORY — DX: Acquired absence of lung (part of): Z90.2

## 2017-10-05 MED ORDER — GUAIFENESIN 200 MG PO TABS: 200.0000 mg | ORAL_TABLET | Freq: Two times a day (BID) | ORAL | 0 refills | Status: DC

## 2017-10-05 MED ORDER — BENZONATATE 100 MG PO CAPS: 100.0000 mg | ORAL_CAPSULE | Freq: Three times a day (TID) | ORAL | 0 refills | Status: DC

## 2017-10-05 MED ORDER — LORATADINE 5 MG PO CHEW: 5.0000 mg | CHEWABLE_TABLET | Freq: Every day | ORAL | 0 refills | Status: DC

## 2017-10-05 NOTE — Progress Notes (Addendum)
Name: Tamara Sparks   MRN: [id number]    DOB: 28-Dec-1928   Date:10/05/2017       Progress Note  Subjective  Chief Complaint  Chief Complaint  Patient presents with  . URI    hoarse, cough, congested, coughing up phelgm, feels like its in her chest for 6 days    HPI  Since Sunday has been having URI symptoms, sore throat, then hoarseness, and then cough- non-productive, no feeling chest congestions, nasal congestion, ears are popping, mild headache.  No shob, cp, dysphagia  Has tried aspirin- relief of headache.      Patient Active Problem List   Diagnosis Date Noted  . Dyslipidemia 09/23/2015  . Hyperglycemia 09/23/2015  . Seasonal allergies 09/09/2015  . Dyslipidemia, goal LDL below 130 03/16/2015  . Anxiety disorder 12/08/2014  . Rapid heart beat 01/23/2014  . Diastolic murmur 01/23/2014  . Essential hypertension 01/23/2014    Past Medical History:  Diagnosis Date  . Anxiety state, unspecified   . Cardiac dysrhythmia, unspecified   . Depression   . Dyslipidemia, goal LDL below 130   . Dyspnea    DOE, patient does alot of work for living alone  . Dysrhythmia    patient unsure of this  . Heart murmur   . History of stroke    Right cerebellar intracerebral hemorrhage  . HOH (hard of hearing)   . Hyposmolality and/or hyponatremia   . Stroke [facility]) 2012   right hand shakes and can't write well  . Tremors of nervous system    RIGHT ARM  . Tuberculosis 1947   RIGHT UPPER LOBE EXCISION AGE 87  . Unspecified essential hypertension   . Unspecified vitamin D deficiency   . Urinary tract infection, site not specified     Past Surgical History:  Procedure Laterality Date  . CARDIAC CATHETERIZATION     [facility]  . CATARACT EXTRACTION W/PHACO Right 03/08/2016   Procedure: CATARACT EXTRACTION PHACO AND INTRAOCULAR LENS PLACEMENT (IOC);  Surgeon: Sallee Lange, MD;  Location: [facility] ORS;  Service: Ophthalmology;  Laterality: Right;   FLUID CASSETTE [id number] H, EXP  07/19/17 [place]    3:16.8   AP%   27.2 CDE 94.14  . EXTERNAL EAR SURGERY Left   . LOBECTOMY Right 1952   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17    Social History   Tobacco Use  . Smoking status: Never Smoker  . Smokeless tobacco: Never Used  . Tobacco comment: smoking cessation materials not required  Substance Use Topics  . Alcohol use: No    Alcohol/week: 0.0 oz     Current Outpatient Medications:  .  aspirin 81 MG tablet, Take 81 mg by mouth daily., Disp: , Rfl:  .  Cholecalciferol (VITAMIN D3) 5000 UNITS CAPS, Take by mouth daily., Disp: , Rfl:  .  fluticasone (FLONASE) 50 MCG/ACT nasal spray, Place 2 sprays into both nostrils daily., Disp: 16 g, Rfl: 0 .  nebivolol (BYSTOLIC) 10 MG tablet, Take 1 tablet (10 mg total) by mouth daily. For blood pressure, Disp: 90 tablet, Rfl: 0 .  sertraline (ZOLOFT) 25 MG tablet, Take 1 tablet (25 mg total) by mouth daily. For mood, Disp: 90 tablet, Rfl: 0 .  simvastatin (ZOCOR) 20 MG tablet, Take 1 tablet (20 mg total) by mouth daily at 6 PM. For cholesterol, Disp: 90 tablet, Rfl: 1 .  telmisartan (MICARDIS) 80 MG tablet, Take 1 tablet (80 mg total) by mouth daily., Disp: 90 tablet, Rfl: 0 .  LORazepam (ATIVAN)  0.5 MG tablet, Take 1 tablet (0.5 mg total) by mouth 2 (two) times daily as needed for anxiety. (Patient not taking: Reported on 10/05/2017), Disp: 60 tablet, Rfl: 2  Allergies  Allergen Reactions  . Penicillins Rash  . Sulfa Antibiotics Other (See Comments)    Pt does not remember but thinks that it just didn't agree with her    ROS  No other specific complaints in a complete review of systems (except as listed in HPI above).  Objective  Vitals:   10/05/17 1102  BP: 130/68  Pulse: 64  Resp: 16  Temp: 98.1 F (36.7 C)  TempSrc: Oral  SpO2: 96%  Weight: 96 lb 3.2 oz (43.6 kg)  Height:  (1.549 m)     Body mass index is 18.18 kg/m.  Nursing Note and Vital Signs reviewed.  Physical Exam   Constitutional: Patient  appears well-developed and well-nourished.  No distress.  HEENT: head atraumatic, normocephalic, pupils equal and reactive to light, left Tm- scarring, no redness or bulging, right TM unable to visualize due to dried cerumen, no maxillary or frontal sinus tenderness , neck supple without lymphadenopathy, oropharynx pink and dry without exudate, no nasal discharge Cardiovascular: Normal rate, regular rhythm, S1/S2 present.  No murmur or rub heard. Pulses intact Pulmonary/Chest: Effort normal and breath sounds clear (and in RUL). No respiratory distress or retractions. Skin: multiple dark lesions on bilateral arms, 2 areas of rough, scaly patches on right arm. Psychiatric: Patient has a normal mood and affect. behavior is normal. Judgment and thought content normal.  No results found for this or any previous visit (from the past 72 hour(s)).  Assessment & Plan  1. Cough - discussed OTC treatment and warning signs of unimproved condition- will call Monday with update and will order xray and appropriate abx if needed - guaiFENesin 200 MG tablet; Take 1 tablet (200 mg total) by mouth 2 times daily at 12 noon and 4 pm.  Dispense: 30 suppository; Refill: 0 - benzonatate (TESSALON) 100 MG capsule; Take 1 capsule (100 mg total) by mouth 3 (three) times daily.  Dispense: 30 capsule; Refill: 0 - loratadine (CLARITIN) 5 MG chewable tablet; Chew 1 tablet (5 mg total) by mouth daily.  Dispense: 30 tablet; Refill: 0  2. Upper respiratory tract infection, unspecified type - discussed OTC treatment and warning signs of unimproved condition- will call Monday with update and will order xray and appropriate abx if needed - guaiFENesin 200 MG tablet; Take 1 tablet (200 mg total) by mouth 2 times daily at 12 noon and 4 pm.  Dispense: 30 suppository; Refill: 0 - benzonatate (TESSALON) 100 MG capsule; Take 1 capsule (100 mg total) by mouth 3 (three) times daily.  Dispense: 30 capsule; Refill: 0 - loratadine (CLARITIN)  5 MG chewable tablet; Chew 1 tablet (5 mg total) by mouth daily.  Dispense: 30 tablet; Refill: 0  3. AK (actinic keratosis)  - Ambulatory referral to Dermatology  4. Weight loss  patient has lost weight since last 2 visits- did have diarrhea and generally has poor diet. Discussed ensure supplementation. Will follow-up in one month to ensure weight loss is transient- will consider further workup and follow-up visit if needed  -Red flags and when to present for emergency care or RTC including fever >101.97F, chest pain, shortness of breath, new/worsening/un-resolving symptoms,  reviewed with patient at time of visit. Follow up and care instructions discussed and provided in AVS.  ------------------------------- I have reviewed this encounter including the documentation in  this note and/or discussed this patient with the provider, Sharyon Cable DNP AGNP-C. I am certifying that I agree with the content of this note as supervising physician. Baruch Gouty, MD [facility] 10/17/2017, 3:46 PM

## 2017-10-05 NOTE — Patient Instructions (Addendum)
-   Ensure supplementation   You likely have a viral upper respiratory infection (URI). Antibiotics will not reduce the number of days you are ill or prevent you from getting bacterial rhinosinusitis. A URI can take up to 14 days to resolve, but typically last between 7-11 days. Your body is so smart and strong that it will be fighting this illness off for you but it is important that you drink plenty of fluids, rest. Cover your nose/mouth when you cough or sneeze and wash your hands well and often. Here are some helpful things you can use or pick up over the counter from the pharmacy to help with your symptoms:   For Fever/Pain: Acetaminophen every 6 hours as needed (maximum of  a day). If you are still uncomfortable you can add ibuprofen OR naproxen  For coughing: try dextromethorphan for a cough suppressant, and/or a cool mist humidifier, lozenges  For sore throat: saline gargles, honey herbal tea, lozenges, throat spray  To dry out your nose: try an antihistamine like loratadine (non-sedating) or diphenhydramine (sedating) or others To relieve a stuffy nose: try an oral decongestant  Like pseudoephedrine if you are under the age of 66 and do not have high blood pressure, neti pot To make blowing your nose easier: guaifenesin

## 2017-10-17 ENCOUNTER — Encounter: Payer: Self-pay | Admitting: Psychiatry

## 2017-10-17 ENCOUNTER — Ambulatory Visit: Payer: Medicare Other | Admitting: Psychiatry

## 2017-10-17 ENCOUNTER — Other Ambulatory Visit: Payer: Self-pay

## 2017-10-17 VITALS — BP 207/64 | HR 65 | Temp 97.7°F | Wt 99.8 lb

## 2017-10-17 DIAGNOSIS — F1320 Sedative, hypnotic or anxiolytic dependence, uncomplicated: Secondary | ICD-10-CM | POA: Diagnosis not present

## 2017-10-17 DIAGNOSIS — F411 Generalized anxiety disorder: Secondary | ICD-10-CM | POA: Diagnosis not present

## 2017-10-17 MED ORDER — LORAZEPAM 0.5 MG PO TABS: 0.5000 mg | ORAL_TABLET | Freq: Every day | ORAL | 0 refills | Status: DC | PRN

## 2017-10-17 NOTE — Patient Instructions (Signed)
PLEASE START TAKING ATIVAN HALF TABLET ( 0.25 MG )  ON TUESDAYS AND THURSDAYS FOR THE NEXT THREE WEEKS . REST OF THE DAYS TAKE 0.5 MG DAILY - THAT IS ON MONDAYS , WEDNESDAYS, FRIDAYS,SATURDAYS AND SUNDAYS.  PLEASE RETURN TO CLINIC IN 3 WEEKS.
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
## 2017-11-05 ENCOUNTER — Ambulatory Visit: Payer: Medicare Other | Admitting: Gastroenterology

## 2017-11-06 DIAGNOSIS — H7012 Chronic mastoiditis, left ear: Secondary | ICD-10-CM | POA: Diagnosis not present

## 2017-11-06 DIAGNOSIS — H6123 Impacted cerumen, bilateral: Secondary | ICD-10-CM | POA: Diagnosis not present

## 2017-11-07 ENCOUNTER — Encounter: Payer: Self-pay | Admitting: Psychiatry

## 2017-11-07 ENCOUNTER — Ambulatory Visit: Payer: Medicare Other | Admitting: Psychiatry

## 2017-11-07 VITALS — BP 170/62 | HR 60 | Ht 61.0 in | Wt 97.6 lb

## 2017-11-07 DIAGNOSIS — F1320 Sedative, hypnotic or anxiolytic dependence, uncomplicated: Secondary | ICD-10-CM | POA: Diagnosis not present

## 2017-11-07 DIAGNOSIS — F411 Generalized anxiety disorder: Secondary | ICD-10-CM | POA: Diagnosis not present

## 2017-11-07 MED ORDER — LORAZEPAM 0.5 MG PO TABS: 0.2500 mg | ORAL_TABLET | Freq: Every day | ORAL | 1 refills | Status: DC | PRN

## 2017-11-07 NOTE — Patient Instructions (Signed)
PLEASE START TAKING HALF TABLET ( 0.25 MG ) ON TUESDAYS, THURSDAYS AND SATURDAYS.  PLEASE CONTINUE TAKING 0.5 MG THE OTHER DAYS .

## 2017-11-07 NOTE — Progress Notes (Signed)
BH MD  OP Progress Note  11/07/2017 1:58 PM Festus HoltsLouise B Sparks  MRN:  [id number]  Chief Complaint: ' I am here for follow up." Chief Complaint    Follow-up     HPI: Tamara Sparks is a 84 year old Caucasian female, widowed, lives in [place], has a history of anxiety, depression, hypertension, history of stroke, hearing loss, hyperlipidemia, presented to the clinic today for a follow-up visit.  Patient presented along with her daughter Tamara Sparks at [phone] who provided collateral information.   Patient was referred to the clinic here by her primary medical doctor to be weaned off of the Ativan.  Patient has been on the Ativan since the past more than 10 years or so.  Patient was given instructions to taper of Ativan during her last visit.  She reports she is tolerating the reduced dose well.  She denies any significant nervousness, sleep problems or withdrawal symptoms.  She reports she is motivated to continue to wean it off.  Patient was prescribed Zoloft by her PMD prior to coming to this clinic.  Patient however reports she has not been taking it as prescribed.  She reports she was worried about the side effects which came on the handout.  Provided medication education.  Discussed with patient to try the medication and to be compliant with it.  Patient voiced understanding.  Patient denies any suicidality.  Patient reports sleep is good.  Patient continues to have good social support system.  Patient continues to be active, exercising, taking care of her garden as well as enjoys watching TV.    Visit Diagnosis:    ICD-10-CM   1. Generalized anxiety disorder F41.1 LORazepam (ATIVAN) 0.5 MG tablet  2. Benzodiazepine dependence (HCC) F13.20     Past Psychiatric History: I have reviewed past psychiatric history from my progress note on 10/17/2017.  Past trials of Zoloft, Ativan.  Past Medical History:  Past Medical History:  Diagnosis Date  . Anxiety state, unspecified   .  Cardiac dysrhythmia, unspecified   . Depression   . Dyslipidemia, goal LDL below 130   . Dyspnea    DOE, patient does alot of work for living alone  . Dysrhythmia    patient unsure of this  . Heart murmur   . History of stroke    Right cerebellar intracerebral hemorrhage  . HOH (hard of hearing)   . Hyposmolality and/or hyponatremia   . Stroke [facility] 2012   right hand shakes and can't write well  . Tremors of nervous system    RIGHT ARM  . Tuberculosis 1947   RIGHT UPPER LOBE EXCISION AGE 66  . Unspecified essential hypertension   . Unspecified vitamin D deficiency   . Urinary tract infection, site not specified     Past Surgical History:  Procedure Laterality Date  . CARDIAC CATHETERIZATION     [facility]  . CATARACT EXTRACTION W/PHACO Right 03/08/2016   Procedure: CATARACT EXTRACTION PHACO AND INTRAOCULAR LENS PLACEMENT (IOC);  Surgeon: Sallee LangeSteven Dingeldein, MD;  Location: [facility] ORS;  Service: Ophthalmology;  Laterality: Right;   FLUID CASSETTE [id number] H, EXP 07/19/17 [place]    3:16.8   AP%   27.2 CDE 94.14  . EXTERNAL EAR SURGERY Left   . LOBECTOMY Right 1952   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[age]    Family Psychiatric History: Have reviewed family psychiatric history from my progress note on 10/17/2017.  Family History:  Family History  Problem Relation Age of Onset  . Stroke Mother   .  Hypertension Father   . Stroke Father   . Hypertension Sister   . Hypertension Brother   . Cancer Brother        brain  . Hypertension Sister   . Hypertension Brother   . Cancer Brother        stomach  Substance abuse history: Denies   Social History: Pt is widowed.  She lives in [place] by herself.  She has 4 children.  I reviewed social history from my progress note on 10/17/2017 Social History   Socioeconomic History  . Marital status: Widowed    Spouse name: [name]  . Number of children: 4  . Years of education: Not on file  . Highest education level: 9th grade  Occupational History   . Occupation: Retired  Engineer, production  . Financial resource strain: Not hard at all  . Food insecurity:    Worry: Never true    Inability: Never true  . Transportation needs:    Medical: No    Non-medical: No  Tobacco Use  . Smoking status: Never Smoker  . Smokeless tobacco: Never Used  . Tobacco comment: smoking cessation materials not required  Substance and Sexual Activity  . Alcohol use: No    Alcohol/week: 0.0 oz  . Drug use: No  . Sexual activity: Not Currently  Lifestyle  . Physical activity:    Days per week: 0 days    Minutes per session: 0 min  . Stress: Rather much  Relationships  . Social connections:    Talks on phone: Patient refused    Gets together: Patient refused    Attends religious service: Patient refused    Active member of club or organization: Patient refused    Attends meetings of clubs or organizations: Patient refused    Relationship status: Widowed  Other Topics Concern  . Not on file  Social History Narrative  . Not on file    Allergies:  Allergies  Allergen Reactions  . Penicillins Rash  . Sulfa Antibiotics Other (See Comments)    Pt does not remember but thinks that it just didn't agree with her    Metabolic Disorder Labs: Lab Results  Component Value Date   HGBA1C 5.5 09/23/2015   No results found for: PROLACTIN Lab Results  Component Value Date   CHOL 191 09/20/2017   TRIG 105 09/20/2017   HDL 55 09/20/2017   CHOLHDL 3.5 09/20/2017   VLDL 17 07/10/2016   LDLCALC 115 (H) 09/20/2017   LDLCALC 82 07/10/2016   No results found for: TSH  Therapeutic Level Labs: No results found for: LITHIUM No results found for: VALPROATE No components found for:  CBMZ  Current Medications: Current Outpatient Medications  Medication Sig Dispense Refill  . aspirin 81 MG tablet Take 81 mg by mouth daily.    . Cholecalciferol (VITAMIN D3) 5000 UNITS CAPS Take by mouth daily.    Melene Muller ON 11/12/2017] LORazepam (ATIVAN) 0.5 MG tablet  Take 0.5-1 tablets (0.25-0.5 mg total) by mouth daily as needed for anxiety. 26 tablet 1  . nebivolol (BYSTOLIC) 10 MG tablet Take 1 tablet (10 mg total) by mouth daily. For blood pressure 90 tablet 0  . sertraline (ZOLOFT) 25 MG tablet Take 1 tablet (25 mg total) by mouth daily. For mood 90 tablet 0  . simvastatin (ZOCOR) 20 MG tablet Take 1 tablet (20 mg total) by mouth daily at 6 PM. For cholesterol 90 tablet 1   No current facility-administered medications for this visit.  Musculoskeletal: Strength & Muscle Tone: within normal limits Gait & Station: normal Patient leans: N/A  Psychiatric Specialty Exam: Review of Systems  Psychiatric/Behavioral: The patient is nervous/anxious (IMPROVING).   All other systems reviewed and are negative.   Blood pressure (!) 170/62, pulse 60, height 5\' 1"  (1.549 m), weight 97 lb 9.6 oz (44.3 kg), SpO2 94 %.Body mass index is 18.44 kg/m.  General Appearance: Casual  Eye Contact:  Fair  Speech:  Normal Rate  Volume:  Normal  Mood:  Anxious improving  Affect:  Appropriate  Thought Process:  Goal Directed and Descriptions of Associations: Intact  Orientation:  Full (Time, Place, and Person)  Thought Content: Logical   Suicidal Thoughts:  No  Homicidal Thoughts:  No  Memory:  Immediate;   Fair Recent;   Fair Remote;   Fair  Judgement:  Fair  Insight:  Fair  Psychomotor Activity:  Normal  Concentration:  Concentration: Fair and Attention Span: Fair  Recall:  [employer] of Knowledge: Fair  Language: Fair  Akathisia:  No  Handed:  Right  AIMS (if indicated): na  Assets:  Communication Skills Desire for Improvement Housing Social Support  ADL's:  Intact  Cognition: WNL  Sleep:  Fair   Screenings: PHQ2-9     Clinical Support from 09/04/2017 in [facility] Office Visit from 06/19/2017 in [facility] Office Visit from 02/21/2017 in [facility] Office Visit from 12/18/2016 in [facility] Office Visit from 10/20/2016 in [facility]  PHQ-2 Total Score  2  0  3  0  0  PHQ-9 Total Score  4  -  3  -  -       Assessment and Plan: Tamara Sparks is an 84 year old Caucasian female, widowed, has a history of depression, anxiety, multiple medical problems including hypertension, history of stroke, hyperlipidemia, bilateral hearing loss, presented to the clinic today for a follow-up visit.  Patient currently is tolerating the tapering off of benzodiazepine-Ativan well.  She continues to be noncompliant with Zoloft.  Provided medication education.  Will continue plan as noted below.  Plan For GAD Zoloft 25 mg p.o. daily.  For depression Zoloft 25 mg p.o. Daily.  Benzodiazepine dependence Patient continues to taper off Ativan.  She will start taking 0.25 mg Ativan on Tuesdays, Thursdays and Saturdays.  Rest of the days she will take 0.5 mg. I have reviewed [place] controlled substance database.  Patient has been referred for psychotherapy.  Follow-up in clinic in 1 month.  More than 50 % of the time was spent for psychoeducation and supportive psychotherapy and care coordination.  This note was generated in part or whole with voice recognition software. Voice recognition is usually quite accurate but there are transcription errors that can and very often do occur. I apologize for any typographical errors that were not detected and corrected.         Jomarie Longs, MD 11/07/2017, 1:58 PM

## 2017-12-18 ENCOUNTER — Telehealth: Payer: Self-pay | Admitting: Family Medicine

## 2017-12-18 ENCOUNTER — Other Ambulatory Visit: Payer: Self-pay | Admitting: Nurse Practitioner

## 2017-12-18 DIAGNOSIS — I10 Essential (primary) hypertension: Secondary | ICD-10-CM

## 2017-12-18 NOTE — Telephone Encounter (Signed)
-----   Message from Cheryle HorsfallElizabeth E Poulose, NP sent at 12/18/2017 11:30 AM EDT ----- Regarding: Schedule routine appointment  Please schedule patient for routine appointment with Dr. Sherie DonLada between 8/2-01/21/2018

## 2017-12-18 NOTE — Telephone Encounter (Signed)
Spoke with patient and I scheduled her an appt with Dr Sherie DonLada for 01-01-18. She stated that she will be out of her medication by this Thursday and I informed her that her medication has been sent to her pharmacy. Pt thanks you

## 2017-12-26 ENCOUNTER — Telehealth: Payer: Self-pay | Admitting: Psychiatry

## 2017-12-26 ENCOUNTER — Telehealth (HOSPITAL_COMMUNITY): Payer: Self-pay

## 2017-12-26 ENCOUNTER — Encounter: Payer: Self-pay | Admitting: Psychiatry

## 2017-12-26 ENCOUNTER — Ambulatory Visit: Payer: Medicare Other | Admitting: Psychiatry

## 2017-12-26 VITALS — BP 205/67 | HR 67 | Ht 61.0 in | Wt 98.6 lb

## 2017-12-26 DIAGNOSIS — F411 Generalized anxiety disorder: Secondary | ICD-10-CM

## 2017-12-26 DIAGNOSIS — F1320 Sedative, hypnotic or anxiolytic dependence, uncomplicated: Secondary | ICD-10-CM | POA: Diagnosis not present

## 2017-12-26 DIAGNOSIS — F329 Major depressive disorder, single episode, unspecified: Secondary | ICD-10-CM

## 2017-12-26 DIAGNOSIS — F32A Depression, unspecified: Secondary | ICD-10-CM

## 2017-12-26 MED ORDER — SERTRALINE HCL 25 MG PO TABS: 25.0000 mg | ORAL_TABLET | Freq: Every day | ORAL | 0 refills | Status: DC

## 2017-12-26 NOTE — Telephone Encounter (Signed)
Patients daughter called and said that the pharmacy had the prescription Zolofy 25mg , but they didn't have the prescription for Lorazepam 0.5 mg at  [facility] on [address]. Please advise

## 2017-12-26 NOTE — Telephone Encounter (Signed)
CALLED PHARMACY , VERIFIED THAT LORAZEPAM PRESCRIPTION WAS SENT OUT ON June 19 TH. THE PHARMACY WILL FILL IT FOR THEM. BUT PLEASE LET PT KNOW TO FOLLOW THE NEW INSTRUCTIONS GIVEN TODAY WHEN SHE TAKES IT. SHE WILL TAKE 0.25 ( HALF TABLET) EVER DAY .

## 2017-12-26 NOTE — Progress Notes (Signed)
BH MD  OP Progress Note  12/26/2017 1:36 PM CARLIN MAMONE  MRN:  [id number]  Chief Complaint: ' I am here for follow up." Chief Complaint    Follow-up     HPI: Tamara Sparks is an 84 yr old Caucasian female, widowed, lives in [place], has a history of anxiety, depression, hypertension, history of stroke, hearing loss, hyperlipidemia, presented to the clinic today for a follow-up visit.  Patient today presented with her daughter-[name], who provided collateral information.  Patient today reports she has been tolerating the weaning off of the Ativan well.  She reports taking Ativan 0.25 mg 3 days a week.  She denies any withdrawal symptoms.   Patient however reports she continues to have some days when she is depressed than others.  Patient reports yesterday was a bad day for her when she spent a lot of time crying.  She reports she has been taking the Zoloft more often however there are days when she skipped the dose.  She reports that the Zoloft makes her nauseous on and off.  Discussed changing her Zoloft to another antidepressant/antianxiety agent.  Patient however reports she would like to stay on the Zoloft at this time.  Patient agrees to take the medication with meals.  Patient reports sleep is improved.  Patient denies any suicidality.  Patient appears to be alert oriented to person place time and situation.  Patient with elevated blood pressure today.  Patient reports she has an upcoming appointment with her PCP on the 13th of this month.  Discussed with patient as well as daughter to keep log of her blood pressure for the next few days follow-up with her primary medical doctor.   Visit Diagnosis:    ICD-10-CM   1. Generalized anxiety disorder F41.1 sertraline (ZOLOFT) 25 MG tablet  2. Benzodiazepine dependence (HCC) F13.20    prescribed  3. Depression, unspecified depression type F32.9     Past Psychiatric History: Have reviewed past psychiatric history from my  progress note on 10/17/2017.  Past trials of Zoloft, Ativan.  Past Medical History:  Past Medical History:  Diagnosis Date  . Anxiety state, unspecified   . Cardiac dysrhythmia, unspecified   . Depression   . Dyslipidemia, goal LDL below 130   . Dyspnea    DOE, patient does alot of work for living alone  . Dysrhythmia    patient unsure of this  . Heart murmur   . History of stroke    Right cerebellar intracerebral hemorrhage  . HOH (hard of hearing)   . Hyposmolality and/or hyponatremia   . Stroke [facility]) 2012   right hand shakes and can't write well  . Tremors of nervous system    RIGHT ARM  . Tuberculosis 1947   RIGHT UPPER LOBE EXCISION AGE 3  . Unspecified essential hypertension   . Unspecified vitamin D deficiency   . Urinary tract infection, site not specified     Past Surgical History:  Procedure Laterality Date  . CARDIAC CATHETERIZATION     [facility]  . CATARACT EXTRACTION W/PHACO Right 03/08/2016   Procedure: CATARACT EXTRACTION PHACO AND INTRAOCULAR LENS PLACEMENT (IOC);  Surgeon: Sallee Lange, MD;  Location: [facility] ORS;  Service: Ophthalmology;  Laterality: Right;   FLUID CASSETTE [id number] H, EXP 07/19/17 [place]    3:16.8   AP%   27.2 CDE 94.14  . EXTERNAL EAR SURGERY Left   . LOBECTOMY Right 1952   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17    Family Psychiatric History:  I have reviewed family psychiatric history from my progress note on 10/17/2017.  Family History:  Family History  Problem Relation Age of Onset  . Stroke Mother   . Hypertension Father   . Stroke Father   . Hypertension Sister   . Hypertension Brother   . Cancer Brother        brain  . Hypertension Sister   . Hypertension Brother   . Cancer Brother        stomach    Social History: I have reviewed social history from my progress note on 10/17/2017. Social History   Socioeconomic History  . Marital status: Widowed    Spouse name: Duanne GuessDewey  . Number of children: 4  . Years of education: Not on file   . Highest education level: 9th grade  Occupational History  . Occupation: Retired  Engineer, productionocial Needs  . Financial resource strain: Not hard at all  . Food insecurity:    Worry: Never true    Inability: Never true  . Transportation needs:    Medical: No    Non-medical: No  Tobacco Use  . Smoking status: Never Smoker  . Smokeless tobacco: Never Used  . Tobacco comment: smoking cessation materials not required  Substance and Sexual Activity  . Alcohol use: No    Alcohol/week: 0.0 oz  . Drug use: No  . Sexual activity: Not Currently  Lifestyle  . Physical activity:    Days per week: 0 days    Minutes per session: 0 min  . Stress: Rather much  Relationships  . Social connections:    Talks on phone: Patient refused    Gets together: Patient refused    Attends religious service: Patient refused    Active member of club or organization: Patient refused    Attends meetings of clubs or organizations: Patient refused    Relationship status: Widowed  Other Topics Concern  . Not on file  Social History Narrative  . Not on file    Allergies:  Allergies  Allergen Reactions  . Penicillins Rash  . Sulfa Antibiotics Other (See Comments)    Pt does not remember but thinks that it just didn't agree with her    Metabolic Disorder Labs: Lab Results  Component Value Date   HGBA1C 5.5 09/23/2015   No results found for: PROLACTIN Lab Results  Component Value Date   CHOL 191 09/20/2017   TRIG 105 09/20/2017   HDL 55 09/20/2017   CHOLHDL 3.5 09/20/2017   VLDL 17 07/10/2016   LDLCALC 115 (H) 09/20/2017   LDLCALC 82 07/10/2016   No results found for: TSH  Therapeutic Level Labs: No results found for: LITHIUM No results found for: VALPROATE No components found for:  CBMZ  Current Medications: Current Outpatient Medications  Medication Sig Dispense Refill  . aspirin 81 MG tablet Take 81 mg by mouth daily.    Marland Kitchen. BYSTOLIC 10 MG tablet TAKE 1 TABLET(10 MG) BY MOUTH DAILY FOR BLOOD  PRESSURE 90 tablet 0  . Cholecalciferol (VITAMIN D3) 5000 UNITS CAPS Take by mouth daily.    Marland Kitchen. LORazepam (ATIVAN) 0.5 MG tablet Take 0.5-1 tablets (0.25-0.5 mg total) by mouth daily as needed for anxiety. 26 tablet 1  . sertraline (ZOLOFT) 25 MG tablet Take 1 tablet (25 mg total) by mouth daily. For mood 90 tablet 0  . simvastatin (ZOCOR) 20 MG tablet Take 1 tablet (20 mg total) by mouth daily at 6 PM. For cholesterol 90 tablet 1   No current  facility-administered medications for this visit.      Musculoskeletal: Strength & Muscle Tone: within normal limits Gait & Station: normal Patient leans: N/A  Psychiatric Specialty Exam: Review of Systems  Psychiatric/Behavioral: The patient is nervous/anxious.   All other systems reviewed and are negative.   Blood pressure (!) 205/67, pulse 67, height 5\' 1"  (1.549 m), weight 98 lb 9.6 oz (44.7 kg), SpO2 96 %.Body mass index is 18.63 kg/m.  General Appearance: Casual  Eye Contact:  Fair  Speech:  Clear and Coherent  Volume:  Normal  Mood:  Anxious  Affect:  Congruent  Thought Process:  Goal Directed and Descriptions of Associations: Intact  Orientation:  Full (Time, Place, and Person)  Thought Content: Logical   Suicidal Thoughts:  No  Homicidal Thoughts:  No  Memory:  Immediate;   Fair Recent;   Fair Remote;   Fair  Judgement:  Fair  Insight:  Fair  Psychomotor Activity:  Normal  Concentration:  Concentration: Fair and Attention Span: Fair  Recall:  [employer] of Knowledge: Fair  Language: Fair  Akathisia:  No  Handed:  Right  AIMS (if indicated): na  Assets:  Communication Skills Desire for Improvement Social Support  ADL's:  Intact  Cognition: WNL  Sleep:  fair   Screenings: PHQ2-9     Clinical Support from 09/04/2017 in [facility] Office Visit from 06/19/2017 in [facility] Office Visit from 02/21/2017 in [facility] Office Visit from 12/18/2016 in [facility] Office Visit from 10/20/2016 in [facility]  PHQ-2 Total Score  2  0  3  0  0  PHQ-9 Total Score  4  -  3  -  -       Assessment and Plan: Caidence is an 84 year old Caucasian female, widowed, has a history of depression, anxiety, multiple medical problems including hypertension, history of stroke, hyperlipidemia, bilateral hearing loss, presented to the clinic today for a follow-up visit.  Patient is currently tolerating the tapering off of the Ativan well.  She continues to have some on and off anxiety/depressive symptoms.  She reports she is more compliant with her Zoloft and wants to stay on that dosage for now.  Discussed plan as noted below.  Plan For GAD Zoloft 25 mg p.o. daily  For depressive symptoms Zoloft 25 mg p.o. daily.  Provided education about the need for being compliant with medication.  Benzodiazepine dependence Will reduce the dose of Ativan to 0.25 mg daily.  I have reviewed [place] controlled substance database.  Discussed with patient as well as family all instruction and return to clinic in 1 month.  Patient with elevated blood pressure, discussed with patient to reach out to her PMD and to keep a log of her daily blood pressure.  Follow-up in clinic in 1 month.  More than 50 % of the time was spent for psychoeducation and supportive psychotherapy and care coordination.  This note was generated in part or whole with voice recognition software. Voice recognition is usually quite accurate but there are transcription errors that can and very often do occur. I apologize for any typographical errors that were not detected and corrected.       Jomarie Longs, MD 12/26/2017, 1:36 PM

## 2017-12-26 NOTE — Telephone Encounter (Signed)
Called patients daughter Bonita QuinLinda  informed her of the below message she voiced understanding

## 2017-12-26 NOTE — Patient Instructions (Signed)
Please start taking Lorazepam 0.25 mg ( half of 0.5 mg tablet) daily .

## 2017-12-28 ENCOUNTER — Ambulatory Visit: Payer: Self-pay | Admitting: Family Medicine

## 2017-12-28 ENCOUNTER — Ambulatory Visit: Payer: Self-pay | Admitting: *Deleted

## 2017-12-28 NOTE — Telephone Encounter (Signed)
Pt's daughter called to make an appointment for her mom. Stated the EMTs had been there because the patient thought she was having a heart attack because she was sweating.  No other cardiac symptoms.  She had not taken her b/p medication this morning but did take it after taking by the emt and getting  a b/p of 208/67.  Appointment scheduled per protocol. [facility].  Reason for Disposition . [1] Systolic BP  >= 200 OR Diastolic >= 120  AND [2] having NO cardiac or neurologic symptoms  Answer Assessment - Initial Assessment Questions 1. BLOOD PRESSURE: "What is the blood pressure?" "Did you take at least two measurements 5 minutes apart?"     208/67 taken by emt 2. ONSET: "When did you take your blood pressure?"     This morning 3. HOW: "How did you obtain the blood pressure?" (e.g., visiting nurse, automatic home BP monitor)     Automatic BP 4. HISTORY: "Do you have a history of high blood pressure?"     yes 5. MEDICATIONS: "Are you taking any medications for blood pressure?" "Have you missed any doses recently?"     Took medications after EMT took b/p. Otherwise have not missed any medications. 6. OTHER SYMPTOMS: "Do you have any symptoms?" (e.g., headache, chest pain, blurred vision, difficulty breathing, weakness)     sweating 7. PREGNANCY: "Is there any chance you are pregnant?" "When was your last menstrual period?"     no  Protocols used: HIGH BLOOD PRESSURE-A-AH

## 2017-12-29 ENCOUNTER — Emergency Department
Admission: EM | Admit: 2017-12-29 | Discharge: 2017-12-29 | Disposition: A | Discharge status: Home / Self Care | Payer: Medicare Other | Attending: Emergency Medicine | Admitting: Emergency Medicine

## 2017-12-29 ENCOUNTER — Emergency Department: Payer: Medicare Other

## 2017-12-29 DIAGNOSIS — R51 Headache: Secondary | ICD-10-CM | POA: Diagnosis not present

## 2017-12-29 DIAGNOSIS — Z79899 Other long term (current) drug therapy: Secondary | ICD-10-CM | POA: Diagnosis not present

## 2017-12-29 DIAGNOSIS — R251 Tremor, unspecified: Secondary | ICD-10-CM | POA: Diagnosis not present

## 2017-12-29 DIAGNOSIS — Z7982 Long term (current) use of aspirin: Secondary | ICD-10-CM | POA: Insufficient documentation

## 2017-12-29 DIAGNOSIS — I10 Essential (primary) hypertension: Secondary | ICD-10-CM | POA: Insufficient documentation

## 2017-12-29 LAB — CBC
HCT: 38.5 % (ref 35.0–47.0)
Hemoglobin: 13.3 g/dL (ref 12.0–16.0)
MCH: 30.9 pg (ref 26.0–34.0)
MCHC: 34.7 g/dL (ref 32.0–36.0)
MCV: 89.2 fL (ref 80.0–100.0)
Platelets: 315 10*3/uL (ref 150–440)
RBC: 4.31 MIL/uL (ref 3.80–5.20)
RDW: 14.1 % (ref 11.5–14.5)
WBC: 8.7 10*3/uL (ref 3.6–11.0)

## 2017-12-29 LAB — BASIC METABOLIC PANEL
ANION GAP: 8 (ref 5–15)
BUN: 13 mg/dL (ref 8–23)
CALCIUM: 9.2 mg/dL (ref 8.9–10.3)
CHLORIDE: 100 mmol/L (ref 98–111)
CO2: 27 mmol/L (ref 22–32)
CREATININE: 0.81 mg/dL (ref 0.44–1.00)
GFR calc non Af Amer: >60 mL/min (ref >60)
Glucose, Bld: 104 mg/dL — ABNORMAL HIGH (ref 70–99)
Potassium: 3.7 mmol/L (ref 3.5–5.1)
SODIUM: 135 mmol/L (ref 135–145)

## 2017-12-29 LAB — TROPONIN I

## 2017-12-29 MED ORDER — CLONIDINE HCL 0.1 MG PO TABS: 0.1000 mg | ORAL_TABLET | Freq: Every day | ORAL | 0 refills | Status: DC | PRN

## 2017-12-29 MED ORDER — LORAZEPAM 2 MG/ML IJ SOLN
0.5000 mg | Freq: Once | INTRAMUSCULAR | Status: AC
  Administered 2017-12-29: 0.5 mg via INTRAVENOUS
  Filled 2017-12-29: qty 1

## 2017-12-29 MED ORDER — CLONIDINE HCL 0.1 MG PO TABS
0.1000 mg | ORAL_TABLET | Freq: Once | ORAL | Status: DC
  Filled 2017-12-29: qty 1

## 2017-12-29 NOTE — ED Notes (Signed)
Pt in radiology at this time. 

## 2017-12-29 NOTE — ED Provider Notes (Signed)
[facility] Emergency Department Provider Note  Time seen: 12:48 PM  I have reviewed the triage vital signs and the nursing notes.   HISTORY  Chief Complaint Hypertension    HPI Tamara Sparks is a 84 y.o. female with a past medical history of anxiety, CVA, hypertension, presents to the emergency department today for high blood pressure.  According to EMS patient checks her blood pressure at home, had called EMS to her house yesterday because of high blood pressure 207 systolic, ultimately decided not to go to the hospital as she has a doctor's appointment this coming Tuesday.  Patient states today she was feeling very shaky so she checked her blood pressure and it was 225, she was also feeling a double pressure sensation across her head so she called EMS to bring her to the hospital.  Here the patient states she just feels so "worried" and she has been shaking all day.  Patient has a history of anxiety prescribed Lorazepam.  States several days ago her doctor began weaning her off of the medication was taking a full tablet several times a day is now taking half a tablet.  Denies any weakness, numbness confusion or slurred speech.  No chest pain or trouble breathing.   Past Medical History:  Diagnosis Date  . Anxiety state, unspecified   . Cardiac dysrhythmia, unspecified   . Depression   . Dyslipidemia, goal LDL below 130   . Dyspnea    DOE, patient does alot of work for living alone  . Dysrhythmia    patient unsure of this  . Heart murmur   . History of stroke    Right cerebellar intracerebral hemorrhage  . HOH (hard of hearing)   . Hyposmolality and/or hyponatremia   . Stroke [facility]) 2012   right hand shakes and can't write well  . Tremors of nervous system    RIGHT ARM  . Tuberculosis 1947   RIGHT UPPER LOBE EXCISION AGE 6  . Unspecified essential hypertension   . Unspecified vitamin D deficiency   . Urinary tract infection, site not specified      Patient Active Problem List   Diagnosis Date Noted  . History of TB (tuberculosis) 10/05/2017  . History of lobectomy of lung 10/05/2017  . Dyslipidemia 09/23/2015  . Hyperglycemia 09/23/2015  . Seasonal allergies 09/09/2015  . Dyslipidemia, goal LDL below 130 03/16/2015  . Anxiety disorder 12/08/2014  . Rapid heart beat 01/23/2014  . Diastolic murmur 01/23/2014  . Essential hypertension 01/23/2014    Past Surgical History:  Procedure Laterality Date  . CARDIAC CATHETERIZATION     [facility]  . CATARACT EXTRACTION W/PHACO Right 03/08/2016   Procedure: CATARACT EXTRACTION PHACO AND INTRAOCULAR LENS PLACEMENT (IOC);  Surgeon: Sallee LangeSteven Dingeldein, MD;  Location: [facility] ORS;  Service: Ophthalmology;  Laterality: Right;   FLUID CASSETTE [id number] H, EXP 07/19/17 [place]    3:16.8   AP%   27.2 CDE 94.14  . EXTERNAL EAR SURGERY Left   . LOBECTOMY Right 1952   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17    Prior to Admission medications   Medication Sig Start Date End Date Taking? Authorizing Provider  aspirin 81 MG tablet Take 81 mg by mouth daily.    [provider]  BYSTOLIC 10 MG tablet TAKE 1 TABLET(10 MG) BY MOUTH DAILY FOR BLOOD PRESSURE 12/18/17   Poulose, Percell BeltElizabeth E, NP  Cholecalciferol (VITAMIN D3) 5000 UNITS CAPS Take by mouth daily.    [provider]  LORazepam (ATIVAN) 0.5 MG tablet Take 0.5-1 tablets (0.25-0.5 mg total) by mouth daily as needed for anxiety. 11/12/17   Jomarie Longs, MD  sertraline (ZOLOFT) 25 MG tablet Take 1 tablet (25 mg total) by mouth daily. For mood 12/26/17   Jomarie Longs, MD  simvastatin (ZOCOR) 20 MG tablet Take 1 tablet (20 mg total) by mouth daily at 6 PM. For cholesterol 09/20/17   Poulose, Percell Belt, NP    Allergies  Allergen Reactions  . Penicillins Rash  . Sulfa Antibiotics Other (See Comments)    Pt does not remember but thinks that it just didn't agree with her    Family History  Problem Relation Age of Onset  . Stroke Mother   .  Hypertension Father   . Stroke Father   . Hypertension Sister   . Hypertension Brother   . Cancer Brother        brain  . Hypertension Sister   . Hypertension Brother   . Cancer Brother        stomach    Social History Social History   Tobacco Use  . Smoking status: Never Smoker  . Smokeless tobacco: Never Used  . Tobacco comment: smoking cessation materials not required  Substance Use Topics  . Alcohol use: No    Alcohol/week: 0.0 standard drinks  . Drug use: No    Review of Systems Constitutional: Negative for fever. Eyes: Negative for visual complaints ENT: Negative for recent illness/congestion Cardiovascular: Negative for chest pain. Respiratory: Negative for shortness of breath. Gastrointestinal: Negative for abdominal pain, vomiting  Genitourinary: Negative for urinary compaints Musculoskeletal: Negative for musculoskeletal complaints Skin: Negative for skin complaints  Neurological: Mild dull pressure across head per patient.  No weakness or numbness. All other ROS negative  ____________________________________________   PHYSICAL EXAM:  Constitutional: Alert and oriented. Well appearing and in no distress. Eyes: Normal exam ENT   Head: Normocephalic and atraumatic.   Mouth/Throat: Mucous membranes are moist. Cardiovascular: Normal rate, regular rhythm.  Respiratory: Normal respiratory effort without tachypnea nor retractions. Breath sounds are clear  Gastrointestinal: Soft and nontender. No distention.  Musculoskeletal: Nontender with normal range of motion in all extremities. No lower extremity tenderness or edema. Neurologic:  Normal speech and language. No gross focal neurologic deficits  Skin:  Skin is warm, dry and intact.  Psychiatric: Mood and affect are normal.   ____________________________________________    EKG  EKG reviewed and interpreted by myself shows normal sinus rhythm at 59 bpm, narrow QRS, normal axis, largely normal  intervals, nonspecific ST changes but no ST elevation.  ____________________________________________    RADIOLOGY  ct negative  ____________________________________________   INITIAL IMPRESSION / ASSESSMENT AND PLAN / ED COURSE  Pertinent labs & imaging results that were available during my care of the patient were reviewed by me and considered in my medical decision making (see chart for details).  Patient presents to the emergency department for elevated blood pressure, states she feels very "worried."  Patient also states a dull pressure across her head.  Current blood pressure 224/95.  Overall the patient appears well otherwise though, denies any chest pain or trouble breathing, no weakness, numbness confusion or slurred speech.  Good neurological examination, normal physical examination.  Patient does appear anxious.  I suspect the patient's recent decrease in lorazepam could be contributing to her symptoms as well.  We will dose a small dose of IV Lorazepam to see if this helps and her blood pressure reduction.  We will check  labs including cardiac enzymes and a CT scan of the head as a precaution.  Patient agreeable to plan of care.  Differential this time would include hypertension, essential versus secondary, CVA/ICH, ACS, anxiety.  Patient's lab work has resulted largely within normal limits including a negative troponin and normal kidney function.  CT scan of the head is negative.  Patient's blood pressure currently 160s over 90s.  Daughter is here with the patient.  States she has had a lot of anxiety recently especially from watching the news.  I had a long discussion with the patient and daughter about ways to reduce stress, not checking her blood pressure on a daily basis, not watching the news or other anxiety provoking situations, which the daughter states causes her a lot of stress.  With her blood pressure being elevated as high as 230 per daughter at home today I do believe  having APRN prescription for 0.1 mg of clonidine would be helpful to the patient.  She is currently on a single blood pressure medication bisystolic.  Patient agreeable to this plan of care.  Daughter agreeable.  ____________________________________________   FINAL CLINICAL IMPRESSION(S) / ED DIAGNOSES  hypertension    Minna Antis, MD 12/29/17 1456

## 2017-12-29 NOTE — ED Triage Notes (Signed)
Pt arrives from home states HTN for several days and her head started feelilng "funny" today. Pt is tearful, states severe anxiety and PCP is "weaning down her Ativan." Physician bedside.

## 2018-01-01 ENCOUNTER — Encounter: Payer: Self-pay | Admitting: Family Medicine

## 2018-01-01 ENCOUNTER — Ambulatory Visit (INDEPENDENT_AMBULATORY_CARE_PROVIDER_SITE_OTHER): Payer: Medicare Other | Admitting: Family Medicine

## 2018-01-01 DIAGNOSIS — I10 Essential (primary) hypertension: Secondary | ICD-10-CM | POA: Diagnosis not present

## 2018-01-01 DIAGNOSIS — F411 Generalized anxiety disorder: Secondary | ICD-10-CM

## 2018-01-01 MED ORDER — CHLORTHALIDONE 25 MG PO TABS: 25.0000 mg | ORAL_TABLET | Freq: Every day | ORAL | 1 refills | Status: DC

## 2018-01-01 NOTE — Assessment & Plan Note (Signed)
Being managed by psychiatrist; I explained to the family member that I will not prescribe the benzo (I was directly asked); this is being weaned apparently by psych; she will talk to the psychiatrist about the SSRI issue and benzo issue

## 2018-01-01 NOTE — Patient Instructions (Addendum)
Start the new blood pressure medicine today Continue the bystolic as you are taking it Continue the clonidine as prescribed in the ER  Try to follow the DASH guidelines (DASH stands for Dietary Approaches to Stop Hypertension). Try to limit the sodium in your diet to no more than 1,500mg  of sodium per day. Certainly try to not exceed 2,000 mg per day at the very most. Do not add salt when cooking or at the table.  Check the sodium amount on labels when shopping, and choose items lower in sodium when given a choice. Avoid or limit foods that already contain a lot of sodium. Eat a diet rich in fruits and vegetables and whole grains, and try to lose weight if overweight or obese  Buy frozen or fresh vegetables -- NOT canned    DASH Eating Plan DASH stands for "Dietary Approaches to Stop Hypertension." The DASH eating plan is a healthy eating plan that has been shown to reduce high blood pressure (hypertension). It may also reduce your risk for type 2 diabetes, heart disease, and stroke. The DASH eating plan may also help with weight loss. What are tips for following this plan? General guidelines  Avoid eating more than 2,300 mg (milligrams) of salt (sodium) a day. If you have hypertension, you may need to reduce your sodium intake to 1,500 mg a day.  Limit alcohol intake to no more than 1 drink a day for nonpregnant women and 2 drinks a day for men. One drink equals 12 oz of beer, 5 oz of wine, or 1 oz of hard liquor.  Work with your health care provider to maintain a healthy body weight or to lose weight. Ask what an ideal weight is for you.  Get at least 30 minutes of exercise that causes your heart to beat faster (aerobic exercise) most days of the week. Activities may include walking, swimming, or biking.  Work with your health care provider or diet and nutrition specialist (dietitian) to adjust your eating plan to your individual calorie needs. Reading food labels  Check food labels for  the amount of sodium per serving. Choose foods with less than 5 percent of the Daily Value of sodium. Generally, foods with less than 300 mg of sodium per serving fit into this eating plan.  To find whole grains, look for the word "whole" as the first word in the ingredient list. Shopping  Buy products labeled as "low-sodium" or "no salt added."  Buy fresh foods. Avoid canned foods and premade or frozen meals. Cooking  Avoid adding salt when cooking. Use salt-free seasonings or herbs instead of table salt or sea salt. Check with your health care provider or pharmacist before using salt substitutes.  Do not fry foods. Cook foods using healthy methods such as baking, boiling, grilling, and broiling instead.  Cook with heart-healthy oils, such as olive, canola, soybean, or sunflower oil. Meal planning   Eat a balanced diet that includes: ? 5 or more servings of fruits and vegetables each day. At each meal, try to fill half of your plate with fruits and vegetables. ? Up to 6-8 servings of whole grains each day. ? Less than 6 oz of lean meat, poultry, or fish each day. A 3-oz serving of meat is about the same size as a deck of cards. One egg equals 1 oz. ? 2 servings of low-fat dairy each day. ? A serving of nuts, seeds, or beans 5 times each week. ? Heart-healthy fats. Healthy fats called  Omega-3 fatty acids are found in foods such as flaxseeds and coldwater fish, like sardines, salmon, and mackerel.  Limit how much you eat of the following: ? Canned or prepackaged foods. ? Food that is high in trans fat, such as fried foods. ? Food that is high in saturated fat, such as fatty meat. ? Sweets, desserts, sugary drinks, and other foods with added sugar. ? Full-fat dairy products.  Do not salt foods before eating.  Try to eat at least 2 vegetarian meals each week.  Eat more home-cooked food and less restaurant, buffet, and fast food.  When eating at a restaurant, ask that your food be  prepared with less salt or no salt, if possible. What foods are recommended? The items listed may not be a complete list. Talk with your dietitian about what dietary choices are best for you. Grains Whole-grain or whole-wheat bread. Whole-grain or whole-wheat pasta. Brown rice. [place]. Bulgur. Whole-grain and low-sodium cereals. Pita bread. Low-fat, low-sodium crackers. Whole-wheat flour tortillas. Vegetables Fresh or frozen vegetables (raw, steamed, roasted, or grilled). Low-sodium or reduced-sodium tomato and vegetable juice. Low-sodium or reduced-sodium tomato sauce and tomato paste. Low-sodium or reduced-sodium canned vegetables. Fruits All fresh, dried, or frozen fruit. Canned fruit in natural juice (without added sugar). Meat and other protein foods Skinless chicken or [place]. Ground chicken or [place]. Pork with fat trimmed off. Fish and seafood. Egg whites. Dried beans, peas, or lentils. Unsalted nuts, nut butters, and seeds. Unsalted canned beans. Lean cuts of beef with fat trimmed off. Low-sodium, lean deli meat. Dairy Low-fat (1%) or fat-free (skim) milk. Fat-free, low-fat, or reduced-fat cheeses. Nonfat, low-sodium ricotta or cottage cheese. Low-fat or nonfat yogurt. Low-fat, low-sodium cheese. Fats and oils Soft margarine without trans fats. Vegetable oil. Low-fat, reduced-fat, or light mayonnaise and salad dressings (reduced-sodium). Canola, safflower, olive, soybean, and sunflower oils. Avocado. Seasoning and other foods Herbs. Spices. Seasoning mixes without salt. Unsalted popcorn and pretzels. Fat-free sweets. What foods are not recommended? The items listed may not be a complete list. Talk with your dietitian about what dietary choices are best for you. Grains Baked goods made with fat, such as croissants, muffins, or some breads. Dry pasta or rice meal packs. Vegetables Creamed or fried vegetables. Vegetables in a cheese sauce. Regular canned vegetables (not  low-sodium or reduced-sodium). Regular canned tomato sauce and paste (not low-sodium or reduced-sodium). Regular tomato and vegetable juice (not low-sodium or reduced-sodium). Angie Fava. Olives. Fruits Canned fruit in a light or heavy syrup. Fried fruit. Fruit in cream or butter sauce. Meat and other protein foods Fatty cuts of meat. Ribs. Fried meat. Berniece Salines. Sausage. Bologna and other processed lunch meats. Salami. Fatback. Hotdogs. Bratwurst. Salted nuts and seeds. Canned beans with added salt. Canned or smoked fish. Whole eggs or egg yolks. Chicken or [place] with skin. Dairy Whole or 2% milk, cream, and half-and-half. Whole or full-fat cream cheese. Whole-fat or sweetened yogurt. Full-fat cheese. Nondairy creamers. Whipped toppings. Processed cheese and cheese spreads. Fats and oils Butter. Stick margarine. Lard. Shortening. Ghee. Bacon fat. Tropical oils, such as coconut, palm kernel, or palm oil. Seasoning and other foods Salted popcorn and pretzels. Onion salt, garlic salt, seasoned salt, table salt, and sea salt. Worcestershire sauce. Tartar sauce. Barbecue sauce. Teriyaki sauce. Soy sauce, including reduced-sodium. Steak sauce. Canned and packaged gravies. Fish sauce. Oyster sauce. Cocktail sauce. Horseradish that you find on the shelf. Ketchup. Mustard. Meat flavorings and tenderizers. Bouillon cubes. Hot sauce and Tabasco sauce. Premade or packaged marinades. Premade  or packaged taco seasonings. Relishes. Regular salad dressings. Where to find more information:  National Heart, Lung, and Blood Institute: PopSteam.iswww.nhlbi.nih.gov  American Heart Association: www.heart.org Summary  The DASH eating plan is a healthy eating plan that has been shown to reduce high blood pressure (hypertension). It may also reduce your risk for type 2 diabetes, heart disease, and stroke.  With the DASH eating plan, you should limit salt (sodium) intake to 2,300 mg a day. If you have hypertension, you may need to reduce  your sodium intake to 1,500 mg a day.  When on the DASH eating plan, aim to eat more fresh fruits and vegetables, whole grains, lean proteins, low-fat dairy, and heart-healthy fats.  Work with your health care provider or diet and nutrition specialist (dietitian) to adjust your eating plan to your individual calorie needs. This information is not intended to replace advice given to you by your health care provider. Make sure you discuss any questions you have with your health care provider. Document Released: 04/27/2011 Document Revised: 05/01/2016 Document Reviewed: 05/01/2016 Elsevier Interactive Patient Education  [employer]2018 [employer].

## 2018-01-01 NOTE — Assessment & Plan Note (Signed)
Continue bystolic, but will not increase as heart rate is 60; will add chlorthalidone, 12.5 mg today, then 25 mg tomorrow and daily thereafter (hand-written instructions on the AVS); return in two days for recheck; discussed risk of stroke; reviewed signs/symptoms, reasons to call 911, time frame to get help to possible save brain tissue; she will continue the clonidine PRN; discussed anxiety, and encouraged her to not get drawn into any drama around her, think of other things she can do to do with anxiety, calmly breathe in and then out, etc.

## 2018-01-01 NOTE — Progress Notes (Signed)
BP (!) 206/78 (BP Location: Left Arm)   Pulse 60   Temp 97.7 F (36.5 C)   Ht 5\' 1"  (1.549 m)   Wt 98 lb 6.4 oz (44.6 kg)   SpO2 95%   BMI 18.59 kg/m    Subjective:    Patient ID: Tamara Sparks, female    DOB: [birth year], 84 y.o.   MRN: [id number]  HPI: Tamara Sparks is a 84 y.o. female  Chief Complaint  Patient presents with  . Follow-up  . Hypertension    running really high  . Depression    stopped zoloft not working making her worse    HPI  Hypertension Was recently in ER on 8/10 for the same. Has noticed blood pressures over 200 states thinks its due to increased anxiety and has been working on weaning down on ativan. ER rx clonidine 0.1mg  to take for systolic blood pressure over 200. Yesterday her systolic blood pressure 204- took 1/2 tab of the clonidine and an hour later it was 177/58. This morning BP was 200/59 took another 1/2 tab clonidine at 10am. She takes bystolic 10mg  daily; states she took 2 of these pills Friday morning when she called EMS states BP was 208- systolic.  Denies headaches, chest pain, vision changes  Endorses nausea intermittently 2-3 days; family states she complains about this on and off for years.  Dr. Elna Breslow is prescribing ativan, just one a day; patient is worried about them taking away her ativan; the family member with her asked if I could or would prescribe ativan (I said no) When I asked the patient what other things she could do for anxiety other than take a pill, like painting, drawing, word search, listen to music, etc, she said none of those, "that's not reality" The psychiatrist started her on sertraline, but she is not taking it; family member asked about it, and I instructed her to contact the prescriber about any side effects or problems with the sertraline  Depression screen [facility] 2/9 01/01/2018 09/04/2017 06/19/2017 02/21/2017 12/18/2016  Decreased Interest 0 1 0 1 0  Down, Depressed, Hopeless 3 1 0 2 0  PHQ - 2 Score 3 2 0  3 0  Altered sleeping 0 0 - 0 -  Tired, decreased energy 1 1 - 0 -  Change in appetite 1 1 - 0 -  Feeling bad or failure about yourself  0 0 - 0 -  Trouble concentrating 0 0 - 0 -  Moving slowly or fidgety/restless 1 0 - 0 -  Suicidal thoughts 0 0 - 0 -  PHQ-9 Score 6 4 - 3 -  Difficult doing work/chores Not difficult at all Not difficult at all - Not difficult at all -    Relevant past medical, surgical, family and social history reviewed Past Medical History:  Diagnosis Date  . Anxiety state, unspecified   . Cardiac dysrhythmia, unspecified   . Depression   . Dyslipidemia, goal LDL below 130   . Dyspnea    DOE, patient does alot of work for living alone  . Dysrhythmia    patient unsure of this  . Heart murmur   . History of stroke    Right cerebellar intracerebral hemorrhage  . HOH (hard of hearing)   . Hyposmolality and/or hyponatremia   . Stroke [facility]) 2012   right hand shakes and can't write well  . Tremors of nervous system    RIGHT ARM  . Tuberculosis 1947   RIGHT UPPER  LOBE EXCISION AGE 34  . Unspecified essential hypertension   . Unspecified vitamin D deficiency   . Urinary tract infection, site not specified    Past Surgical History:  Procedure Laterality Date  . CARDIAC CATHETERIZATION     [facility]  . CATARACT EXTRACTION W/PHACO Right 03/08/2016   Procedure: CATARACT EXTRACTION PHACO AND INTRAOCULAR LENS PLACEMENT (IOC);  Surgeon: Sallee LangeSteven Dingeldein, MD;  Location: [facility] ORS;  Service: Ophthalmology;  Laterality: Right;   FLUID CASSETTE [id number] H, EXP 07/19/17 US    3:16.8   AP%   27.2 CDE 94.14  . EXTERNAL EAR SURGERY Left   . LOBECTOMY Right 1952   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17   Family History  Problem Relation Age of Onset  . Stroke Mother   . Hypertension Father   . Stroke Father   . Hypertension Sister   . Hypertension Brother   . Cancer Brother        brain  . Hypertension Sister   . Hypertension Brother   . Cancer Brother        stomach    Social History   Tobacco Use  . Smoking status: Never Smoker  . Smokeless tobacco: Never Used  . Tobacco comment: smoking cessation materials not required  Substance Use Topics  . Alcohol use: No    Alcohol/week: 0.0 standard drinks  . Drug use: No    Interim medical history since last visit reviewed. Allergies and medications reviewed  Review of Systems Per HPI unless specifically indicated above     Objective:    BP (!) 206/78 (BP Location: Left Arm)   Pulse 60   Temp 97.7 F (36.5 C)   Ht 5\' 1"  (1.549 m)   Wt 98 lb 6.4 oz (44.6 kg)   SpO2 95%   BMI 18.59 kg/m   Wt Readings from Last 3 Encounters:  01/01/18 98 lb 6.4 oz (44.6 kg)  12/29/17 98 lb 8.7 oz (44.7 kg)  10/05/17 96 lb 3.2 oz (43.6 kg)    Physical Exam  Constitutional: She appears well-developed and well-nourished.  Small framed elderly female; NAD; weight stable  HENT:  Mouth/Throat: Mucous membranes are normal.  Eyes: EOM are normal. No scleral icterus.  Cardiovascular: Normal rate and regular rhythm.  Pulmonary/Chest: Effort normal and breath sounds normal.  Psychiatric: Her behavior is normal. Her mood appears anxious.  Good eye contact with examiner      Assessment & Plan:   Problem List Items Addressed This Visit      Cardiovascular and Mediastinum   Essential hypertension (Chronic)    Continue bystolic, but will not increase as heart rate is 60; will add chlorthalidone, 12.5 mg today, then 25 mg tomorrow and daily thereafter (hand-written instructions on the AVS); return in two days for recheck; discussed risk of stroke; reviewed signs/symptoms, reasons to call 911, time frame to get help to possible save brain tissue; she will continue the clonidine PRN; discussed anxiety, and encouraged her to not get drawn into any drama around her, think of other things she can do to do with anxiety, calmly breathe in and then out, etc.      Relevant Medications   chlorthalidone (HYGROTON) 25 MG  tablet     Other   Anxiety disorder    Being managed by psychiatrist; I explained to the family member that I will not prescribe the benzo (I was directly asked); this is being weaned apparently by psych; she will talk to the psychiatrist about the  SSRI issue and benzo issue         DASH guidelines encouraged; discussed risk of canned vegetables, use fresh or frozen instead, etc  Follow up plan: Return in about 2 days (around 01/03/2018) for follow-up visit with Dr. Sherie DonLada.  An after-visit summary was printed and given to the patient at check-out.  Please see the patient instructions which may contain other information and recommendations beyond what is mentioned above in the assessment and plan.  Meds ordered this encounter  Medications  . chlorthalidone (HYGROTON) 25 MG tablet    Sig: Take 1 tablet (25 mg total) by mouth daily.    Dispense:  30 tablet    Refill:  1    No orders of the defined types were placed in this encounter.

## 2018-01-02 NOTE — Telephone Encounter (Signed)
Late entry---- I called and spoke with daughter on 12-26-17

## 2018-01-03 ENCOUNTER — Ambulatory Visit (INDEPENDENT_AMBULATORY_CARE_PROVIDER_SITE_OTHER): Payer: Medicare Other | Admitting: Family Medicine

## 2018-01-03 ENCOUNTER — Encounter: Payer: Self-pay | Admitting: Family Medicine

## 2018-01-03 ENCOUNTER — Telehealth: Payer: Self-pay | Admitting: Psychiatry

## 2018-01-03 VITALS — BP 168/74 | HR 60 | Temp 98.3°F | Wt 93.9 lb

## 2018-01-03 DIAGNOSIS — F419 Anxiety disorder, unspecified: Secondary | ICD-10-CM | POA: Diagnosis not present

## 2018-01-03 DIAGNOSIS — I10 Essential (primary) hypertension: Secondary | ICD-10-CM | POA: Diagnosis not present

## 2018-01-03 DIAGNOSIS — R634 Abnormal weight loss: Secondary | ICD-10-CM

## 2018-01-03 DIAGNOSIS — F411 Generalized anxiety disorder: Secondary | ICD-10-CM | POA: Diagnosis not present

## 2018-01-03 MED ORDER — CLONIDINE HCL 0.1 MG PO TABS: 0.1000 mg | ORAL_TABLET | Freq: Two times a day (BID) | ORAL | 0 refills | Status: DC

## 2018-01-03 NOTE — Telephone Encounter (Signed)
-----   Message from Kerman PasseyMelinda P Lada, MD sent at 01/03/2018  9:50 AM EDT ----- Regarding: Patient's BP quite high coming down on Ativan Greetings! I'm seeing our mutual patient. Please see record of ER visit, very high blood pressure lately. She is not tolerating zoloft (dry mouth and hot flushes and made her cry) and has stopped that. BP is hard to manage now and just wondering if keeping her at previous dose of Ativan for now might be an option. Patient and family think that the anxiety is causing this BP elevation. Thank you so much, Juliette AlcideMelinda

## 2018-01-03 NOTE — Assessment & Plan Note (Signed)
Patient and family member believe anxiety related to fear of upcoming discontinuation of benzo is causing her BP to be high; they will monitor BP; continue the thiazide and beta-blocker; will have her continue the clonidine now regularly, not just PRN; hold clonidine if systolic less than 145 mmHg though; discussed having her see nephrologist for work-up; after talking about her weight loss, will also get thyroid tests to see if this is related to her weight loss and anxiety

## 2018-01-03 NOTE — Progress Notes (Signed)
BP (!) 168/74   Pulse 60   Temp 98.3 F (36.8 C)   Wt 93 lb 14.4 oz (42.6 kg)   SpO2 97%   BMI 17.74 kg/m    Subjective:    Patient ID: Tamara Sparks, female    DOB: [birth year], 84 y.o.   MRN: [id number]  HPI: Tamara HoltsLouise B Krist is a 84 y.o. female  Chief Complaint  Patient presents with  . Follow-up    HPI Patient is here for f/u of hypertension When sitting and resting, BP comes down No stairs in her home Able to get dressed without getting worn No stress in the home No extra salt No canned foods Sticking right to the diet She has been taking the clonidine PRN systolic over 200 We reviewed her previous BP readings over the last year She and her family believe her high BP readings are because of the Ativan being decreased She sees a psychiatrist for her anxiety Weight loss noted, and we actually discussed this by phone shortly after her appointment; she says she used to weigh 115 pounds; review of her chart shows 96 pounds in May  [facility] 01/01/2018 09/04/2017 06/19/2017 02/21/2017 12/18/2016  Decreased Interest 0 1 0 1 0  Down, Depressed, Hopeless 3 1 0 2 0  PHQ - 2 Score 3 2 0 3 0  Altered sleeping 0 0 - 0 -  Tired, decreased energy 1 1 - 0 -  Change in appetite 1 1 - 0 -  Feeling bad or failure about yourself  0 0 - 0 -  Trouble concentrating 0 0 - 0 -  Moving slowly or fidgety/restless 1 0 - 0 -  Suicidal thoughts 0 0 - 0 -  PHQ-9 Score 6 4 - 3 -  Difficult doing work/chores Not difficult at all Not difficult at all - Not difficult at all -    Relevant past medical, surgical, family and social history reviewed Past Medical History:  Diagnosis Date  . Anxiety state, unspecified   . Cardiac dysrhythmia, unspecified   . Depression   . Dyslipidemia, goal LDL below 130   . Dyspnea    DOE, patient does alot of work for living alone  . Dysrhythmia    patient unsure of this  . Heart murmur   . History of stroke    Right cerebellar  intracerebral hemorrhage  . HOH (hard of hearing)   . Hyposmolality and/or hyponatremia   . Stroke [facility](HCC) 2012   right hand shakes and can't write well  . Tremors of nervous system    RIGHT ARM  . Tuberculosis 1947   RIGHT UPPER LOBE EXCISION AGE 51  . Unspecified essential hypertension   . Unspecified vitamin D deficiency   . Urinary tract infection, site not specified    Past Surgical History:  Procedure Laterality Date  . CARDIAC CATHETERIZATION     [facility]  . CATARACT EXTRACTION W/PHACO Right 03/08/2016   Procedure: CATARACT EXTRACTION PHACO AND INTRAOCULAR LENS PLACEMENT (IOC);  Surgeon: Sallee LangeSteven Dingeldein, MD;  Location: [facility] ORS;  Service: Ophthalmology;  Laterality: Right;   FLUID CASSETTE [id number] H, EXP 07/19/17 [place]    3:16.8   AP%   27.2 CDE 94.14  . EXTERNAL EAR SURGERY Left   . LOBECTOMY Right 1952   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17   Family History  Problem Relation Age of Onset  . Stroke Mother   . Hypertension Father   . Stroke Father   .  Hypertension Sister   . Hypertension Brother   . Cancer Brother        brain  . Hypertension Sister   . Hypertension Brother   . Cancer Brother        stomach   Social History   Tobacco Use  . Smoking status: Never Smoker  . Smokeless tobacco: Never Used  . Tobacco comment: smoking cessation materials not required  Substance Use Topics  . Alcohol use: No    Alcohol/week: 0.0 standard drinks  . Drug use: No    Interim medical history since last visit reviewed. Allergies and medications reviewed  Review of Systems Per HPI unless specifically indicated above     Objective:    BP (!) 168/74   Pulse 60   Temp 98.3 F (36.8 C)   Wt 93 lb 14.4 oz (42.6 kg)   SpO2 97%   BMI 17.74 kg/m   Wt Readings from Last 3 Encounters:  01/03/18 93 lb 14.4 oz (42.6 kg)  01/01/18 98 lb 6.4 oz (44.6 kg)  12/29/17 98 lb 8.7 oz (44.7 kg)    Physical Exam  Constitutional: She appears well-developed and well-nourished.  Thin,  small female in no distress  HENT:  Mouth/Throat: Mucous membranes are normal.  Eyes: EOM are normal. No scleral icterus.  Cardiovascular: Normal rate and regular rhythm.  Pulmonary/Chest: Effort normal and breath sounds normal.  Abdominal: Soft. Bowel sounds are normal. She exhibits no distension and no abdominal bruit. There is no tenderness.  Psychiatric: She has a normal mood and affect. Her behavior is normal.    Results for orders placed or performed during the hospital encounter of 12/29/17  CBC  Result Value Ref Range   WBC 8.7 3.6 - 11.0 K/uL   RBC 4.31 3.80 - 5.20 MIL/uL   Hemoglobin 13.3 12.0 - 16.0 g/dL   HCT 16.1 09.6 - 04.5 %   MCV 89.2 80.0 - 100.0 fL   MCH 30.9 26.0 - 34.0 pg   MCHC 34.7 32.0 - 36.0 g/dL   RDW 40.9 81.1 - 91.4 %   Platelets 315 150 - 440 K/uL  Basic metabolic panel  Result Value Ref Range   Sodium 135 135 - 145 mmol/L   Potassium 3.7 3.5 - 5.1 mmol/L   Chloride 100 98 - 111 mmol/L   CO2 27 22 - 32 mmol/L   Glucose, Bld 104 (H) 70 - 99 mg/dL   BUN 13 8 - 23 mg/dL   Creatinine, Ser 7.82 0.44 - 1.00 mg/dL   Calcium 9.2 8.9 - 95.6 mg/dL   GFR calc non Af Amer >60 >60 mL/min   GFR calc Af Amer >60 >60 mL/min   Anion gap 8 5 - 15  Troponin I  Result Value Ref Range   Troponin I <0.03 <0.03 ng/mL      Assessment & Plan:   Problem List Items Addressed This Visit      Cardiovascular and Mediastinum   Essential hypertension - Primary (Chronic)    Patient and family member believe anxiety related to fear of upcoming discontinuation of benzo is causing her BP to be high; they will monitor BP; continue the thiazide and beta-blocker; will have her continue the clonidine now regularly, not just PRN; hold clonidine if systolic less than 145 mmHg though; discussed having her see nephrologist for work-up; after talking about her weight loss, will also get thyroid tests to see if this is related to her weight loss and anxiety  Relevant Medications    cloNIDine (CATAPRES) 0.1 MG tablet     Other   Anxiety disorder    Patient very anxious about the possibility of having to discontiue the benzo; I will send a note to psychiatrist to see what she thinks; the mere thought of losing her Ativan is causing her significant anxiety       Other Visit Diagnoses    Anxiety       Relevant Orders   TSH   T4, free   T3, free   Weight loss       discussed with patient by phone after visit; will check thyroid labs; reviewed other labs from ER; malignancy possible esp given her age; close f/u   Relevant Orders   TSH   T4, free   T3, free   COMPLETE METABOLIC PANEL WITH GFR       Follow up plan: Return in about 1 week (around 01/10/2018) for blood pressure and weight and pulse recheck with CMA.  An after-visit summary was printed and given to the patient at check-out.  Please see the patient instructions which may contain other information and recommendations beyond what is mentioned above in the assessment and plan.  Meds ordered this encounter  Medications  . cloNIDine (CATAPRES) 0.1 MG tablet    Sig: Take 1 tablet (0.1 mg total) by mouth 2 (two) times daily. Do not take if top BP is less than 145    Dispense:  60 tablet    Refill:  0    Orders Placed This Encounter  Procedures  . TSH  . T4, free  . T3, free  . COMPLETE METABOLIC PANEL WITH GFR   [place] in [place]; she will let sister, patient know to come back for labs; ordering thyroid, don't see done in ER [phone]  I spoke with patient; she will come back today for labs

## 2018-01-03 NOTE — Telephone Encounter (Signed)
Attempted to call patient to discuss her Ativan dosage after receiving message from her PCP Dr.Lada. Left message to call back.

## 2018-01-03 NOTE — Patient Instructions (Signed)
Please call if any issues with the blood pressure Return in one week for BP recheck

## 2018-01-03 NOTE — Assessment & Plan Note (Signed)
Patient very anxious about the possibility of having to discontiue the benzo; I will send a note to psychiatrist to see what she thinks; the mere thought of losing her Ativan is causing her significant anxiety

## 2018-01-04 ENCOUNTER — Other Ambulatory Visit: Payer: Self-pay | Admitting: Family Medicine

## 2018-01-04 DIAGNOSIS — I10 Essential (primary) hypertension: Secondary | ICD-10-CM

## 2018-01-04 DIAGNOSIS — E871 Hypo-osmolality and hyponatremia: Secondary | ICD-10-CM

## 2018-01-04 DIAGNOSIS — E878 Other disorders of electrolyte and fluid balance, not elsewhere classified: Secondary | ICD-10-CM

## 2018-01-04 DIAGNOSIS — E876 Hypokalemia: Secondary | ICD-10-CM

## 2018-01-04 LAB — COMPLETE METABOLIC PANEL WITH GFR
AG Ratio: 1.4 (calc) (ref 1.0–2.5)
ALKALINE PHOSPHATASE (APISO): 71 U/L (ref 33–130)
ALT: 8 U/L (ref 6–29)
AST: 16 U/L (ref 10–35)
Albumin: 3.9 g/dL (ref 3.6–5.1)
BILIRUBIN TOTAL: 0.6 mg/dL (ref 0.2–1.2)
BUN / CREAT RATIO: 13 (calc) (ref 6–22)
BUN: 12 mg/dL (ref 7–25)
CO2: 32 mmol/L (ref 20–32)
CREATININE: 0.9 mg/dL — AB (ref 0.60–0.88)
Calcium: 9.7 mg/dL (ref 8.6–10.4)
Chloride: 93 mmol/L — ABNORMAL LOW (ref 98–110)
GFR, Est African American: 67 mL/min/{1.73_m2} (ref >=60)
GFR, Est Non African American: 57 mL/min/{1.73_m2} — ABNORMAL LOW (ref >=60)
GLUCOSE: 133 mg/dL (ref 65–139)
Globulin: 2.8 g/dL (calc) (ref 1.9–3.7)
Potassium: 3.4 mmol/L — ABNORMAL LOW (ref 3.5–5.3)
Sodium: 132 mmol/L — ABNORMAL LOW (ref 135–146)
Total Protein: 6.7 g/dL (ref 6.1–8.1)

## 2018-01-04 LAB — T4, FREE: FREE T4: 1.1 ng/dL (ref 0.8–1.8)

## 2018-01-04 LAB — T3, FREE: T3 FREE: 2.9 pg/mL (ref 2.3–4.2)

## 2018-01-04 LAB — TSH: TSH: 1.76 m[IU]/L (ref 0.40–4.50)

## 2018-01-04 MED ORDER — AMLODIPINE BESYLATE 5 MG PO TABS: 5.0000 mg | ORAL_TABLET | Freq: Every day | ORAL | 0 refills | Status: DC

## 2018-01-04 MED ORDER — POTASSIUM CHLORIDE ER 10 MEQ PO TBCR: 10.0000 meq | EXTENDED_RELEASE_TABLET | Freq: Every day | ORAL | 0 refills | Status: DC

## 2018-01-04 NOTE — Progress Notes (Signed)
Stopped chlorthalidone

## 2018-01-04 NOTE — Progress Notes (Signed)
Ordering renal artery US Add KCl until recheck BMP next week Refer to nephrologist Stop chlorthalidone Start amlodipine Continue Bystolic

## 2018-01-07 ENCOUNTER — Other Ambulatory Visit: Payer: Self-pay | Admitting: Family Medicine

## 2018-01-07 NOTE — Telephone Encounter (Signed)
Refill request for potassium received; I just prescribed this on Friday; she should not be out; denied

## 2018-01-09 ENCOUNTER — Other Ambulatory Visit: Payer: Self-pay | Admitting: Family Medicine

## 2018-01-10 ENCOUNTER — Ambulatory Visit: Payer: Medicare Other

## 2018-01-10 ENCOUNTER — Other Ambulatory Visit: Payer: Self-pay | Admitting: Family Medicine

## 2018-01-10 VITALS — BP 180/72 | HR 55

## 2018-01-10 DIAGNOSIS — I10 Essential (primary) hypertension: Secondary | ICD-10-CM

## 2018-01-10 MED ORDER — AMLODIPINE BESYLATE 10 MG PO TABS: 10.0000 mg | ORAL_TABLET | Freq: Every day | ORAL | 3 refills | Status: DC

## 2018-01-10 NOTE — Progress Notes (Signed)
Rx amlo

## 2018-01-10 NOTE — Patient Instructions (Addendum)
Increase Amlodopine to 10mg , you may double up dose of 5mg  and new rx was sent to the phamacy.  Please make sure Renal ultrasound gets set up asap, that can be scheduled by calling [phone].

## 2018-01-16 ENCOUNTER — Encounter: Payer: Self-pay | Admitting: *Deleted

## 2018-01-16 ENCOUNTER — Telehealth: Payer: Self-pay | Admitting: *Deleted

## 2018-01-16 NOTE — Telephone Encounter (Signed)
Left voicemail to take

## 2018-01-16 NOTE — Telephone Encounter (Signed)
This encounter was created in error - please disregard.

## 2018-01-16 NOTE — Telephone Encounter (Signed)
Pt's daughter calling to report pt's BP 133/57;  taken few minutes ago at drugstore. Pt is questioning if she should take her Amlodipine 10 mg. She usually takes this at 1200 noon. States she has already taken bystolic and ativan. Daughter states pt "Thinks BP is too low to take more medication. " Pt is asymptomatic.   Please advise: CB [phone]. May leave message.

## 2018-01-17 ENCOUNTER — Ambulatory Visit: Payer: Medicare Other

## 2018-01-17 ENCOUNTER — Ambulatory Visit
Admission: RE | Admit: 2018-01-17 | Discharge: 2018-01-17 | Disposition: A | Discharge status: Home / Self Care | Payer: Medicare Other | Source: Ambulatory Visit | Attending: Family Medicine | Admitting: Family Medicine

## 2018-01-17 DIAGNOSIS — I10 Essential (primary) hypertension: Secondary | ICD-10-CM | POA: Insufficient documentation

## 2018-01-17 DIAGNOSIS — I700 Atherosclerosis of aorta: Secondary | ICD-10-CM | POA: Diagnosis not present

## 2018-01-17 DIAGNOSIS — N281 Cyst of kidney, acquired: Secondary | ICD-10-CM | POA: Diagnosis not present

## 2018-01-17 DIAGNOSIS — I150 Renovascular hypertension: Secondary | ICD-10-CM | POA: Diagnosis not present

## 2018-01-20 ENCOUNTER — Encounter: Payer: Self-pay | Admitting: Nurse Practitioner

## 2018-01-20 DIAGNOSIS — I700 Atherosclerosis of aorta: Secondary | ICD-10-CM | POA: Insufficient documentation

## 2018-01-20 DIAGNOSIS — Q6102 Congenital multiple renal cysts: Secondary | ICD-10-CM

## 2018-01-20 HISTORY — DX: Congenital multiple renal cysts: Q61.02

## 2018-01-25 ENCOUNTER — Ambulatory Visit: Payer: Medicare Other | Admitting: Psychiatry

## 2018-02-15 ENCOUNTER — Other Ambulatory Visit: Payer: Self-pay

## 2018-02-15 NOTE — Patient Outreach (Signed)
[facility]) Care Management  02/15/2018  ANNALYSA MOHAMMAD 1928-11-01 [id number]   Medication Adherence call to Mrs. Loiuse Nunnery spoke with patient she ask if we can call [facility] and order Simvastatin 20 mg, [facility] will have it ready for patient to pick up. Mrs. Kooy is showing past due under [place] [employer].   Lillia Abed CPhT Pharmacy Technician [facility] Management Direct Dial [phone]  Fax [phone] [email or web address]

## 2018-02-20 ENCOUNTER — Telehealth: Payer: Self-pay | Admitting: Family Medicine

## 2018-02-20 NOTE — Telephone Encounter (Signed)
Copied from CRM [id number]. Topic: Quick Communication - Rx Refill/Question >> Feb 20, 2018  8:23 AM Alexander Bergeron B wrote: Pt was given a partial refill; pt is needing a full refill; contact pt's daughter to advise  Medication: BYSTOLIC 10 MG tablet [[id number]]    Has the patient contacted their pharmacy? Yes.   (Agent: If no, request that the patient contact the pharmacy for the refill.) (Agent: If yes, when and what did the pharmacy advise?)  Preferred Pharmacy (with phone number or street name): [facility]  Agent: Please be advised that RX refills may take up to 3 business days. We ask that you follow-up with your pharmacy.

## 2018-02-20 NOTE — Telephone Encounter (Signed)
Appointment for this, appt with Lanora Manis is fine, r/o UTI, consider referral to urologist, bedside commode, etc She should not be out of bystolic; please review the date of the last prescription

## 2018-02-20 NOTE — Telephone Encounter (Signed)
Notified daughter and appt scheduled for Tamara Sparks on Friday.

## 2018-02-20 NOTE — Telephone Encounter (Signed)
Daughter notified. Told to contact pharmacy but did want to let us know that mom is having swelling in feet and ankles. Daughter also stated that patient is not drinking water because when she does, she is not able to make it to the restroom in time and ends up voiding on herself and refuses to wear any type of depends/briefs.

## 2018-02-22 ENCOUNTER — Encounter: Payer: Self-pay | Admitting: Psychiatry

## 2018-02-22 ENCOUNTER — Ambulatory Visit: Payer: Medicare Other | Admitting: Psychiatry

## 2018-02-22 ENCOUNTER — Encounter: Payer: Self-pay | Admitting: Nurse Practitioner

## 2018-02-22 ENCOUNTER — Ambulatory Visit (INDEPENDENT_AMBULATORY_CARE_PROVIDER_SITE_OTHER): Payer: Medicare Other | Admitting: Nurse Practitioner

## 2018-02-22 VITALS — BP 140/60 | HR 72 | Temp 97.5°F | Resp 16 | Ht 61.0 in | Wt 96.6 lb

## 2018-02-22 VITALS — BP 150/70 | HR 72 | Ht 60.25 in | Wt 98.4 lb

## 2018-02-22 DIAGNOSIS — R600 Localized edema: Secondary | ICD-10-CM

## 2018-02-22 DIAGNOSIS — I10 Essential (primary) hypertension: Secondary | ICD-10-CM | POA: Diagnosis not present

## 2018-02-22 DIAGNOSIS — F1320 Sedative, hypnotic or anxiolytic dependence, uncomplicated: Secondary | ICD-10-CM

## 2018-02-22 DIAGNOSIS — F320 Major depressive disorder, single episode, mild: Secondary | ICD-10-CM

## 2018-02-22 DIAGNOSIS — R350 Frequency of micturition: Secondary | ICD-10-CM | POA: Diagnosis not present

## 2018-02-22 DIAGNOSIS — F411 Generalized anxiety disorder: Secondary | ICD-10-CM

## 2018-02-22 DIAGNOSIS — R4689 Other symptoms and signs involving appearance and behavior: Secondary | ICD-10-CM | POA: Diagnosis not present

## 2018-02-22 DIAGNOSIS — R4189 Other symptoms and signs involving cognitive functions and awareness: Secondary | ICD-10-CM

## 2018-02-22 LAB — BASIC METABOLIC PANEL WITH GFR
BUN: 12 mg/dL (ref 7–25)
CALCIUM: 10.2 mg/dL (ref 8.6–10.4)
CO2: 31 mmol/L (ref 20–32)
Chloride: 100 mmol/L (ref 98–110)
Creat: 0.81 mg/dL (ref 0.60–0.88)
GFR, EST AFRICAN AMERICAN: 76 mL/min/{1.73_m2} (ref >=60)
GFR, EST NON AFRICAN AMERICAN: 65 mL/min/{1.73_m2} (ref >=60)
Glucose, Bld: 96 mg/dL (ref 65–139)
POTASSIUM: 4.2 mmol/L (ref 3.5–5.3)
Sodium: 138 mmol/L (ref 135–146)

## 2018-02-22 LAB — POCT URINALYSIS DIPSTICK
Appearance: NORMAL
BILIRUBIN UA: NEGATIVE
Blood, UA: NEGATIVE
Glucose, UA: NEGATIVE
KETONES UA: NEGATIVE
Leukocytes, UA: NEGATIVE
Nitrite, UA: NEGATIVE
ODOR: NORMAL
PH UA: 6.5 (ref 5.0–8.0)
Protein, UA: NEGATIVE
Spec Grav, UA: 1.01 (ref 1.010–1.025)
UROBILINOGEN UA: 0.2 U/dL

## 2018-02-22 MED ORDER — NEBIVOLOL HCL 10 MG PO TABS: ORAL_TABLET | ORAL | 0 refills | Status: DC

## 2018-02-22 MED ORDER — MIRTAZAPINE 7.5 MG PO TABS: 7.5000 mg | ORAL_TABLET | Freq: Every day | ORAL | 0 refills | Status: DC

## 2018-02-22 MED ORDER — CLONIDINE HCL 0.1 MG PO TABS: 0.1000 mg | ORAL_TABLET | Freq: Two times a day (BID) | ORAL | 0 refills | Status: DC

## 2018-02-22 MED ORDER — LORAZEPAM 0.5 MG PO TABS: 0.5000 mg | ORAL_TABLET | Freq: Every day | ORAL | 1 refills | Status: DC | PRN

## 2018-02-22 NOTE — Progress Notes (Signed)
[facility] MD OP Progress Note  02/22/2018 9:17 AM Tamara Sparks  MRN:  [id number]  Chief Complaint: ' I am here for follow up." Chief Complaint    Follow-up     HPI: Tamara Sparks is an 84 yr old Caucasian female, widowed, lives in [place], has a history of anxiety, depression, hypertension, history of stroke, hearing loss, hyperlipidemia, presented to the clinic today for a follow-up visit.  Patient today presented along with her daughter-Tamara Sparks who provided collateral information.  Patient today reports she stopped taking the Zoloft a long time ago .  She reports she did not feel like the Zoloft was helping.  She denies any side effects to the Zoloft.  She has a history of being noncompliant on Zoloft in the past and was advised to take it on a daily basis during her last visit with Clinical research associate in august .    She however did not follow recommendations.  She continues to have anxiety symptoms.  She is a Product/process development scientist.  She worries about things that she hears on the news, babies left in hot cars and so on.  She became tearful when she discussed it.  She also reports sadness, lack of motivation, sleep problems, appetite changes and so on.  Some time was spent providing supportive therapy.  Discussed adding mirtazapine since she reports she also has restless sleep.  She agreed with plan.  Patient is currently undergoing treatment for elevated blood pressure.  She has an appointment with her primary medical doctor today.  She is currently on 3 different antihypertensive medications.  She does reports some swelling of her foot ever since being on the new medication.  Her blood pressure however today seems to be improved compared to her last visits.  Patient denies any suicidality.  Patient denies any perceptual disturbances.  Patient is alert, oriented to person place situation.  She was able to answer all my questions appropriately.  Her daughter provided collateral information.   Visit Diagnosis:   ICD-10-CM   1. Generalized anxiety disorder F41.1 mirtazapine (REMERON) 7.5 MG tablet    LORazepam (ATIVAN) 0.5 MG tablet  2. MDD (major depressive disorder), single episode, mild (HCC) F32.0   3. Benzodiazepine dependence (HCC) F13.20     Past Psychiatric History: Reviewed past psychiatric history from my progress note on 10/17/2017.  Past trials of Zoloft, Ativan.  Past Medical History:  Past Medical History:  Diagnosis Date  . Anxiety state, unspecified   . Cardiac dysrhythmia, unspecified   . Depression   . Dyslipidemia, goal LDL below 130   . Dyspnea    DOE, patient does alot of work for living alone  . Dysrhythmia    patient unsure of this  . Heart murmur   . History of stroke    Right cerebellar intracerebral hemorrhage  . HOH (hard of hearing)   . Hyposmolality and/or hyponatremia   . Stroke [facility]) 2012   right hand shakes and can't write well  . Tremors of nervous system    RIGHT ARM  . Tuberculosis 1947   RIGHT UPPER LOBE EXCISION AGE 16  . Unspecified essential hypertension   . Unspecified vitamin D deficiency   . Urinary tract infection, site not specified     Past Surgical History:  Procedure Laterality Date  . CARDIAC CATHETERIZATION     [facility]  . CATARACT EXTRACTION W/PHACO Right 03/08/2016   Procedure: CATARACT EXTRACTION PHACO AND INTRAOCULAR LENS PLACEMENT (IOC);  Surgeon: Sallee Lange, MD;  Location: [facility] ORS;  Service: Ophthalmology;  Laterality: Right;   FLUID CASSETTE [id number] H, EXP 07/19/17 [place]    3:16.8   AP%   27.2 CDE 94.14  . EXTERNAL EAR SURGERY Left   . LOBECTOMY Right 1952   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64    Family Psychiatric History: Have reviewed family psychiatric history from my progress note on 10/17/2017  Family History:  Family History  Problem Relation Age of Onset  . Stroke Mother   . Hypertension Father   . Stroke Father   . Hypertension Sister   . Hypertension Brother   . Cancer Brother        brain  . Hypertension  Sister   . Hypertension Brother   . Cancer Brother        stomach    Social History: I have reviewed social history from my progress note on 10/17/2017. Social History   Socioeconomic History  . Marital status: Widowed    Spouse name: [name]  . Number of children: 4  . Years of education: Not on file  . Highest education level: 9th grade  Occupational History  . Occupation: Retired  Engineer, production  . Financial resource strain: Not hard at all  . Food insecurity:    Worry: Never true    Inability: Never true  . Transportation needs:    Medical: No    Non-medical: No  Tobacco Use  . Smoking status: Never Smoker  . Smokeless tobacco: Never Used  . Tobacco comment: smoking cessation materials not required  Substance and Sexual Activity  . Alcohol use: No    Alcohol/week: 0.0 standard drinks  . Drug use: No  . Sexual activity: Not Currently  Lifestyle  . Physical activity:    Days per week: 0 days    Minutes per session: 0 min  . Stress: Rather much  Relationships  . Social connections:    Talks on phone: Patient refused    Gets together: Patient refused    Attends religious service: Patient refused    Active member of club or organization: Patient refused    Attends meetings of clubs or organizations: Patient refused    Relationship status: Widowed  Other Topics Concern  . Not on file  Social History Narrative  . Not on file    Allergies:  Allergies  Allergen Reactions  . Sertraline   . Penicillins Rash    Has patient had a PCN reaction causing immediate rash, facial/tongue/throat swelling, SOB or lightheadedness with hypotension: Yes Has patient had a PCN reaction causing severe rash involving mucus membranes or skin necrosis: No Has patient had a PCN reaction that required hospitalization: No Has patient had a PCN reaction occurring within the last 10 years: No If all of the above answers are "NO", then may proceed with Cephalosporin use.  . Sulfa Antibiotics  Other (See Comments)    Pt does not remember but thinks that it just didn't agree with her    Metabolic Disorder Labs: Lab Results  Component Value Date   HGBA1C 5.5 09/23/2015   No results found for: PROLACTIN Lab Results  Component Value Date   CHOL 191 09/20/2017   TRIG 105 09/20/2017   HDL 55 09/20/2017   CHOLHDL 3.5 09/20/2017   VLDL 17 07/10/2016   LDLCALC 115 (H) 09/20/2017   LDLCALC 82 07/10/2016   Lab Results  Component Value Date   TSH 1.76 01/03/2018    Therapeutic Level Labs: No results found for: LITHIUM No results found for:  VALPROATE No components found for:  CBMZ  Current Medications: Current Outpatient Medications  Medication Sig Dispense Refill  . amLODipine (NORVASC) 10 MG tablet Take 1 tablet (10 mg total) by mouth daily. 90 tablet 3  . BYSTOLIC 10 MG tablet TAKE 1 TABLET(10 MG) BY MOUTH DAILY FOR BLOOD PRESSURE 90 tablet 0  . cloNIDine (CATAPRES) 0.1 MG tablet TAKE 1 TABLET(0.1 MG) BY MOUTH TWICE DAILY. DO NOT TAKE IF TOP BLOOD PRESSURE IS LESS THAN 145 180 tablet 0  . LORazepam (ATIVAN) 0.5 MG tablet Take 1 tablet (0.5 mg total) by mouth daily as needed for anxiety. 30 tablet 1  . simvastatin (ZOCOR) 20 MG tablet Take 1 tablet (20 mg total) by mouth daily at 6 PM. For cholesterol 90 tablet 1  . aspirin 81 MG tablet Take 81 mg by mouth daily.    . Cholecalciferol (VITAMIN D3) 5000 UNITS CAPS Take 1 capsule by mouth daily.     . mirtazapine (REMERON) 7.5 MG tablet Take 1 tablet (7.5 mg total) by mouth at bedtime. For mood and sleep 30 tablet 0  . potassium chloride (KLOR-CON 10) 10 MEQ tablet Take 1 tablet (10 mEq total) by mouth daily. (Patient not taking: Reported on 02/22/2018) 30 tablet 0   No current facility-administered medications for this visit.      Musculoskeletal: Strength & Muscle Tone: within normal limits Gait & Station: normal Patient leans: N/A  Psychiatric Specialty Exam: Review of Systems  Psychiatric/Behavioral: The patient  is nervous/anxious and has insomnia.   All other systems reviewed and are negative.   Blood pressure (!) 150/70, pulse 72, height 5' 0.25" (1.53 m), weight 98 lb 6.4 oz (44.6 kg), SpO2 98 %.Body mass index is 19.06 kg/m.  General Appearance: Casual  Eye Contact:  Fair  Speech:  Clear and Coherent  Volume:  Normal  Mood:  Anxious  Affect:  Congruent  Thought Process:  Goal Directed and Descriptions of Associations: Intact  Orientation:  Full (Time, Place, and Person)  Thought Content: Logical   Suicidal Thoughts:  No  Homicidal Thoughts:  No  Memory:  Immediate;   Fair Recent;   Fair Remote;   Fair  Judgement:  Fair  Insight:  Fair  Psychomotor Activity:  Normal  Concentration:  Concentration: Fair and Attention Span: Fair  Recall:  [employer] of Knowledge: Fair  Language: Fair  Akathisia:  No  Handed:  Right  AIMS (if indicated): na  Assets:  Communication Skills Desire for Improvement Social Support  ADL's:  Intact  Cognition: WNL  Sleep:  restless   Screenings: PHQ2-9     Office Visit from 01/01/2018 in [facility] Clinical Support from 09/04/2017 in [facility] Office Visit from 06/19/2017 in [facility] Office Visit from 02/21/2017 in [facility] Office Visit from 12/18/2016 in [facility]  PHQ-2 Total Score  3  2  0  3  0  PHQ-9 Total Score  6  4  -  3  -       Assessment and Plan: Noheli is an 84 yr old Caucasian female, widowed, has a history of depression, anxiety, multiple medical problems including hypertension, history of stroke, hyperlipidemia, bilateral hearing loss, presented to the clinic today for a follow-up visit.  Patient was noncompliant with Zoloft.  She also was diagnosed with hyponatremia and hypokalemia secondary to her previous antihypertensive medication.  Patient is currently being managed by her primary medical doctor for  her medical problems.  She  continues to struggle with some mood symptoms as well as sleep problems.  Discussed continuing lorazepam as well as adding mirtazapine.  Continue plan as noted below.  Plan For GAD Continue lorazepam, restart her previous dosage of 0.5 mg daily as needed. Start Remeron 7.5 mg p.o. nightly Discontinue Zoloft .  For depressive symptoms Remeron 7.5 mg p.o. nightly  For benzodiazepine dependence Restart lorazepam at 0.5 mg daily as needed. Reviewed [place] controlled substance database. Discussed with patient as well as daughter that her medication can be tapered down once she is more stable on her Remeron as well as her blood pressure is more under control.  Follow up in clinic in 2 weeks or sooner if needed.  More than 50 % of the time was spent for psychoeducation and supportive psychotherapy and care coordination.  This note was generated in part or whole with voice recognition software. Voice recognition is usually quite accurate but there are transcription errors that can and very often do occur. I apologize for any typographical errors that were not detected and corrected.       Jomarie Longs, MD 02/22/2018, 9:17 AM

## 2018-02-22 NOTE — Patient Instructions (Signed)
Mirtazapine tablets What is this medicine? MIRTAZAPINE (mir TAZ a peen) is used to treat depression. This medicine may be used for other purposes; ask your health care provider or pharmacist if you have questions. COMMON BRAND NAME(S): Remeron What should I tell my health care provider before I take this medicine? They need to know if you have any of these conditions: -bipolar disorder -glaucoma -kidney disease -liver disease -suicidal thoughts -an unusual or allergic reaction to mirtazapine, other medicines, foods, dyes, or preservatives -pregnant or trying to get pregnant -breast-feeding How should I use this medicine? Take this medicine by mouth with a glass of water. Follow the directions on the prescription label. Take your medicine at regular intervals. Do not take your medicine more often than directed. Do not stop taking this medicine suddenly except upon the advice of your doctor. Stopping this medicine too quickly may cause serious side effects or your condition may worsen. A special MedGuide will be given to you by the pharmacist with each prescription and refill. Be sure to read this information carefully each time. Talk to your pediatrician regarding the use of this medicine in children. Special care may be needed. Overdosage: If you think you have taken too much of this medicine contact a poison control center or emergency room at once. NOTE: This medicine is only for you. Do not share this medicine with others. What if I miss a dose? If you miss a dose, take it as soon as you can. If it is almost time for your next dose, take only that dose. Do not take double or extra doses. What may interact with this medicine? Do not take this medicine with any of the following medications: -linezolid -MAOIs like Carbex, Eldepryl, Marplan, Nardil, and Parnate -methylene blue (injected into a vein) This medicine may also interact with the following medications: -alcohol -antiviral  medicines for HIV or AIDS -certain medicines that treat or prevent blood clots like warfarin -certain medicines for depression, anxiety, or psychotic disturbances -certain medicines for fungal infections like ketoconazole and itraconazole -certain medicines for migraine headache like almotriptan, eletriptan, frovatriptan, naratriptan, rizatriptan, sumatriptan, zolmitriptan -certain medicines for seizures like carbamazepine or phenytoin -certain medicines for sleep -cimetidine -erythromycin -fentanyl -lithium -medicines for blood pressure -nefazodone -rasagiline -rifampin -supplements like St. John's wort, kava kava, valerian -tramadol -tryptophan This list may not describe all possible interactions. Give your health care provider a list of all the medicines, herbs, non-prescription drugs, or dietary supplements you use. Also tell them if you smoke, drink alcohol, or use illegal drugs. Some items may interact with your medicine. What should I watch for while using this medicine? Tell your doctor if your symptoms do not get better or if they get worse. Visit your doctor or health care professional for regular checks on your progress. Because it may take several weeks to see the full effects of this medicine, it is important to continue your treatment as prescribed by your doctor. Patients and their families should watch out for new or worsening thoughts of suicide or depression. Also watch out for sudden changes in feelings such as feeling anxious, agitated, panicky, irritable, hostile, aggressive, impulsive, severely restless, overly excited and hyperactive, or not being able to sleep. If this happens, especially at the beginning of treatment or after a change in dose, call your health care professional. You may get drowsy or dizzy. Do not drive, use machinery, or do anything that needs mental alertness until you know how this medicine affects you. Do not   stand or sit up quickly, especially if  you are an older patient. This reduces the risk of dizzy or fainting spells. Alcohol may interfere with the effect of this medicine. Avoid alcoholic drinks. This medicine may cause dry eyes and blurred vision. If you wear contact lenses you may feel some discomfort. Lubricating drops may help. See your eye doctor if the problem does not go away or is severe. Your mouth may get dry. Chewing sugarless gum or sucking hard candy, and drinking plenty of water may help. Contact your doctor if the problem does not go away or is severe. What side effects may I notice from receiving this medicine? Side effects that you should report to your doctor or health care professional as soon as possible: -allergic reactions like skin rash, itching or hives, swelling of the face, lips, or tongue -anxious -changes in vision -chest pain -confusion -elevated mood, decreased need for sleep, racing thoughts, impulsive behavior -eye pain -fast, irregular heartbeat -feeling faint or lightheaded, falls -feeling agitated, angry, or irritable -fever or chills, sore throat -hallucination, loss of contact with reality -loss of balance or coordination -mouth sores -redness, blistering, peeling or loosening of the skin, including inside the mouth -restlessness, pacing, inability to keep still -seizures -stiff muscles -suicidal thoughts or other mood changes -trouble passing urine or change in the amount of urine -trouble sleeping -unusual bleeding or bruising -unusually weak or tired -vomiting Side effects that usually do not require medical attention (report to your doctor or health care professional if they continue or are bothersome): -change in appetite -constipation -dizziness -dry mouth -muscle aches or pains -nausea -tired -weight gain This list may not describe all possible side effects. Call your doctor for medical advice about side effects. You may report side effects to FDA at 1-800-FDA-1088. Where  should I keep my medicine? Keep out of the reach of children. Store at room temperature between 15 and 30 degrees C (59 and 86 degrees F) Protect from light and moisture. Throw away any unused medicine after the expiration date. NOTE: This sheet is a summary. It may not cover all possible information. If you have questions about this medicine, talk to your doctor, pharmacist, or health care provider.  2018 Elsevier/Gold Standard (2015-10-07 17:30:45)  

## 2018-02-22 NOTE — Patient Instructions (Signed)
-   Stop the amlodipine; come some time next week with blood pressure cuff to re-check blood pressure and ensure cuff is accurate.  - Dr. Sherie Don made Referral to Dr. Thedore Mins for hypertension managment ; please call to schedule appointment to manage hypertension: [facility] [phone].

## 2018-02-22 NOTE — Progress Notes (Signed)
Name: Tamara Sparks   MRN: [id number]    DOB: 1929-04-10   Date:02/22/2018       Progress Note  Subjective  Chief Complaint  Chief Complaint  Patient presents with  . Urinary Tract Infection    HPI  Presents daughter Tamara Sparks, notes urinary frequency and change in behavior- some crying episodes; just saw psychiatrist today and medications were changed. Also notes leg swelling since she started amlodipine, states that she takes half a pill if systolic is over 409. Swelling goes down when legs are elevated, and not swollen in the mornings. Did not taken amlodipine last night  No shortness of breath, chest pain, pain in legs, bruising, headaches, dysuria.   BP Readings from Last 3 Encounters:  02/22/18 140/60  02/22/18 (!) 150/70  01/10/18 (!) 180/72     Patient Active Problem List   Diagnosis Date Noted  . Aortic atherosclerosis (HCC) 01/20/2018  . Multiple renal cysts 01/20/2018  . History of TB (tuberculosis) 10/05/2017  . History of lobectomy of lung 10/05/2017  . Dyslipidemia 09/23/2015  . Hyperglycemia 09/23/2015  . Seasonal allergies 09/09/2015  . Dyslipidemia, goal LDL below 130 03/16/2015  . Anxiety disorder 12/08/2014  . Rapid heart beat 01/23/2014  . Diastolic murmur 01/23/2014  . Essential hypertension 01/23/2014    Past Medical History:  Diagnosis Date  . Anxiety state, unspecified   . Cardiac dysrhythmia, unspecified   . Depression   . Dyslipidemia, goal LDL below 130   . Dyspnea    DOE, patient does alot of work for living alone  . Dysrhythmia    patient unsure of this  . Heart murmur   . History of stroke    Right cerebellar intracerebral hemorrhage  . HOH (hard of hearing)   . Hyposmolality and/or hyponatremia   . Stroke [facility]) 2012   right hand shakes and can't write well  . Tremors of nervous system    RIGHT ARM  . Tuberculosis 1947   RIGHT UPPER LOBE EXCISION AGE 41  . Unspecified essential hypertension   . Unspecified vitamin D deficiency    . Urinary tract infection, site not specified     Past Surgical History:  Procedure Laterality Date  . CARDIAC CATHETERIZATION     [facility]  . CATARACT EXTRACTION W/PHACO Right 03/08/2016   Procedure: CATARACT EXTRACTION PHACO AND INTRAOCULAR LENS PLACEMENT (IOC);  Surgeon: Sallee Lange, MD;  Location: [facility] ORS;  Service: Ophthalmology;  Laterality: Right;   FLUID CASSETTE [id number] H, EXP 07/19/17 [place]    3:16.8   AP%   27.2 CDE 94.14  . EXTERNAL EAR SURGERY Left   . LOBECTOMY Right 1952   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17    Social History   Tobacco Use  . Smoking status: Never Smoker  . Smokeless tobacco: Never Used  . Tobacco comment: smoking cessation materials not required  Substance Use Topics  . Alcohol use: No    Alcohol/week: 0.0 standard drinks     Current Outpatient Medications:  .  aspirin 81 MG tablet, Take 81 mg by mouth daily., Disp: , Rfl:  .  Cholecalciferol (VITAMIN D3) 5000 UNITS CAPS, Take 1 capsule by mouth daily. , Disp: , Rfl:  .  cloNIDine (CATAPRES) 0.1 MG tablet, Take 1 tablet (0.1 mg total) by mouth 2 (two) times daily. Do not take if top blood pressure is less than 145, Disp: 180 tablet, Rfl: 0 .  LORazepam (ATIVAN) 0.5 MG tablet, Take 1 tablet (0.5 mg total) by mouth  daily as needed for anxiety., Disp: 30 tablet, Rfl: 1 .  mirtazapine (REMERON) 7.5 MG tablet, Take 1 tablet (7.5 mg total) by mouth at bedtime. For mood and sleep, Disp: 30 tablet, Rfl: 0 .  nebivolol (BYSTOLIC) 10 MG tablet, TAKE 1 TABLET(10 MG) BY MOUTH DAILY FOR BLOOD PRESSURE, Disp: 90 tablet, Rfl: 0 .  potassium chloride (KLOR-CON 10) 10 MEQ tablet, Take 1 tablet (10 mEq total) by mouth daily., Disp: 30 tablet, Rfl: 0 .  simvastatin (ZOCOR) 20 MG tablet, Take 1 tablet (20 mg total) by mouth daily at 6 PM. For cholesterol, Disp: 90 tablet, Rfl: 1  Allergies  Allergen Reactions  . Sertraline   . Penicillins Rash    Has patient had a PCN reaction causing immediate rash,  facial/tongue/throat swelling, SOB or lightheadedness with hypotension: Yes Has patient had a PCN reaction causing severe rash involving mucus membranes or skin necrosis: No Has patient had a PCN reaction that required hospitalization: No Has patient had a PCN reaction occurring within the last 10 years: No If all of the above answers are "NO", then may proceed with Cephalosporin use.  . Sulfa Antibiotics Other (See Comments)    Pt does not remember but thinks that it just didn't agree with her    ROS    No other specific complaints in a complete review of systems (except as listed in HPI above).  Objective  Vitals:   02/22/18 1041  BP: 140/60  Pulse: 72  Resp: 16  Temp: (!) 97.5 F (36.4 C)  TempSrc: Oral  SpO2: 98%  Weight: 96 lb 9.6 oz (43.8 kg)  Height: 5\' 1"  (1.549 m)     Body mass index is 18.25 kg/m.  Nursing Note and Vital Signs reviewed.  Physical Exam  Constitutional: She is oriented to person, place, and time. She appears well-developed and well-nourished.  HENT:  Head: Normocephalic and atraumatic.  Eyes: Pupils are equal, round, and reactive to light. Conjunctivae and EOM are normal.  Neck: Normal range of motion. Neck supple.  Cardiovascular: Normal rate, normal heart sounds and intact distal pulses.  No carotid bruits 2+ pitting edema in bilateral feet extending to mid calf, no redness or warmth noted  Pulmonary/Chest: Effort normal and breath sounds normal.  Abdominal: Soft. Bowel sounds are normal. There is no tenderness.  Musculoskeletal: Normal range of motion.  Lymphadenopathy:    She has no cervical adenopathy.  Neurological: She is alert and oriented to person, place, and time.  Skin: Skin is warm and dry. No erythema.  Psychiatric: She has a normal mood and affect. Her behavior is normal. Judgment and thought content normal.     Results for orders placed or performed in visit on 02/22/18 (from the past 48 hour(s))  POCT Urinalysis  Dipstick     Status: Normal   Collection Time: 02/22/18 10:44 AM  Result Value Ref Range   Color, UA Yellow    Clarity, UA Clear    Glucose, UA Negative Negative   Bilirubin, UA Negative    Ketones, UA Negative    Spec Grav, UA 1.010 1.010 - 1.025   Blood, UA Negative    pH, UA 6.5 5.0 - 8.0   Protein, UA Negative Negative   Urobilinogen, UA 0.2 0.2 or 1.0 E.U./dL   Nitrite, UA Negative    Leukocytes, UA Negative Negative   Appearance Normal    Odor Normal     Assessment & Plan  1. Urinary frequency Negative, discussed bladder training and  Kegel  - POCT Urinalysis Dipstick  2. Cognitive and behavioral changes No significant changes but daughter concerned of UTI, re-assurance provided, seen psychiatry, will continue to monitor and notify with unimproved of further changes  - POCT Urinalysis Dipstick  3. Bilateral lower extremity edema Compression stockings, elevate when resting, stop amlodipine.  - BASIC METABOLIC PANEL WITH GFR  4. Essential hypertension Come back in one week for BP recheck; discussed nephrology referral made by PCP with patient and daughter and number given for scheduling. Would like to see if BP is still good in one week prior to reaching out as at home it has been at goal the past few weeks.  - nebivolol (BYSTOLIC) 10 MG tablet; TAKE 1 TABLET(10 MG) BY MOUTH DAILY FOR BLOOD PRESSURE  Dispense: 90 tablet; Refill: 0 - cloNIDine (CATAPRES) 0.1 MG tablet; Take 1 tablet (0.1 mg total) by mouth 2 (two) times daily. Do not take if top blood pressure is less than 145  Dispense: 180 tablet; Refill: 0

## 2018-03-01 ENCOUNTER — Other Ambulatory Visit: Payer: Self-pay | Admitting: Psychiatry

## 2018-03-01 ENCOUNTER — Ambulatory Visit: Payer: Self-pay

## 2018-03-01 DIAGNOSIS — F411 Generalized anxiety disorder: Secondary | ICD-10-CM

## 2018-03-07 ENCOUNTER — Ambulatory Visit: Payer: Medicare Other | Admitting: Psychiatry

## 2018-03-11 ENCOUNTER — Telehealth: Payer: Self-pay | Admitting: Family Medicine

## 2018-03-11 DIAGNOSIS — R6889 Other general symptoms and signs: Secondary | ICD-10-CM

## 2018-03-11 HISTORY — DX: Other general symptoms and signs: R68.89

## 2018-03-11 NOTE — Telephone Encounter (Signed)
Pt and daughter notified. Appt made

## 2018-03-11 NOTE — Assessment & Plan Note (Signed)
Refer to vascular 

## 2018-03-11 NOTE — Telephone Encounter (Signed)
I've put in a referral for her to see a vascular specialist Please let her know that the screening done by the nurse practitioner showed that she has decreased circulation in her legs

## 2018-03-11 NOTE — Telephone Encounter (Signed)
Copied from CRM [id number]. Topic: Quick Communication - See Telephone Encounter >> Mar 11, 2018 10:39 AM Gean Birchwood R wrote: Newman Nip calling from [facility] Nurse Practioner , she is calling to report her PAD Screening showed right foot 0.45 significant and left foot 0.46 significant. Rounds are made 1 time a year to members.  CB# [phone]

## 2018-03-13 ENCOUNTER — Telehealth: Payer: Self-pay

## 2018-03-13 NOTE — Telephone Encounter (Signed)
Copied from CRM [id number]. Topic: General - Other >> Mar 13, 2018  9:59 AM Jay Schlichter wrote: Reason for CRM: [facility] is asking ig this referral is for a consult or for abi only?

## 2018-03-13 NOTE — Telephone Encounter (Signed)
Consultation

## 2018-03-21 ENCOUNTER — Encounter: Payer: Self-pay | Admitting: Psychiatry

## 2018-03-21 ENCOUNTER — Ambulatory Visit: Payer: Medicare Other | Admitting: Psychiatry

## 2018-03-21 ENCOUNTER — Ambulatory Visit: Payer: Self-pay | Admitting: Family Medicine

## 2018-03-21 ENCOUNTER — Other Ambulatory Visit: Payer: Self-pay

## 2018-03-21 VITALS — BP 176/67 | HR 61 | Temp 97.5°F | Wt 97.4 lb

## 2018-03-21 DIAGNOSIS — F411 Generalized anxiety disorder: Secondary | ICD-10-CM | POA: Diagnosis not present

## 2018-03-21 DIAGNOSIS — F320 Major depressive disorder, single episode, mild: Secondary | ICD-10-CM

## 2018-03-21 NOTE — Progress Notes (Signed)
BH MD OP Progress Note  03/21/2018 11:58 AM Tamara Sparks  MRN:  [id number]  Chief Complaint: ' I am here for follow up.' Chief Complaint    Follow-up; Medication Refill     HPI: Tamara Sparks is an 84 yr old female, widowed, lives in [place], has a history of anxiety, depression, hypertension, history of stroke, hearing loss, hyperlipidemia, presented to the clinic today for a follow-up visit.  Patient today presented along with her daughter-Tamara Sparks who provided collateral information.  Patient was started on mirtazapine last visit.  She did not like the effect of Zoloft and was noncompliant with it.  Patient today reports she stopped taking the mirtazapine also.  She reports she does not want to take any new medications at this time.  She reports she took mirtazapine one night and she did not like the way it made her feel the next morning.  She did not take it again.  She reports she has been sleeping okay and does not want any medications to help her sleep.  Patient continues to be anxious and sad on and off.  She however reports its more situational.  Being alone is one trigger for her.  Patient reports she feels nobody wants her anymore which is a stressor for her.  Patient's daughter who is here with her provided collateral information.  Per patient's daughter her children have been trying to provide as much support as possible.  The daughter reports she checks on her mom on a daily basis since her work is 10 minutes away.  Per daughter they have been trying to get her out of the house and also possibly take her to an adult day care ,however she does not want to do any of that.  Patient's blood pressure continues to be elevated. She will continue to work with her primary medical doctor regarding the same.  Discussed with patient to continue the Ativan as prescribed for now since she also takes it for her elevated blood pressure.  Discussed with patient as well as daughter community  and social resources available including services like [facility], pace program, hospice, daycare, as well as Me[employer] on [employer].  Social worker will work on referring patient for [employer] on Wheels program today.  Visit Diagnosis:    ICD-10-CM   1. Generalized anxiety disorder F41.1   2. MDD (major depressive disorder), single episode, mild (HCC) F32.0     Past Psychiatric History: I have reviewed past psychiatric history from my progress note on 10/17/2017.  Past trials of Zoloft, Ativan.  Past Medical History:  Past Medical History:  Diagnosis Date  . Anxiety state, unspecified   . Cardiac dysrhythmia, unspecified   . Depression   . Dyslipidemia, goal LDL below 130   . Dyspnea    DOE, patient does alot of work for living alone  . Dysrhythmia    patient unsure of this  . Heart murmur   . History of stroke    Right cerebellar intracerebral hemorrhage  . HOH (hard of hearing)   . Hyposmolality and/or hyponatremia   . Stroke [facility]) 2012   right hand shakes and can't write well  . Tremors of nervous system    RIGHT ARM  . Tuberculosis 1947   RIGHT UPPER LOBE EXCISION AGE 73  . Unspecified essential hypertension   . Unspecified vitamin D deficiency   . Urinary tract infection, site not specified     Past Surgical History:  Procedure Laterality Date  .  CARDIAC CATHETERIZATION     [facility]  . CATARACT EXTRACTION W/PHACO Right 03/08/2016   Procedure: CATARACT EXTRACTION PHACO AND INTRAOCULAR LENS PLACEMENT (IOC);  Surgeon: Sallee Lange, MD;  Location: [facility] ORS;  Service: Ophthalmology;  Laterality: Right;   FLUID CASSETTE [id number] H, EXP 07/19/17 [place]    3:16.8   AP%   27.2 CDE 94.14  . EXTERNAL EAR SURGERY Left   . LOBECTOMY Right 1952   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54    Family Psychiatric History: I have reviewed family psychiatric history from my progress note on 10/17/2017.  Family History:  Family History  Problem Relation Age of Onset  . Stroke Mother    . Hypertension Father   . Stroke Father   . Hypertension Sister   . Hypertension Brother   . Cancer Brother        brain  . Hypertension Sister   . Hypertension Brother   . Cancer Brother        stomach    Social History: Reviewed social history from my progress note on 10/17/2017. Social History   Socioeconomic History  . Marital status: Widowed    Spouse name: Tamara Sparks  . Number of children: 4  . Years of education: Not on file  . Highest education level: 9th grade  Occupational History  . Occupation: Retired  Engineer, production  . Financial resource strain: Not hard at all  . Food insecurity:    Worry: Never true    Inability: Never true  . Transportation needs:    Medical: No    Non-medical: No  Tobacco Use  . Smoking status: Never Smoker  . Smokeless tobacco: Never Used  . Tobacco comment: smoking cessation materials not required  Substance and Sexual Activity  . Alcohol use: No    Alcohol/week: 0.0 standard drinks  . Drug use: No  . Sexual activity: Not Currently  Lifestyle  . Physical activity:    Days per week: 0 days    Minutes per session: 0 min  . Stress: Rather much  Relationships  . Social connections:    Talks on phone: Patient refused    Gets together: Patient refused    Attends religious service: Patient refused    Active member of club or organization: Patient refused    Attends meetings of clubs or organizations: Patient refused    Relationship status: Widowed  Other Topics Concern  . Not on file  Social History Narrative  . Not on file    Allergies:  Allergies  Allergen Reactions  . Sertraline   . Penicillins Rash    Has patient had a PCN reaction causing immediate rash, facial/tongue/throat swelling, SOB or lightheadedness with hypotension: Yes Has patient had a PCN reaction causing severe rash involving mucus membranes or skin necrosis: No Has patient had a PCN reaction that required hospitalization: No Has patient had a PCN reaction  occurring within the last 10 years: No If all of the above answers are "NO", then may proceed with Cephalosporin use.  . Sulfa Antibiotics Other (See Comments)    Pt does not remember but thinks that it just didn't agree with her    Metabolic Disorder Labs: Lab Results  Component Value Date   HGBA1C 5.5 09/23/2015   No results found for: PROLACTIN Lab Results  Component Value Date   CHOL 191 09/20/2017   TRIG 105 09/20/2017   HDL 55 09/20/2017   CHOLHDL 3.5 09/20/2017   VLDL 17 07/10/2016   LDLCALC 115 (H)  09/20/2017   LDLCALC 82 07/10/2016   Lab Results  Component Value Date   TSH 1.76 01/03/2018    Therapeutic Level Labs: No results found for: LITHIUM No results found for: VALPROATE No components found for:  CBMZ  Current Medications: Current Outpatient Medications  Medication Sig Dispense Refill  . aspirin 81 MG tablet Take 81 mg by mouth daily.    . Cholecalciferol (VITAMIN D3) 5000 UNITS CAPS Take 1 capsule by mouth daily.     . cloNIDine (CATAPRES) 0.1 MG tablet Take 1 tablet (0.1 mg total) by mouth 2 (two) times daily. Do not take if top blood pressure is less than 145 180 tablet 0  . LORazepam (ATIVAN) 0.5 MG tablet Take 1 tablet (0.5 mg total) by mouth daily as needed for anxiety. 30 tablet 1  . nebivolol (BYSTOLIC) 10 MG tablet TAKE 1 IONGEX(52 MG) BY MOUTH DAILY FOR BLOOD PRESSURE 90 tablet 0  . potassium chloride (KLOR-CON 10) 10 MEQ tablet Take 1 tablet (10 mEq total) by mouth daily. 30 tablet 0  . simvastatin (ZOCOR) 20 MG tablet Take 1 tablet (20 mg total) by mouth daily at 6 PM. For cholesterol 90 tablet 1   No current facility-administered medications for this visit.      Musculoskeletal: Strength & Muscle Tone: within normal limits Gait & Station: normal Patient leans: N/A  Psychiatric Specialty Exam: Review of Systems  Psychiatric/Behavioral: The patient is nervous/anxious.   All other systems reviewed and are negative.   Blood pressure (!)  176/67, pulse 61, temperature (!) 97.5 F (36.4 C), temperature source Oral, weight 97 lb 6.4 oz (44.2 kg).Body mass index is 18.4 kg/m.  General Appearance: Casual  Eye Contact:  Fair  Speech:  Clear and Coherent  Volume:  Normal  Mood:  Anxious  Affect:  Congruent  Thought Process:  Goal Directed and Descriptions of Associations: Intact  Orientation:  Full (Time, Place, and Person)  Thought Content: Logical   Suicidal Thoughts:  No  Homicidal Thoughts:  No  Memory:  Immediate;   Fair Recent;   Fair Remote;   Fair  Judgement:  Fair  Insight:  Fair  Psychomotor Activity:  Normal  Concentration:  Concentration: Fair and Attention Span: Fair  Recall:  [employer] of Knowledge: Fair  Language: Fair  Akathisia:  No  Handed:  Right  AIMS (if indicated): na  Assets:  Communication Skills Desire for Improvement Social Support  ADL's:  Intact  Cognition: WNL  Sleep:  Fair   Screenings: PHQ2-9     Office Visit from 01/01/2018 in [facility] Clinical Support from 09/04/2017 in [facility] Office Visit from 06/19/2017 in [facility] Office Visit from 02/21/2017 in [facility] Office Visit from 12/18/2016 in [facility]  PHQ-2 Total Score  3  2  0  3  0  PHQ-9 Total Score  6  4  -  3  -       Assessment and Plan: Mila is an 84 yr old Caucasian female, widowed, has a history of depression, anxiety, multiple medical problems including hypertension, history of stroke, hyperlipidemia, bilateral hearing loss, presented to the clinic today for a follow-up visit.  Patient also was recently diagnosed with hyponatremia and hypokalemia secondary to her previous antihypertensive medication.  Patient continues to be anxious and depressed more so because of her social situation her physical limitations, aging, being alone and so on.  Patient reports she does  not like to try any new medications.  She has  not been compliant with the mirtazapine which was prescribed last visit.  Patient also stopped taking the Zoloft which was prescribed prior to that one.  Discussed with patient as well as family that we will continue the lorazepam right now.    Patient will continue to follow-up with Dr. Sherie Don her primary medical doctor for her other medical problems as well as elevated blood pressure.  Plan GAD Lorazepam 0.5 mg daily PRN for anxiety.  She also takes it for her elevated blood pressure. Discontinue mirtazapine for noncompliance.  For depressive symptoms Will not restart any medications at this time.  Will try to connect her with social resources.  Provided education to her daughter who will also try to support her and try to reach community resources.  We will try to get her social resources like Meals on Wheels and other support.  Provided information to the daughter who will try to reach out to community resources.  Our social worker here will refer patient for the Meals on Wheels program today. Follow-up in clinic in 2-3 months or sooner if needed.  Elevated BP She will continue to follow up with PMD.  More than 50 % of the time was spent for psychoeducation and supportive psychotherapy and care coordination.  This note was generated in part or whole with voice recognition software. Voice recognition is usually quite accurate but there are transcription errors that can and very often do occur. I apologize for any typographical errors that were not detected and corrected.       Jomarie Longs, MD 03/22/2018, 9:58 AM

## 2018-03-22 ENCOUNTER — Encounter: Payer: Self-pay | Admitting: Psychiatry

## 2018-04-02 ENCOUNTER — Ambulatory Visit (INDEPENDENT_AMBULATORY_CARE_PROVIDER_SITE_OTHER): Payer: Medicare Other | Admitting: Family Medicine

## 2018-04-02 ENCOUNTER — Encounter: Payer: Self-pay | Admitting: Family Medicine

## 2018-04-02 DIAGNOSIS — F411 Generalized anxiety disorder: Secondary | ICD-10-CM | POA: Diagnosis not present

## 2018-04-02 DIAGNOSIS — I10 Essential (primary) hypertension: Secondary | ICD-10-CM | POA: Diagnosis not present

## 2018-04-02 MED ORDER — METOPROLOL SUCCINATE ER 50 MG PO TB24: 50.0000 mg | ORAL_TABLET | Freq: Every day | ORAL | 3 refills | Status: DC

## 2018-04-02 NOTE — Patient Instructions (Addendum)
Try to follow the DASH guidelines (DASH stands for Dietary Approaches to Stop Hypertension). Try to limit the sodium in your diet to no more than 1,500mg  of sodium per day. Certainly try to not exceed 2,000 mg per day at the very most. Do not add salt when cooking or at the table.  Check the sodium amount on labels when shopping, and choose items lower in sodium when given a choice. Avoid or limit foods that already contain a lot of sodium. Eat a diet rich in fruits and vegetables and whole grains, and try to lose weight if overweight or obese  Monitor your blood pressure and your heart rate daily for the next two weeks and let me know if trending up or down   DASH Eating Plan DASH stands for "Dietary Approaches to Stop Hypertension." The DASH eating plan is a healthy eating plan that has been shown to reduce high blood pressure (hypertension). It may also reduce your risk for type 2 diabetes, heart disease, and stroke. The DASH eating plan may also help with weight loss. What are tips for following this plan? General guidelines  Avoid eating more than 2,300 mg (milligrams) of salt (sodium) a day. If you have hypertension, you may need to reduce your sodium intake to 1,500 mg a day.  Limit alcohol intake to no more than 1 drink a day for nonpregnant women and 2 drinks a day for men. One drink equals 12 oz of beer, 5 oz of wine, or 1 oz of hard liquor.  Work with your health care provider to maintain a healthy body weight or to lose weight. Ask what an ideal weight is for you.  Get at least 30 minutes of exercise that causes your heart to beat faster (aerobic exercise) most days of the week. Activities may include walking, swimming, or biking.  Work with your health care provider or diet and nutrition specialist (dietitian) to adjust your eating plan to your individual calorie needs. Reading food labels  Check food labels for the amount of sodium per serving. Choose foods with less than 5  percent of the Daily Value of sodium. Generally, foods with less than 300 mg of sodium per serving fit into this eating plan.  To find whole grains, look for the word "whole" as the first word in the ingredient list. Shopping  Buy products labeled as "low-sodium" or "no salt added."  Buy fresh foods. Avoid canned foods and premade or frozen meals. Cooking  Avoid adding salt when cooking. Use salt-free seasonings or herbs instead of table salt or sea salt. Check with your health care provider or pharmacist before using salt substitutes.  Do not fry foods. Cook foods using healthy methods such as baking, boiling, grilling, and broiling instead.  Cook with heart-healthy oils, such as olive, canola, soybean, or sunflower oil. Meal planning   Eat a balanced diet that includes: ? 5 or more servings of fruits and vegetables each day. At each meal, try to fill half of your plate with fruits and vegetables. ? Up to 6-8 servings of whole grains each day. ? Less than 6 oz of lean meat, poultry, or fish each day. A 3-oz serving of meat is about the same size as a deck of cards. One egg equals 1 oz. ? 2 servings of low-fat dairy each day. ? A serving of nuts, seeds, or beans 5 times each week. ? Heart-healthy fats. Healthy fats called Omega-3 fatty acids are found in foods such as flaxseeds  and coldwater fish, like sardines, salmon, and mackerel.  Limit how much you eat of the following: ? Canned or prepackaged foods. ? Food that is high in trans fat, such as fried foods. ? Food that is high in saturated fat, such as fatty meat. ? Sweets, desserts, sugary drinks, and other foods with added sugar. ? Full-fat dairy products.  Do not salt foods before eating.  Try to eat at least 2 vegetarian meals each week.  Eat more home-cooked food and less restaurant, buffet, and fast food.  When eating at a restaurant, ask that your food be prepared with less salt or no salt, if possible. What foods are  recommended? The items listed may not be a complete list. Talk with your dietitian about what dietary choices are best for you. Grains Whole-grain or whole-wheat bread. Whole-grain or whole-wheat pasta. Brown rice. [place]. Bulgur. Whole-grain and low-sodium cereals. Pita bread. Low-fat, low-sodium crackers. Whole-wheat flour tortillas. Vegetables Fresh or frozen vegetables (raw, steamed, roasted, or grilled). Low-sodium or reduced-sodium tomato and vegetable juice. Low-sodium or reduced-sodium tomato sauce and tomato paste. Low-sodium or reduced-sodium canned vegetables. Fruits All fresh, dried, or frozen fruit. Canned fruit in natural juice (without added sugar). Meat and other protein foods Skinless chicken or [place]. Ground chicken or [place]. Pork with fat trimmed off. Fish and seafood. Egg whites. Dried beans, peas, or lentils. Unsalted nuts, nut butters, and seeds. Unsalted canned beans. Lean cuts of beef with fat trimmed off. Low-sodium, lean deli meat. Dairy Low-fat (1%) or fat-free (skim) milk. Fat-free, low-fat, or reduced-fat cheeses. Nonfat, low-sodium ricotta or cottage cheese. Low-fat or nonfat yogurt. Low-fat, low-sodium cheese. Fats and oils Soft margarine without trans fats. Vegetable oil. Low-fat, reduced-fat, or light mayonnaise and salad dressings (reduced-sodium). Canola, safflower, olive, soybean, and sunflower oils. Avocado. Seasoning and other foods Herbs. Spices. Seasoning mixes without salt. Unsalted popcorn and pretzels. Fat-free sweets. What foods are not recommended? The items listed may not be a complete list. Talk with your dietitian about what dietary choices are best for you. Grains Baked goods made with fat, such as croissants, muffins, or some breads. Dry pasta or rice meal packs. Vegetables Creamed or fried vegetables. Vegetables in a cheese sauce. Regular canned vegetables (not low-sodium or reduced-sodium). Regular canned tomato sauce and paste (not  low-sodium or reduced-sodium). Regular tomato and vegetable juice (not low-sodium or reduced-sodium). Angie Fava. Olives. Fruits Canned fruit in a light or heavy syrup. Fried fruit. Fruit in cream or butter sauce. Meat and other protein foods Fatty cuts of meat. Ribs. Fried meat. Berniece Salines. Sausage. Bologna and other processed lunch meats. Salami. Fatback. Hotdogs. Bratwurst. Salted nuts and seeds. Canned beans with added salt. Canned or smoked fish. Whole eggs or egg yolks. Chicken or [place] with skin. Dairy Whole or 2% milk, cream, and half-and-half. Whole or full-fat cream cheese. Whole-fat or sweetened yogurt. Full-fat cheese. Nondairy creamers. Whipped toppings. Processed cheese and cheese spreads. Fats and oils Butter. Stick margarine. Lard. Shortening. Ghee. Bacon fat. Tropical oils, such as coconut, palm kernel, or palm oil. Seasoning and other foods Salted popcorn and pretzels. Onion salt, garlic salt, seasoned salt, table salt, and sea salt. Worcestershire sauce. Tartar sauce. Barbecue sauce. Teriyaki sauce. Soy sauce, including reduced-sodium. Steak sauce. Canned and packaged gravies. Fish sauce. Oyster sauce. Cocktail sauce. Horseradish that you find on the shelf. Ketchup. Mustard. Meat flavorings and tenderizers. Bouillon cubes. Hot sauce and Tabasco sauce. Premade or packaged marinades. Premade or packaged taco seasonings. Relishes. Regular salad dressings. Where to  find more information:  National Heart, Lung, and Blood Institute: PopSteam.is  American Heart Association: www.heart.org Summary  The DASH eating plan is a healthy eating plan that has been shown to reduce high blood pressure (hypertension). It may also reduce your risk for type 2 diabetes, heart disease, and stroke.  With the DASH eating plan, you should limit salt (sodium) intake to 2,300 mg a day. If you have hypertension, you may need to reduce your sodium intake to 1,500 mg a day.  When on the DASH eating plan,  aim to eat more fresh fruits and vegetables, whole grains, lean proteins, low-fat dairy, and heart-healthy fats.  Work with your health care provider or diet and nutrition specialist (dietitian) to adjust your eating plan to your individual calorie needs. This information is not intended to replace advice given to you by your health care provider. Make sure you discuss any questions you have with your health care provider. Document Released: 04/27/2011 Document Revised: 05/01/2016 Document Reviewed: 05/01/2016 Elsevier Interactive Patient Education  [employer].

## 2018-04-02 NOTE — Progress Notes (Signed)
BP 138/70   Pulse 62   Temp 97.9 F (36.6 C)   Ht 5' (1.524 m)   Wt 101 lb 9.6 oz (46.1 kg)   SpO2 96%   BMI 19.84 kg/m    Subjective:    Patient ID: Tamara Sparks, female    DOB: Sep 20, 1928, 84 y.o.   MRN: [id number]  HPI: Tamara Sparks is a 84 y.o. female  Chief Complaint  Patient presents with  . Follow-up    HPI   She is checking her BP at home with the wrist cuff She can tell when her pressure goes up; mostly in her head Some days it is 170 something on top, 180 sometimes Only using clonidine if needed She says she does not want to go see a vascular doctor or kidney doctor Not adding salt to her food Eating frozen veggies instead of canned veggies She is seeing a psychiatrist now; psych said low Na+, but back to normal last time Drinking ensure now and hair is better  She has been on Bystolic Did not tolerate amlodipine  Does not drink water to excess  Head CT August 2019: IMPRESSION: 1. No acute abnormality. 2. Mild diffuse cerebral and cerebellar atrophy and mild chronic small vessel white matter ischemic changes in both cerebral hemispheres.   Electronically Signed   By: Beckie SaltsSteven  Reid M.D.   On: 12/29/2017 13:24  She is seeing Dr. Elna BreslowEappen; psychiatrist  [facility] 04/02/2018 01/01/2018 09/04/2017 06/19/2017 02/21/2017  Decreased Interest 3 0 1 0 1  Down, Depressed, Hopeless 3 3 1  0 2  PHQ - 2 Score 6 3 2  0 3  Altered sleeping 0 0 0 - 0  Tired, decreased energy 0 1 1 - 0  Change in appetite 0 1 1 - 0  Feeling bad or failure about yourself  0 0 0 - 0  Trouble concentrating 0 0 0 - 0  Moving slowly or fidgety/restless 0 1 0 - 0  Suicidal thoughts 0 0 0 - 0  PHQ-9 Score 6 6 4  - 3  Difficult doing work/chores Not difficult at all Not difficult at all Not difficult at all - Not difficult at all   Fall Risk  04/02/2018 02/22/2018 02/22/2018 01/01/2018 09/04/2017  Falls in the past year? 1 No No Yes Yes  Comment - - - - tripped  over chair  Number falls in past yr: 0 - - 1 2 or more  Injury with Fall? 0 - - No No  Risk Factor Category  - - - - High Fall Risk  Risk for fall due to : - - - - Medication side effect;Impaired vision;History of fall(s);Other (Comment)  Risk for fall due to: Comment - - - - Ativan and wears eyeglasses. Heraing loss L ear  Follow up - - - - Falls evaluation completed;Education provided;Falls prevention discussed    Relevant past medical, surgical, family and social history reviewed Past Medical History:  Diagnosis Date  . Anxiety state, unspecified   . Cardiac dysrhythmia, unspecified   . Depression   . Dyslipidemia, goal LDL below 130   . Dyspnea    DOE, patient does alot of work for living alone  . Dysrhythmia    patient unsure of this  . Heart murmur   . History of stroke    Right cerebellar intracerebral hemorrhage  . HOH (hard of hearing)   . Hyposmolality and/or hyponatremia   . Stroke [facility] 2012   right hand shakes  and can't write well  . Tremors of nervous system    RIGHT ARM  . Tuberculosis 1947   RIGHT UPPER LOBE EXCISION AGE 71  . Unspecified essential hypertension   . Unspecified vitamin D deficiency   . Urinary tract infection, site not specified    Past Surgical History:  Procedure Laterality Date  . CARDIAC CATHETERIZATION     [facility]  . CATARACT EXTRACTION W/PHACO Right 03/08/2016   Procedure: CATARACT EXTRACTION PHACO AND INTRAOCULAR LENS PLACEMENT (IOC);  Surgeon: Sallee Lange, MD;  Location: [facility] ORS;  Service: Ophthalmology;  Laterality: Right;   FLUID CASSETTE [id number] H, EXP 07/19/17 [place]    3:16.8   AP%   27.2 CDE 94.14  . EXTERNAL EAR SURGERY Left   . LOBECTOMY Right 1952   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17   Family History  Problem Relation Age of Onset  . Stroke Mother   . Hypertension Father   . Stroke Father   . Hypertension Sister   . Hypertension Brother   . Cancer Brother        brain  . Hypertension Sister   . Hypertension Brother    . Cancer Brother        stomach   Social History   Tobacco Use  . Smoking status: Never Smoker  . Smokeless tobacco: Never Used  . Tobacco comment: smoking cessation materials not required  Substance Use Topics  . Alcohol use: No    Alcohol/week: 0.0 standard drinks  . Drug use: No     Office Visit from 04/02/2018 in [facility]  AUDIT-C Score  0      Interim medical history since last visit reviewed. Allergies and medications reviewed  Review of Systems Per HPI unless specifically indicated above     Objective:    BP 138/70   Pulse 62   Temp 97.9 F (36.6 C)   Ht 5' (1.524 m)   Wt 101 lb 9.6 oz (46.1 kg)   SpO2 96%   BMI 19.84 kg/m   Wt Readings from Last 3 Encounters:  04/02/18 101 lb 9.6 oz (46.1 kg)  02/22/18 96 lb 9.6 oz (43.8 kg)  01/03/18 93 lb 14.4 oz (42.6 kg)    Physical Exam  Constitutional: She appears well-developed and well-nourished.  HENT:  Mouth/Throat: Mucous membranes are normal.  Eyes: EOM are normal. No scleral icterus.  Cardiovascular: Normal rate and regular rhythm.  Pulmonary/Chest: Effort normal and breath sounds normal.  Psychiatric: She has a normal mood and affect. Her behavior is normal.    Results for orders placed or performed in visit on 02/22/18  BASIC METABOLIC PANEL WITH GFR  Result Value Ref Range   Glucose, Bld 96 65 - 139 mg/dL   BUN 12 7 - 25 mg/dL   Creat 1.47 8.29 - 5.62 mg/dL   GFR, Est Non African American 65 > OR = 60 mL/min/1.41m2   GFR, Est African American 76 > OR = 60 mL/min/1.70m2   BUN/Creatinine Ratio NOT APPLICABLE 6 - 22 (calc)   Sodium 138 135 - 146 mmol/L   Potassium 4.2 3.5 - 5.3 mmol/L   Chloride 100 98 - 110 mmol/L   CO2 31 20 - 32 mmol/L   Calcium 10.2 8.6 - 10.4 mg/dL  POCT Urinalysis Dipstick  Result Value Ref Range   Color, UA Yellow    Clarity, UA Clear    Glucose, UA Negative Negative   Bilirubin, UA Negative    Ketones, UA Negative  Spec Grav, UA 1.010  1.010 - 1.025   Blood, UA Negative    pH, UA 6.5 5.0 - 8.0   Protein, UA Negative Negative   Urobilinogen, UA 0.2 0.2 or 1.0 E.U./dL   Nitrite, UA Negative    Leukocytes, UA Negative Negative   Appearance Normal    Odor Normal       Assessment & Plan:   Problem List Items Addressed This Visit      Cardiovascular and Mediastinum   Essential hypertension (Chronic)    See AVS; DASH guidelines encouraged; work on controlling anxiety, seeing psychiatrist; monitor BP and pulse; change medicine; f/u in 3 months, sooner if BP and pulse not at target      Relevant Medications   metoprolol succinate (TOPROL-XL) 50 MG 24 hr tablet       Follow up plan: Return in about 3 months (around 07/03/2018) for follow-up visit with Dr. Sherie Don.  An after-visit summary was printed and given to the patient at check-out.  Please see the patient instructions which may contain other information and recommendations beyond what is mentioned above in the assessment and plan.  Meds ordered this encounter  Medications  . metoprolol succinate (TOPROL-XL) 50 MG 24 hr tablet    Sig: Take 1 tablet (50 mg total) by mouth daily. Take with or immediately following a meal.    Dispense:  90 tablet    Refill:  3    Please DISCONTINUE the Bystolic    No orders of the defined types were placed in this encounter.

## 2018-04-09 ENCOUNTER — Telehealth: Payer: Self-pay | Admitting: Family Medicine

## 2018-04-09 NOTE — Telephone Encounter (Signed)
Copied from CRM [id number]. Topic: General - Other >> Apr 09, 2018  1:01 PM Stephannie LiSimmons, Lei Dower L, [place] wrote: Reason for CRM: Patient daughter called to let practice know the patients blood pressure was 213/94 on 04/08/18 and 212/90 04/09/18 , she is currently not with the patient . Bonita QuinLinda called the patient to ask her if she would speak a triage nurse concerning her bp , she stated she would not answer her phone ,come in to be seen or open her door for EMS should they arrive ,This was all relayed once Bonita QuinLinda called and spoke with Demi another agent after asking  her to call her mom so NT could call and speak with the patient personnally.

## 2018-04-09 NOTE — Telephone Encounter (Signed)
I know that the patient has refused referral to hypertension specialist (nephrologist) She has PRN clonidine to use Her anxiety likely plays a significant part, and she is seeing psychiatrist Urge her to avoid salt and have daughter check her foods and see what she might be eating that has salt; we want her to get no more than 1500 mg of sodium total per day If chest pain, s/s of stroke, etc., then we recommend ER eval

## 2018-04-09 NOTE — Telephone Encounter (Signed)
Pt daughter Bonita QuinLinda notified

## 2018-04-09 NOTE — Telephone Encounter (Signed)
Spoke with pt's daughter,Linda regarding reported increased BP readings. Bonita QuinLinda states she is currently at work and not with the pt but states that the pt told her that her BP reading was 212/94 today. Bonita QuinLinda states that the pt does not given a specific time as to when BP was taken and just states that is was not this morning. Pt's daughter asked if the pt would be willing to talk to a triage nurse regarding increased BP but pt's daughter states that the pt is refusing to speak with a triage nurse at this time. Pt's daughter reports that she did call the pt after speaking with the agent initially to encourage the pt to answer the phone to speak with a nurse but the pt is stating she will not answer the phone to speak with a triage nurse. Pt's daughter also states that the pt will not come in for an appt. Pt's daughter thinks that the pt did take her BP medications today and also mentioned that she had Clonidine prescription to take if BP is greater than 145 but she is unsure if the pt has taken this today. Pt's daughter encouraged to call EMS to evaluate pt, but pt's daughter states that the pt will not open the door for EMS. Bonita QuinLinda states that her sister, will be able to check on the pt but it will not be until after 3 pm today.

## 2018-04-16 ENCOUNTER — Telehealth: Payer: Self-pay | Admitting: Psychiatry

## 2018-04-16 ENCOUNTER — Telehealth: Payer: Self-pay

## 2018-04-16 DIAGNOSIS — F411 Generalized anxiety disorder: Secondary | ICD-10-CM

## 2018-04-16 MED ORDER — LORAZEPAM 0.5 MG PO TABS: 0.5000 mg | ORAL_TABLET | Freq: Every day | ORAL | 3 refills | Status: DC | PRN

## 2018-04-16 NOTE — Telephone Encounter (Signed)
pt daughter called left message that her mom needs enough medication to get to her next appt. pt does not have any more refills.

## 2018-04-16 NOTE — Telephone Encounter (Signed)
Sent Ativan to pharmacy with date specified

## 2018-04-20 NOTE — Assessment & Plan Note (Signed)
See AVS; DASH guidelines encouraged; work on controlling anxiety, seeing psychiatrist; monitor BP and pulse; change medicine; f/u in 3 months, sooner if BP and pulse not at target

## 2018-04-20 NOTE — Assessment & Plan Note (Signed)
Likely affecting her pressures; working with psychiatrist

## 2018-04-22 ENCOUNTER — Other Ambulatory Visit: Payer: Self-pay | Admitting: Psychiatry

## 2018-04-22 DIAGNOSIS — F411 Generalized anxiety disorder: Secondary | ICD-10-CM

## 2018-05-17 ENCOUNTER — Other Ambulatory Visit: Payer: Self-pay

## 2018-05-17 ENCOUNTER — Other Ambulatory Visit: Payer: Self-pay | Admitting: Nurse Practitioner

## 2018-05-17 DIAGNOSIS — E785 Hyperlipidemia, unspecified: Secondary | ICD-10-CM

## 2018-05-17 NOTE — Patient Outreach (Signed)
[facility] Care Management  05/17/2018  Festus HoltsLouise B Lennon Nov 02, 1928 [id number]   Medication Adherence call to Mrs. Lynden AngLouise Toscano patient did not answer patient is due on Simvastatin 20 mg. Mrs. Mckinley JewelHazelwood is showing past due under [employer].   Lillia AbedAna Ollison-Moran CPhT Pharmacy Technician [facility] Care Management Direct Dial [phone]  Fax [phone] [email or web address]

## 2018-05-17 NOTE — Telephone Encounter (Signed)
Lab Results  Component Value Date   ALT 8 01/03/2018   Lab Results  Component Value Date   CHOL 191 09/20/2017   HDL 55 09/20/2017   LDLCALC 115 (H) 09/20/2017   TRIG 105 09/20/2017   CHOLHDL 3.5 09/20/2017

## 2018-05-23 DIAGNOSIS — H01003 Unspecified blepharitis right eye, unspecified eyelid: Secondary | ICD-10-CM | POA: Diagnosis not present

## 2018-06-16 ENCOUNTER — Other Ambulatory Visit: Payer: Self-pay | Admitting: Nurse Practitioner

## 2018-06-16 DIAGNOSIS — I10 Essential (primary) hypertension: Secondary | ICD-10-CM

## 2018-06-20 ENCOUNTER — Other Ambulatory Visit: Payer: Self-pay

## 2018-06-20 ENCOUNTER — Ambulatory Visit: Payer: Medicare Other | Admitting: Psychiatry

## 2018-06-20 ENCOUNTER — Encounter: Payer: Self-pay | Admitting: Psychiatry

## 2018-06-20 VITALS — BP 217/69 | HR 78 | Temp 97.5°F | Wt 102.2 lb

## 2018-06-20 DIAGNOSIS — F320 Major depressive disorder, single episode, mild: Secondary | ICD-10-CM | POA: Diagnosis not present

## 2018-06-20 DIAGNOSIS — R030 Elevated blood-pressure reading, without diagnosis of hypertension: Secondary | ICD-10-CM | POA: Diagnosis not present

## 2018-06-20 DIAGNOSIS — F411 Generalized anxiety disorder: Secondary | ICD-10-CM

## 2018-06-20 NOTE — Progress Notes (Signed)
BH MD OP Progress Note  06/20/2018 1:55 PM Tamara Sparks  MRN:  [id number]  Chief Complaint: ' I am here for follow up.' Chief Complaint    Follow-up; Anxiety; Depression      HPI: Tamara Sparks is a 85 year old female, widowed, lives in [place], has a history of anxiety, depression, hypertension, history of stroke, hearing loss, hyperlipidemia, presented to clinic today for a follow-up visit.  Patient today presented along with her daughter-[name] who provided collateral information.  Patient today presented as tearful.  On evaluation patient reported she was worried about her elevated blood pressure.  Patient was noted as having elevated blood pressure today.  Patient reports she has not been compliant on her clonidine since it makes her drowsy and she does not want to sleep all day. During the assessment, patient took a clonidine since she was worried about her blood pressure.  Patient as well as daughter agrees to recheck her blood pressure in half an hour from now and reach out to her primary medical doctor with concerns.  Patient continues to report she does not want any antidepressant or antianxiety agent.  She does report anxiety symptoms due to being alone or due to her health problems.  She however reports she takes lorazepam as needed and that helps her with the same.  Patient reports she feels sad about being lonely since she lives by herself.  She does have children who checks in on her however they are not with her all the time.  Patient was prescribed antidepressants like Zoloft and also mirtazapine.  Patient stayed on the Zoloft for a few days and stopped taking it.  Patient later on was started on mirtazapine for mood as well as sleep however she stopped taking that one also reporting she does not want any new medications.  Some time was spent educating patient as well as daughter today.  Discussed with patient that if she does not want to be on medications for her  depression or anxiety and just wants to stay on her previous lorazepam which was initiated by one of her primary medical doctors previously, discussed with her that she could return to her PMD who could manage her medications from here onwards.  Patient was also offered psychotherapy previously however she declined that also.    Visit Diagnosis:    ICD-10-CM   1. Generalized anxiety disorder F41.1   2. MDD (major depressive disorder), single episode, mild (HCC) F32.0   3. Elevated blood pressure reading R03.0     Past Psychiatric History: Reviewed past psychiatric history from my progress note on 10/17/2017.  Past trials of Zoloft, Ativan.  Past Medical History:  Past Medical History:  Diagnosis Date  . Anxiety state, unspecified   . Cardiac dysrhythmia, unspecified   . Depression   . Dyslipidemia, goal LDL below 130   . Dyspnea    DOE, patient does alot of work for living alone  . Dysrhythmia    patient unsure of this  . Heart murmur   . History of stroke    Right cerebellar intracerebral hemorrhage  . HOH (hard of hearing)   . Hyposmolality and/or hyponatremia   . Stroke [facility]) 2012   right hand shakes and can't write well  . Tremors of nervous system    RIGHT ARM  . Tuberculosis 1947   RIGHT UPPER LOBE EXCISION AGE 68  . Unspecified essential hypertension   . Unspecified vitamin D deficiency   . Urinary tract infection, site not  specified     Past Surgical History:  Procedure Laterality Date  . CARDIAC CATHETERIZATION     [facility]  . CATARACT EXTRACTION W/PHACO Right 03/08/2016   Procedure: CATARACT EXTRACTION PHACO AND INTRAOCULAR LENS PLACEMENT (IOC);  Surgeon: Sallee Lange, MD;  Location: [facility] ORS;  Service: Ophthalmology;  Laterality: Right;   FLUID CASSETTE [id number] H, EXP 07/19/17 [place]    3:16.8   AP%   27.2 CDE 94.14  . EXTERNAL EAR SURGERY Left   . LOBECTOMY Right 1952   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42    Family Psychiatric History: Reviewed family  psychiatric history from my progress note on 10/17/2017  Family History:  Family History  Problem Relation Age of Onset  . Stroke Mother   . Hypertension Father   . Stroke Father   . Hypertension Sister   . Hypertension Brother   . Cancer Brother        brain  . Hypertension Sister   . Hypertension Brother   . Cancer Brother        stomach    Social History: Reviewed social history from my progress note on 10/17/2017. Social History   Socioeconomic History  . Marital status: Widowed    Spouse name: [name]  . Number of children: 4  . Years of education: Not on file  . Highest education level: 9th grade  Occupational History  . Occupation: Retired  Engineer, production  . Financial resource strain: Not hard at all  . Food insecurity:    Worry: Never true    Inability: Never true  . Transportation needs:    Medical: No    Non-medical: No  Tobacco Use  . Smoking status: Never Smoker  . Smokeless tobacco: Never Used  . Tobacco comment: smoking cessation materials not required  Substance and Sexual Activity  . Alcohol use: No    Alcohol/week: 0.0 standard drinks  . Drug use: No  . Sexual activity: Not Currently  Lifestyle  . Physical activity:    Days per week: 0 days    Minutes per session: 0 min  . Stress: Rather much  Relationships  . Social connections:    Talks on phone: Patient refused    Gets together: Patient refused    Attends religious service: Patient refused    Active member of club or organization: Patient refused    Attends meetings of clubs or organizations: Patient refused    Relationship status: Widowed  Other Topics Concern  . Not on file  Social History Narrative  . Not on file    Allergies:  Allergies  Allergen Reactions  . Sertraline   . Penicillins Rash    Has patient had a PCN reaction causing immediate rash, facial/tongue/throat swelling, SOB or lightheadedness with hypotension: Yes Has patient had a PCN reaction causing severe rash  involving mucus membranes or skin necrosis: No Has patient had a PCN reaction that required hospitalization: No Has patient had a PCN reaction occurring within the last 10 years: No If all of the above answers are "NO", then may proceed with Cephalosporin use.  . Sulfa Antibiotics Other (See Comments)    Pt does not remember but thinks that it just didn't agree with her    Metabolic Disorder Labs: Lab Results  Component Value Date   HGBA1C 5.5 09/23/2015   No results found for: PROLACTIN Lab Results  Component Value Date   CHOL 191 09/20/2017   TRIG 105 09/20/2017   HDL 55 09/20/2017   CHOLHDL  3.5 09/20/2017   VLDL 17 07/10/2016   LDLCALC 115 (H) 09/20/2017   LDLCALC 82 07/10/2016   Lab Results  Component Value Date   TSH 1.76 01/03/2018    Therapeutic Level Labs: No results found for: LITHIUM No results found for: VALPROATE No components found for:  CBMZ  Current Medications: Current Outpatient Medications  Medication Sig Dispense Refill  . aspirin 81 MG tablet Take 81 mg by mouth daily.    . Cholecalciferol (VITAMIN D3) 5000 UNITS CAPS Take 1 capsule by mouth daily.     . cloNIDine (CATAPRES) 0.1 MG tablet TAKE 1 TABLET BY MOUTH 2 (TWO) TIMES DAILY. DO NOT TAKE IF TOP BLOOD PRESSURE IS LESS THAN 145 180 tablet 0  . LORazepam (ATIVAN) 0.5 MG tablet Take 1 tablet (0.5 mg total) by mouth daily as needed for anxiety. 30 tablet 3  . metoprolol succinate (TOPROL-XL) 50 MG 24 hr tablet Take 1 tablet (50 mg total) by mouth daily. Take with or immediately following a meal. 90 tablet 3  . potassium chloride (KLOR-CON 10) 10 MEQ tablet Take 1 tablet (10 mEq total) by mouth daily. 30 tablet 0  . simvastatin (ZOCOR) 20 MG tablet TAKE 1 TABLET(20 MG) BY MOUTH DAILY AT 6 PM FOR CHOLESTEROL 90 tablet 1   No current facility-administered medications for this visit.      Musculoskeletal: Strength & Muscle Tone: within normal limits Gait & Station: normal Patient leans:  N/A  Psychiatric Specialty Exam: Review of Systems  Psychiatric/Behavioral: The patient is nervous/anxious (situational).   All other systems reviewed and are negative.   Blood pressure (!) 217/69, pulse 78, temperature (!) 97.5 F (36.4 C), temperature source Oral, weight 102 lb 3.2 oz (46.4 kg).Body mass index is 19.96 kg/m.  General Appearance: Casual  Eye Contact:  Fair  Speech:  Clear and Coherent  Volume:  Normal  Mood:  Anxious situational   Affect:  Appropriate  Thought Process:  Goal Directed and Descriptions of Associations: Intact  Orientation:  Other:  self, situation, place  Thought Content: Logical   Suicidal Thoughts:  No  Homicidal Thoughts:  No  Memory:  Immediate;   Fair Recent;   Fair Remote;   Fair  Judgement:  Fair  Insight:  Fair  Psychomotor Activity:  Normal  Concentration:  Concentration: Fair and Attention Span: Fair  Recall:  FiservFair  Fund of Knowledge: Fair  Language: Fair  Akathisia:  No  Handed:  Right  AIMS (if indicated):Denies tremors, rigidity,stiffness  Assets:  Communication Skills Desire for Improvement Social Support  ADL's:  Intact  Cognition: WNL  Sleep:  Fair   Screenings: PHQ2-9     [facility]  PHQ-2 Total Score  6  3  2   0  3  PHQ-9 Total Score  6  6  4   -  3       Assessment and Plan: Sallye OberLouise is an 85 year old Caucasian female, widowed, has a history of depression, anxiety, multiple medical problems including hypertension, history of stroke, hyperlipidemia, bilateral hearing loss, presented to clinic today for a follow-up visit.  Patient with recent diagnosis of hyponatremia, hypokalemia secondary to her medications, did not  tolerate the Zoloft or the mirtazapine.  Patient continues to be on Ativan.  Previous trials of taking her off the Ativan led to elevated uncontrollable hypertension.  Hence Ativan was restarted.  Patient continues to have anxiety symptoms more so because of her being lonely and having multiple health problems.  She reports the Ativan helps her to manage that.  She also has support system from her children.  Discussed plan as noted below.   Plan GAD-stable Continue lorazepam 0.5 mg daily as needed for anxiety as needed. She has been noncompliant with mirtazapine and it was discontinued.  For depressive symptoms -stable She declines psychotherapy referral. Patient declined antidepressant medications as summarized above.  Elevated blood pressure reading Patient took one clonidine while in the office and daughter agrees to recheck her blood pressure and reach out to her primary medical doctor.  Discussed with patient as well as daughter that patient can be transitioned back to her primary medical doctor since she is not interested in any new medications for her anxiety or depression and just wants be continued on her lorazepam which she has been taking for several years and which was initiated by her previous primary medical doctor.  I have spent atleast 15 minutes face to face with patient today. More than 50 % of the time was spent for psychoeducation and supportive psychotherapy and care coordination.  This note was generated in part or whole with voice recognition software. Voice recognition is usually quite accurate but there are transcription errors that can and very often do occur. I apologize for any typographical errors that were not detected and corrected.            Jomarie LongsSaramma Albertina Leise, MD 06/20/2018, 1:55 PM

## 2018-06-21 ENCOUNTER — Telehealth: Payer: Self-pay | Admitting: Family Medicine

## 2018-06-21 NOTE — Telephone Encounter (Signed)
I referred the patient to the hypertension specialist in August Is she not seeing him? Please have her contact his office if established If not established, find out why not and RE-REFER to hypertension specialist (nephrologist), dx uncontrolled hypertension If her pressure is that high, I recommend an evaluate at urgent care or ER today; 217 systolic is too high

## 2018-06-21 NOTE — Telephone Encounter (Signed)
Left detailed VM with patients daughter [name] and advised her to take Lesslie to an urgent care or the ER if her pressure is that high. CRM created.

## 2018-06-21 NOTE — Telephone Encounter (Signed)
Copied from CRM [id number]. Topic: General - Other >> Jun 21, 2018  9:26 AM Tamela Oddi wrote: Reason for CRM: Patient's daughter called to inform the doctor that her mother's BP was checked yesterday 06/20/18, and it was 217/69.  This is concerning and daughter believes her medication needs to be reviewed and possibly changed. Patient has been scheduled for an appointment on 06/24/2018.

## 2018-06-24 ENCOUNTER — Ambulatory Visit (INDEPENDENT_AMBULATORY_CARE_PROVIDER_SITE_OTHER): Payer: Medicare Other | Admitting: Family Medicine

## 2018-06-24 ENCOUNTER — Encounter: Payer: Self-pay | Admitting: Family Medicine

## 2018-06-24 DIAGNOSIS — F411 Generalized anxiety disorder: Secondary | ICD-10-CM | POA: Diagnosis not present

## 2018-06-24 DIAGNOSIS — I10 Essential (primary) hypertension: Secondary | ICD-10-CM | POA: Diagnosis not present

## 2018-06-24 MED ORDER — TRIAMTERENE-HCTZ 37.5-25 MG PO TABS: 0.5000 | ORAL_TABLET | Freq: Every day | ORAL | 0 refills | Status: DC

## 2018-06-24 NOTE — Assessment & Plan Note (Addendum)
She believes her blood pressure is affected by her anxiety; anxiety runs in the family; avoid addition of salt to food; avoid frozen dinners; will have her stop the once daily half clonidine and start half of a pill of triamterene/hctz to address the systolic component; return in 2 days for BP check and 4 days for BP check and BMP; reasons to seek medical attention reviewed; stay hydrated; call 911 if s/s of stroke, see AVS

## 2018-06-24 NOTE — Assessment & Plan Note (Signed)
Patient has seen psychiatrist; note reviewed; patient does not want other medicines for anxiety, just the benzodiazepine; she declined psychotherapy; psychiatrist has released her; I am not comfortable prescribing benzos to an 85 year old lady; explained similar in effects to alcohol; she is persistent that she wishes to continue the benzo; neither of my colleagues here wish to prescribe this for her either; patient has decided that she will leave the practice and find another prescriber so she can continue her benzodiazepine; I respect her wishes and she leaves here in good standing

## 2018-06-24 NOTE — Patient Instructions (Addendum)
Start just one-half of a pill of the new blood pressure medicine once a day Return in 2 days (Wednesday) and in 4 days (Friday) for blood pressure checks Stay well hydrated  You are welcome to find another physician who will prescribe the Ativan (lorazepam) for you  Do not take the Clonidine every single day regularly; just take if needed for elevated pressure  Try to follow the DASH guidelines (DASH stands for Dietary Approaches to Stop Hypertension). Try to limit the sodium in your diet to no more than 1,500mg  of sodium per day. Certainly try to not exceed 2,000 mg per day at the very most. Do not add salt when cooking or at the table.  Check the sodium amount on labels when shopping, and choose items lower in sodium when given a choice. Avoid or limit foods that already contain a lot of sodium. Eat a diet rich in fruits and vegetables and whole grains, and try to lose weight if overweight or obese  If you get light-headed or dizzy or think you are having a stroke, call 911   Stroke Prevention Some medical conditions and lifestyle choices can lead to a higher risk for a stroke. You can help to prevent a stroke by making nutrition, lifestyle, and other changes. What nutrition changes can be made?   Eat healthy foods. ? Choose foods that are high in fiber. These include:  Fresh fruits.  Fresh vegetables.  Whole grains. ? Eat at least 5 or more servings of fruits and vegetables each day. Try to fill half of your plate at each meal with fruits and vegetables. ? Choose lean protein foods. These include:  Lowfat (lean) cuts of meat.  Chicken without skin.  Fish.  Tofu.  Beans.  Nuts. ? Eat low-fat dairy products. ? Avoid foods that:  Are high in salt (sodium).  Have saturated fat.  Have trans fat.  Have cholesterol.  Are processed.  Are premade.  Follow eating guidelines as told by your doctor. These may include: ? Reducing how many calories you eat and drink each  day. ? Limiting how much salt you eat or drink each day to 1,500 milligrams (mg). ? Using only healthy fats for cooking. These include:  Olive oil.  Canola oil.  Sunflower oil. ? Counting how many carbohydrates you eat and drink each day. What lifestyle changes can be made?  Try to stay at a healthy weight. Talk to your doctor about what a good weight is for you.  Get at least 30 minutes of moderate physical activity at least 5 days a week. This can include: ? Fast walking. ? Biking. ? Swimming.  Do not use any products that have nicotine or tobacco. This includes cigarettes and e-cigarettes. If you need help quitting, ask your doctor. Avoid being around tobacco smoke in general.  Limit how much alcohol you drink to no more than 1 drink a day for nonpregnant women and 2 drinks a day for men. One drink equals 12 oz of beer, 5 oz of wine, or 1 oz of hard liquor.  Do not use drugs.  Avoid taking birth control pills. Talk to your doctor about the risks of taking birth control pills if: ? You are over 7 years old. ? You smoke. ? You get migraines. ? You have had a blood clot. What other changes can be made?  Manage your cholesterol. ? It is important to eat a healthy diet. ? If your cholesterol cannot be managed through your  diet, you may also need to take medicines. Take medicines as told by your doctor.  Manage your diabetes. ? It is important to eat a healthy diet and to exercise regularly. ? If your blood sugar cannot be managed through diet and exercise, you may need to take medicines. Take medicines as told by your doctor.  Control your high blood pressure (hypertension). ? Try to keep your blood pressure below 130/80. This can help lower your risk of stroke. ? It is important to eat a healthy diet and to exercise regularly. ? If your blood pressure cannot be managed through diet and exercise, you may need to take medicines. Take medicines as told by your doctor. ? Ask  your doctor if you should check your blood pressure at home. ? Have your blood pressure checked every year. Do this even if your blood pressure is normal.  Talk to your doctor about getting checked for a sleep disorder. Signs of this can include: ? Snoring a lot. ? Feeling very tired.  Take over-the-counter and prescription medicines only as told by your doctor. These may include aspirin or blood thinners (antiplatelets or anticoagulants).  Make sure that any other medical conditions you have are managed. Where to find more information  American Stroke Association: www.strokeassociation.org  National Stroke Association: www.stroke.org Get help right away if:  You have any symptoms of stroke. "BE FAST" is an easy way to remember the main warning signs: ? B - Balance. Signs are dizziness, sudden trouble walking, or loss of balance. ? E - Eyes. Signs are trouble seeing or a sudden change in how you see. ? F - Face. Signs are sudden weakness or loss of feeling of the face, or the face or eyelid drooping on one side. ? A - Arms. Signs are weakness or loss of feeling in an arm. This happens suddenly and usually on one side of the body. ? S - Speech. Signs are sudden trouble speaking, slurred speech, or trouble understanding what people say. ? T - Time. Time to call emergency services. Write down what time symptoms started.  You have other signs of stroke, such as: ? A sudden, very bad headache with no known cause. ? Feeling sick to your stomach (nausea). ? Throwing up (vomiting). ? Jerky movements you cannot control (seizure). These symptoms may represent a serious problem that is an emergency. Do not wait to see if the symptoms will go away. Get medical help right away. Call your local emergency services (911 in the [place] Do not drive yourself to the hospital. Summary  You can prevent a stroke by eating healthy, exercising, not smoking, drinking less alcohol, and treating other health  problems, such as diabetes, high blood pressure, or high cholesterol.  Do not use any products that contain nicotine or tobacco, such as cigarettes and e-cigarettes.  Get help right away if you have any signs or symptoms of a stroke. This information is not intended to replace advice given to you by your health care provider. Make sure you discuss any questions you have with your health care provider. Document Released: 11/07/2011 Document Revised: 08/09/2016 Document Reviewed: 08/09/2016 Elsevier Interactive Patient Education  2019 [employer].

## 2018-06-24 NOTE — Progress Notes (Signed)
BP (!) 162/80   Pulse 81   Temp 98 F (36.7 C)   Ht 5' (1.524 m)   Wt 101 lb 3.2 oz (45.9 kg)   SpO2 96%   BMI 19.76 kg/m    Subjective:    Patient ID: Tamara Sparks, female    DOB: 01-Mar-1929, 85 y.o.   MRN: [id number]  HPI: Tamara Sparks is a 85 y.o. female  Chief Complaint  Patient presents with  . Follow-up    HPI Patient is here for f/u  Patient refused to see a blood pressure specialist, referred to nephrologist in August but did not go She is on metoprolol 50 mg daily They stopped her checking her BP at home; she would get upset when checking her BP at home and she'd get upset and family would have to leave work and come see her to calm her down This is how she has been all her life She saw the psychiatrist a few days ago The psychiatrist released her apparently The psychiatrist asked her what makes her happy and what makes her sad Patient won't take the other medicines for depression and anxiety Took zoloft for a few days and stopped taking it She does not want to change anything; the psychiatrist prescribed mirtazipine but she would not take it She is by herself and is afraid to take new things Unless she is in a facility where she can monitored She does not drink any alcohol Her Tamara worried all the time, Tamara Sparks Does not add salt to food; not a meat eater; no frozen dinners Lonely but does not want to be around people She declined psychotherapy She was on amlodipine but the patient stopped that; it made her feet and legs swell She has clonidine; she isn't taking the BP medicine all the time; not checking BP; taking 1/2 pill  She was on bystolic, carvedilol, losartan, HCTZ  [facility] 06/24/2018 04/02/2018 01/01/2018 09/04/2017 06/19/2017  Decreased Interest 0 3 0 1 0  Down, Depressed, Hopeless 0 3 3 1  0  PHQ - 2 Score 0 6 3 2  0  Altered sleeping 0 0 0 0 -  Tired, decreased energy 0 0 1 1 -  Change in appetite 0 0 1 1 -  Feeling  bad or failure about yourself  0 0 0 0 -  Trouble concentrating 0 0 0 0 -  Moving slowly or fidgety/restless 0 0 1 0 -  Suicidal thoughts 0 0 0 0 -  PHQ-9 Score 0 6 6 4  -  Difficult doing work/chores Not difficult at all Not difficult at all Not difficult at all Not difficult at all -   Fall Risk  06/24/2018 04/02/2018 02/22/2018 02/22/2018 01/01/2018  Falls in the past year? 0 1 No No Yes  Comment - - - - -  Number falls in past yr: - 0 - - 1  Injury with Fall? - 0 - - No  Risk Factor Category  - - - - -  Risk for fall due to : - - - - -  Risk for fall due to: Comment - - - - -  Follow up - - - - -    Relevant past medical, surgical, family and social history reviewed Past Medical History:  Diagnosis Date  . Anxiety state, unspecified   . Cardiac dysrhythmia, unspecified   . Depression   . Dyslipidemia, goal LDL below 130   . Dyspnea    DOE, patient  does alot of work for living alone  . Dysrhythmia    patient unsure of this  . Heart murmur   . History of stroke    Right cerebellar intracerebral hemorrhage  . HOH (hard of hearing)   . Hyposmolality and/or hyponatremia   . Stroke [facility]) 2012   right hand shakes and can't write well  . Tremors of nervous system    RIGHT ARM  . Tuberculosis 1947   RIGHT UPPER LOBE EXCISION AGE 38  . Unspecified essential hypertension   . Unspecified vitamin D deficiency   . Urinary tract infection, site not specified    Past Surgical History:  Procedure Laterality Date  . CARDIAC CATHETERIZATION     [facility]  . CATARACT EXTRACTION W/PHACO Right 03/08/2016   Procedure: CATARACT EXTRACTION PHACO AND INTRAOCULAR LENS PLACEMENT (IOC);  Surgeon: Sallee Lange, MD;  Location: [facility] ORS;  Service: Ophthalmology;  Laterality: Right;   FLUID CASSETTE [id number] H, EXP 07/19/17 [place]    3:16.8   AP%   27.2 CDE 94.14  . EXTERNAL EAR SURGERY Left   . LOBECTOMY Right 1952   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17   Family History  Problem Relation Age of Onset    . Stroke Mother   . Hypertension Father   . Stroke Father   . Hypertension Sister   . Hypertension Brother   . Cancer Brother        brain  . Hypertension Sister   . Hypertension Brother   . Cancer Brother        stomach   Social History   Tobacco Use  . Smoking status: Never Smoker  . Smokeless tobacco: Never Used  . Tobacco comment: smoking cessation materials not required  Substance Use Topics  . Alcohol use: No    Alcohol/week: 0.0 standard drinks  . Drug use: No     Office Visit from 06/24/2018 in [facility]  AUDIT-C Score  0      Interim medical history since last visit reviewed. Allergies and medications reviewed  Review of Systems Per HPI unless specifically indicated above     Objective:    BP (!) 162/80   Pulse 81   Temp 98 F (36.7 C)   Ht 5' (1.524 m)   Wt 101 lb 3.2 oz (45.9 kg)   SpO2 96%   BMI 19.76 kg/m   Wt Readings from Last 3 Encounters:  06/24/18 101 lb 3.2 oz (45.9 kg)  06/20/18 102 lb 3.2 oz (46.4 kg)  04/02/18 101 lb 9.6 oz (46.1 kg)    Physical Exam Constitutional:      Appearance: She is well-developed. She is not ill-appearing or diaphoretic.  Eyes:     General: No scleral icterus. Neck:     Vascular: No carotid bruit or JVD.  Cardiovascular:     Rate and Rhythm: Normal rate and regular rhythm.  Pulmonary:     Effort: Pulmonary effort is normal.     Breath sounds: Normal breath sounds.  Abdominal:     General: There is no abdominal bruit.  Neurological:     Mental Status: She is alert.  Psychiatric:        Mood and Affect: Mood is not anxious or depressed.        Behavior: Behavior normal.     Comments: Good eye contact with examiner     Results for orders placed or performed in visit on 02/22/18  BASIC METABOLIC PANEL WITH GFR  Result  Value Ref Range   Glucose, Bld 96 65 - 139 mg/dL   BUN 12 7 - 25 mg/dL   Creat 1.610.81 0.960.60 - 0.450.88 mg/dL   GFR, Est Non African American 65 > OR = 60  mL/min/1.4573m2   GFR, Est African American 76 > OR = 60 mL/min/1.2673m2   BUN/Creatinine Ratio NOT APPLICABLE 6 - 22 (calc)   Sodium 138 135 - 146 mmol/L   Potassium 4.2 3.5 - 5.3 mmol/L   Chloride 100 98 - 110 mmol/L   CO2 31 20 - 32 mmol/L   Calcium 10.2 8.6 - 10.4 mg/dL  POCT Urinalysis Dipstick  Result Value Ref Range   Color, UA Yellow    Clarity, UA Clear    Glucose, UA Negative Negative   Bilirubin, UA Negative    Ketones, UA Negative    Spec Grav, UA 1.010 1.010 - 1.025   Blood, UA Negative    pH, UA 6.5 5.0 - 8.0   Protein, UA Negative Negative   Urobilinogen, UA 0.2 0.2 or 1.0 E.U./dL   Nitrite, UA Negative    Leukocytes, UA Negative Negative   Appearance Normal    Odor Normal       Assessment & Plan:   Problem List Items Addressed This Visit      Cardiovascular and Mediastinum   Essential hypertension (Chronic)    She believes her blood pressure is affected by her anxiety; anxiety runs in the family; avoid addition of salt to food; avoid frozen dinners; will have her stop the once daily half clonidine and start half of a pill of triamterene/hctz to address the systolic component; return in 2 days for BP check and 4 days for BP check and BMP; reasons to seek medical attention reviewed; stay hydrated; call 911 if s/s of stroke, see AVS      Relevant Medications   triamterene-hydrochlorothiazide (MAXZIDE-25) 37.5-25 MG tablet     Other   Anxiety disorder    Patient has seen psychiatrist; note reviewed; patient does not want other medicines for anxiety, just the benzodiazepine; she declined psychotherapy; psychiatrist has released her; I am not comfortable prescribing benzos to an 85 year old lady; explained similar in effects to alcohol; she is persistent that she wishes to continue the benzo; neither of my colleagues here wish to prescribe this for her either; patient has decided that she will leave the practice and find another prescriber so she can continue her  benzodiazepine; I respect her wishes and she leaves here in good standing          Follow up plan: Return in about 2 days (around 06/26/2018) for blood pressure recheck with CMA and again on Friday.  An after-visit summary was printed and given to the patient at check-out.  Please see the patient instructions which may contain other information and recommendations beyond what is mentioned above in the assessment and plan.  Meds ordered this encounter  Medications  . triamterene-hydrochlorothiazide (MAXZIDE-25) 37.5-25 MG tablet    Sig: Take 0.5 tablets by mouth daily. Hydrate really well    Dispense:  15 tablet    Refill:  0    No orders of the defined types were placed in this encounter.

## 2018-06-26 ENCOUNTER — Ambulatory Visit: Payer: Self-pay

## 2018-06-28 ENCOUNTER — Ambulatory Visit: Payer: Self-pay

## 2018-07-02 ENCOUNTER — Encounter: Payer: Self-pay | Admitting: Family Medicine

## 2018-07-02 ENCOUNTER — Ambulatory Visit (INDEPENDENT_AMBULATORY_CARE_PROVIDER_SITE_OTHER): Payer: Medicare Other | Admitting: Family Medicine

## 2018-07-02 VITALS — BP 182/86 | HR 71 | Temp 98.0°F | Resp 16 | Ht 60.0 in | Wt 98.6 lb

## 2018-07-02 DIAGNOSIS — I10 Essential (primary) hypertension: Secondary | ICD-10-CM

## 2018-07-02 DIAGNOSIS — F411 Generalized anxiety disorder: Secondary | ICD-10-CM

## 2018-07-02 MED ORDER — ESCITALOPRAM OXALATE 5 MG PO TABS: 5.0000 mg | ORAL_TABLET | Freq: Every day | ORAL | 0 refills | Status: DC

## 2018-07-02 NOTE — Assessment & Plan Note (Signed)
Affected by her anxiety; breathing exercises demonstrated today; patient did calm down and look noticeably more relaxed by the end of the visit today, but I opted to not recheck her BP which could have increased her worry; she will try the relaxation exercise, mindfulness; start SSRI; continue BP meds; close f/u

## 2018-07-02 NOTE — Progress Notes (Signed)
BP (!) 182/86   Pulse 71   Temp 98 F (36.7 C) (Oral)   Resp 16   Ht 5' (1.524 m)   Wt 98 lb 9.6 oz (44.7 kg)   SpO2 98%   BMI 19.26 kg/m    Subjective:    Patient ID: Tamara Sparks, female    DOB: 1928/10/11, 85 y.o.   MRN: [id number]  HPI: Tamara Sparks is a 85 y.o. female  Chief Complaint  Patient presents with  . Hypertension  . Anxiety    HPI  Patient is worked in today during a blood pressure check because of anxiety She denies chest pain; just anxious and breathing faster initially because she is trying to wean herself down on her benzo; this is causing her anxiety She has been taking half of a pill since Saturday just once a day instead of one whole pill once a day Does feel shakier; has baseline tremor anyway She says she is 'by myself" and all she thinks about is what she's been through Dr. Elna Breslow prescribed her sertraline; she did not like that medicine Also put her on mirtazapine; that put her in a weird deep sleep Patient is interested in psychotherapy She was in a sanitorium for 4 years, taken from her family at age 35; keeps thinking about that and picturing it They are trying to find another provider who will prescribe the benzo for her, but no one will tell them over the phone if they're willing to prescribe it for her or not  Depression screen [facility] 2/9 06/24/2018 04/02/2018 01/01/2018 09/04/2017 06/19/2017  Decreased Interest 0 3 0 1 0  Down, Depressed, Hopeless 0 3 3 1  0  PHQ - 2 Score 0 6 3 2  0  Altered sleeping 0 0 0 0 -  Tired, decreased energy 0 0 1 1 -  Change in appetite 0 0 1 1 -  Feeling bad or failure about yourself  0 0 0 0 -  Trouble concentrating 0 0 0 0 -  Moving slowly or fidgety/restless 0 0 1 0 -  Suicidal thoughts 0 0 0 0 -  PHQ-9 Score 0 6 6 4  -  Difficult doing work/chores Not difficult at all Not difficult at all Not difficult at all Not difficult at all -   Fall Risk  06/24/2018 04/02/2018 02/22/2018 02/22/2018 01/01/2018    Falls in the past year? 0 1 No No Yes  Comment - - - - -  Number falls in past yr: - 0 - - 1  Injury with Fall? - 0 - - No  Risk Factor Category  - - - - -  Risk for fall due to : - - - - -  Risk for fall due to: Comment - - - - -  Follow up - - - - -    Relevant past medical, surgical, family and social history reviewed Past Medical History:  Diagnosis Date  . Anxiety state, unspecified   . Cardiac dysrhythmia, unspecified   . Depression   . Dyslipidemia, goal LDL below 130   . Dyspnea    DOE, patient does alot of work for living alone  . Dysrhythmia    patient unsure of this  . Heart murmur   . History of stroke    Right cerebellar intracerebral hemorrhage  . HOH (hard of hearing)   . Hyposmolality and/or hyponatremia   . Stroke [facility]) 2012   right hand shakes and can't write well  .  Tremors of nervous system    RIGHT ARM  . Tuberculosis 1947   RIGHT UPPER LOBE EXCISION AGE 75  . Unspecified essential hypertension   . Unspecified vitamin D deficiency   . Urinary tract infection, site not specified    Past Surgical History:  Procedure Laterality Date  . CARDIAC CATHETERIZATION     [facility]  . CATARACT EXTRACTION W/PHACO Right 03/08/2016   Procedure: CATARACT EXTRACTION PHACO AND INTRAOCULAR LENS PLACEMENT (IOC);  Surgeon: Sallee Lange, MD;  Location: [facility] ORS;  Service: Ophthalmology;  Laterality: Right;   FLUID CASSETTE [id number] H, EXP 07/19/17 [place]    3:16.8   AP%   27.2 CDE 94.14  . EXTERNAL EAR SURGERY Left   . LOBECTOMY Right 1952   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17   Family History  Problem Relation Age of Onset  . Stroke Mother   . Hypertension Father   . Stroke Father   . Hypertension Sister   . Hypertension Brother   . Cancer Brother        brain  . Hypertension Sister   . Hypertension Brother   . Cancer Brother        stomach   Social History   Tobacco Use  . Smoking status: Never Smoker  . Smokeless tobacco: Never Used  . Tobacco comment:  smoking cessation materials not required  Substance Use Topics  . Alcohol use: No    Alcohol/week: 0.0 standard drinks  . Drug use: No     Office Visit from 06/24/2018 in [facility]  AUDIT-C Score  0      Interim medical history since last visit reviewed. Allergies and medications reviewed  Review of Systems Per HPI unless specifically indicated above     Objective:    BP (!) 182/86   Pulse 71   Temp 98 F (36.7 C) (Oral)   Resp 16   Ht 5' (1.524 m)   Wt 98 lb 9.6 oz (44.7 kg)   SpO2 98%   BMI 19.26 kg/m   Wt Readings from Last 3 Encounters:  07/02/18 98 lb 9.6 oz (44.7 kg)  06/24/18 101 lb 3.2 oz (45.9 kg)  06/20/18 102 lb 3.2 oz (46.4 kg)    Physical Exam Constitutional:      General: She is not in acute distress.    Appearance: She is well-developed.  Eyes:     General: No scleral icterus. Cardiovascular:     Rate and Rhythm: Normal rate and regular rhythm.  Pulmonary:     Effort: Pulmonary effort is normal.     Breath sounds: Normal breath sounds.  Neurological:     Mental Status: She is alert.  Psychiatric:        Mood and Affect: Mood is anxious. Mood is not depressed. Affect is not inappropriate.        Behavior: Behavior normal.     Comments: Anxiety, but good eye contact with examiner; relaxed later in the visit, performed breathing exercise with examiner with good results        Assessment & Plan:   Problem List Items Addressed This Visit      Cardiovascular and Mediastinum   Essential hypertension (Chronic)    Affected by her anxiety; breathing exercises demonstrated today; patient did calm down and look noticeably more relaxed by the end of the visit today, but I opted to not recheck her BP which could have increased her worry; she will try the relaxation exercise, mindfulness; start SSRI;  continue BP meds; close f/u        Other   Anxiety disorder - Primary    With addiction to benzodiazepine; discussed option of  rehab center if too difficult and patient could be in monitored environment while she weans down; she is not interested; we talked at length about the past, present, future, being mindful; breathing exercises; she agrees (today at least) to work with a therapist, so referral entered; I communicated with her psychiatrist as well and let her know that patient sounds like she might be more willing and open to other options that just the benzo; patient will call psychiatrist if they choose to go back and see her; patient does agree to try lexapro 5 mg daily; call with any side effects; close f/u, 2-3 weeks, family opted for 3 weeks      Relevant Medications   escitalopram (LEXAPRO) 5 MG tablet   Other Relevant Orders   Ambulatory referral to Psychology       Follow up plan: Return in about 3 weeks (around 07/23/2018) for follow-up visit with Dr. Sherie DonLada.  An after-visit summary was printed and given to the patient at check-out.  Please see the patient instructions which may contain other information and recommendations beyond what is mentioned above in the assessment and plan.  Meds ordered this encounter  Medications  . escitalopram (LEXAPRO) 5 MG tablet    Sig: Take 1 tablet (5 mg total) by mouth daily.    Dispense:  30 tablet    Refill:  0    Orders Placed This Encounter  Procedures  . Ambulatory referral to Psychology

## 2018-07-02 NOTE — Patient Instructions (Signed)
12 Ways to Curb Anxiety  ?Anxiety is normal human sensation. It is what helped our ancestors survive the pitfalls of the wilderness. Anxiety is defined as experiencing worry or nervousness about an imminent event or something with an uncertain outcome. It is a feeling experienced by most people at some point in their lives. Anxiety can be triggered by a very personal issue, such as the illness of a loved one, or an event of global proportions, such as a refugee crisis. Some of the symptoms of anxiety are:  Feeling restless.  Having a feeling of impending danger.  Increased heart rate.  Rapid breathing. Sweating.  Shaking.  Weakness or feeling tired.  Difficulty concentrating on anything except the current worry.  Insomnia.  Stomach or bowel problems. What can we do about anxiety we may be feeling? There are many techniques to help manage stress and relax. Here are 12 ways you can reduce your anxiety almost immediately: 1. Turn off the constant feed of information. Take a social media sabbatical. Studies have shown that social media directly contributes to social anxiety.  2. Monitor your television viewing habits. Are you watching shows that are also contributing to your anxiety, such as 24-hour news stations? Try watching something else, or better yet, nothing at all. Instead, listen to music, read an inspirational book or practice a hobby. 3. Eat nutritious meals. Also, don't skip meals and keep healthful snacks on hand. Hunger and poor diet contributes to feeling anxious. 4. Sleep. Sleeping on a regular schedule for at least seven to eight hours a night will do wonders for your outlook when you are awake. 5. Exercise. Regular exercise will help rid your body of that anxious energy and help you get more restful sleep. 6. Try deep (diaphragmatic) breathing. Inhale slowly through your nose for five seconds and exhale through your mouth. 7. Practice acceptance and gratitude. When anxiety hits,  accept that there are things out of your control that shouldn't be of immediate concern.  8. Seek out humor. When anxiety strikes, watch a funny video, read jokes or call a friend who makes you laugh. Laughter is healing for our bodies and releases endorphins that are calming. 9. Stay positive. Take the effort to replace negative thoughts with positive ones. Try to see a stressful situation in a positive light. Try to come up with solutions rather than dwelling on the problem. 10. Figure out what triggers your anxiety. Keep a journal and make note of anxious moments and the events surrounding them. This will help you identify triggers you can avoid or even eliminate. 11. Talk to someone. Let a trusted friend, family member or even trained professional know that you are feeling overwhelmed and anxious. Verbalize what you are feeling and why.  12. Volunteer. If your anxiety is triggered by a crisis on a large scale, become an advocate and work to resolve the problem that is causing you unease. Anxiety is often unwelcome and can become overwhelming. If not kept in check, it can become a disorder that could require medical treatment. However, if you take the time to care for yourself and avoid the triggers that make you anxious, you will be able to find moments of relaxation and clarity that make your life much more enjoyable.   

## 2018-07-02 NOTE — Assessment & Plan Note (Signed)
With addiction to benzodiazepine; discussed option of rehab center if too difficult and patient could be in monitored environment while she weans down; she is not interested; we talked at length about the past, present, future, being mindful; breathing exercises; she agrees (today at least) to work with a therapist, so referral entered; I communicated with her psychiatrist as well and let her know that patient sounds like she might be more willing and open to other options that just the benzo; patient will call psychiatrist if they choose to go back and see her; patient does agree to try lexapro 5 mg daily; call with any side effects; close f/u, 2-3 weeks, family opted for 3 weeks

## 2018-07-03 ENCOUNTER — Ambulatory Visit: Payer: Self-pay | Admitting: Family Medicine

## 2018-07-05 ENCOUNTER — Ambulatory Visit: Payer: Self-pay

## 2018-07-05 ENCOUNTER — Encounter: Payer: Self-pay | Admitting: Emergency Medicine

## 2018-07-05 ENCOUNTER — Inpatient Hospital Stay
Admission: EM | Admit: 2018-07-05 | Discharge: 2018-07-08 | DRG: 641 | Disposition: A | Discharge status: Home / Self Care | Payer: Medicare Other | Attending: Internal Medicine | Admitting: Internal Medicine

## 2018-07-05 DIAGNOSIS — F419 Anxiety disorder, unspecified: Secondary | ICD-10-CM | POA: Diagnosis present

## 2018-07-05 DIAGNOSIS — F329 Major depressive disorder, single episode, unspecified: Secondary | ICD-10-CM | POA: Diagnosis present

## 2018-07-05 DIAGNOSIS — I160 Hypertensive urgency: Secondary | ICD-10-CM | POA: Diagnosis present

## 2018-07-05 DIAGNOSIS — Z8249 Family history of ischemic heart disease and other diseases of the circulatory system: Secondary | ICD-10-CM | POA: Diagnosis not present

## 2018-07-05 DIAGNOSIS — R42 Dizziness and giddiness: Secondary | ICD-10-CM | POA: Diagnosis not present

## 2018-07-05 DIAGNOSIS — E860 Dehydration: Secondary | ICD-10-CM | POA: Diagnosis present

## 2018-07-05 DIAGNOSIS — E878 Other disorders of electrolyte and fluid balance, not elsewhere classified: Secondary | ICD-10-CM | POA: Diagnosis present

## 2018-07-05 DIAGNOSIS — E785 Hyperlipidemia, unspecified: Secondary | ICD-10-CM | POA: Diagnosis present

## 2018-07-05 DIAGNOSIS — E876 Hypokalemia: Secondary | ICD-10-CM | POA: Diagnosis not present

## 2018-07-05 DIAGNOSIS — R739 Hyperglycemia, unspecified: Secondary | ICD-10-CM | POA: Diagnosis present

## 2018-07-05 DIAGNOSIS — T502X5A Adverse effect of carbonic-anhydrase inhibitors, benzothiadiazides and other diuretics, initial encounter: Secondary | ICD-10-CM | POA: Diagnosis present

## 2018-07-05 DIAGNOSIS — Z7982 Long term (current) use of aspirin: Secondary | ICD-10-CM | POA: Diagnosis not present

## 2018-07-05 DIAGNOSIS — R1310 Dysphagia, unspecified: Secondary | ICD-10-CM | POA: Diagnosis present

## 2018-07-05 DIAGNOSIS — Z8673 Personal history of transient ischemic attack (TIA), and cerebral infarction without residual deficits: Secondary | ICD-10-CM | POA: Diagnosis not present

## 2018-07-05 DIAGNOSIS — Z823 Family history of stroke: Secondary | ICD-10-CM | POA: Diagnosis not present

## 2018-07-05 DIAGNOSIS — R110 Nausea: Secondary | ICD-10-CM | POA: Diagnosis not present

## 2018-07-05 DIAGNOSIS — Z880 Allergy status to penicillin: Secondary | ICD-10-CM

## 2018-07-05 DIAGNOSIS — E871 Hypo-osmolality and hyponatremia: Secondary | ICD-10-CM | POA: Diagnosis not present

## 2018-07-05 DIAGNOSIS — I10 Essential (primary) hypertension: Secondary | ICD-10-CM | POA: Diagnosis not present

## 2018-07-05 DIAGNOSIS — E559 Vitamin D deficiency, unspecified: Secondary | ICD-10-CM | POA: Diagnosis present

## 2018-07-05 DIAGNOSIS — R748 Abnormal levels of other serum enzymes: Secondary | ICD-10-CM | POA: Diagnosis present

## 2018-07-05 DIAGNOSIS — R531 Weakness: Secondary | ICD-10-CM | POA: Diagnosis not present

## 2018-07-05 LAB — COMPREHENSIVE METABOLIC PANEL
ALT: 14 U/L (ref 0–44)
AST: 25 U/L (ref 15–41)
Albumin: 4.3 g/dL (ref 3.5–5.0)
Alkaline Phosphatase: 69 U/L (ref 38–126)
Anion gap: 9 (ref 5–15)
BUN: 10 mg/dL (ref 8–23)
CO2: 31 mmol/L (ref 22–32)
Calcium: 9.5 mg/dL (ref 8.9–10.3)
Chloride: 81 mmol/L — ABNORMAL LOW (ref 98–111)
Creatinine, Ser: 0.68 mg/dL (ref 0.44–1.00)
GFR calc Af Amer: >60 mL/min (ref >60)
GFR calc non Af Amer: >60 mL/min (ref >60)
Glucose, Bld: 139 mg/dL — ABNORMAL HIGH (ref 70–99)
POTASSIUM: 3.3 mmol/L — AB (ref 3.5–5.1)
Sodium: 121 mmol/L — ABNORMAL LOW (ref 135–145)
Total Bilirubin: 1.2 mg/dL (ref 0.3–1.2)
Total Protein: 7.4 g/dL (ref 6.5–8.1)

## 2018-07-05 LAB — PHOSPHORUS: Phosphorus: 2.9 mg/dL (ref 2.5–4.6)

## 2018-07-05 LAB — LIPASE, BLOOD: Lipase: 63 U/L — ABNORMAL HIGH (ref 11–51)

## 2018-07-05 LAB — CBC
HCT: 39.7 % (ref 36.0–46.0)
HEMOGLOBIN: 14.4 g/dL (ref 12.0–15.0)
MCH: 30.1 pg (ref 26.0–34.0)
MCHC: 36.3 g/dL — ABNORMAL HIGH (ref 30.0–36.0)
MCV: 82.9 fL (ref 80.0–100.0)
Platelets: 409 10*3/uL — ABNORMAL HIGH (ref 150–400)
RBC: 4.79 MIL/uL (ref 3.87–5.11)
RDW: 11.9 % (ref 11.5–15.5)
WBC: 9.7 10*3/uL (ref 4.0–10.5)
nRBC: 0 % (ref 0.0–0.2)

## 2018-07-05 LAB — BRAIN NATRIURETIC PEPTIDE: B Natriuretic Peptide: 85 pg/mL (ref 0.0–100.0)

## 2018-07-05 LAB — TROPONIN I: Troponin I: <0.03 ng/mL (ref <0.03)

## 2018-07-05 LAB — MAGNESIUM: Magnesium: 2.1 mg/dL (ref 1.7–2.4)

## 2018-07-05 MED ORDER — ONDANSETRON HCL 4 MG/2ML IJ SOLN
4.0000 mg | Freq: Four times a day (QID) | INTRAMUSCULAR | Status: DC | PRN
  Administered 2018-07-06: 4 mg via INTRAVENOUS
  Filled 2018-07-05: qty 2

## 2018-07-05 MED ORDER — ACETAMINOPHEN 325 MG PO TABS: 650.0000 mg | ORAL_TABLET | Freq: Four times a day (QID) | ORAL | Status: DC | PRN

## 2018-07-05 MED ORDER — METOPROLOL SUCCINATE ER 50 MG PO TB24
50.0000 mg | ORAL_TABLET | Freq: Every day | ORAL | Status: DC
  Administered 2018-07-06 – 2018-07-08 (×3): 50 mg via ORAL
  Filled 2018-07-05 (×3): qty 1

## 2018-07-05 MED ORDER — SODIUM CHLORIDE 0.9% FLUSH: 3.0000 mL | Freq: Once | INTRAVENOUS | Status: DC

## 2018-07-05 MED ORDER — BISACODYL 5 MG PO TBEC: 5.0000 mg | DELAYED_RELEASE_TABLET | Freq: Every day | ORAL | Status: DC | PRN

## 2018-07-05 MED ORDER — ONDANSETRON HCL 4 MG PO TABS: 4.0000 mg | ORAL_TABLET | Freq: Four times a day (QID) | ORAL | Status: DC | PRN

## 2018-07-05 MED ORDER — ENOXAPARIN SODIUM 30 MG/0.3ML ~~LOC~~ SOLN
30.0000 mg | SUBCUTANEOUS | Status: DC
  Administered 2018-07-06 – 2018-07-07 (×3): 30 mg via SUBCUTANEOUS
  Filled 2018-07-05 (×3): qty 0.3

## 2018-07-05 MED ORDER — SODIUM CHLORIDE 0.9 % IV BOLUS: 1000.0000 mL | Freq: Once | INTRAVENOUS | Status: DC

## 2018-07-05 MED ORDER — LACTATED RINGERS IV SOLN
INTRAVENOUS | Status: DC
  Administered 2018-07-06: 01:00:00 via INTRAVENOUS

## 2018-07-05 MED ORDER — SIMVASTATIN 20 MG PO TABS
20.0000 mg | ORAL_TABLET | Freq: Every day | ORAL | Status: DC
  Administered 2018-07-06 – 2018-07-07 (×2): 20 mg via ORAL
  Filled 2018-07-05 (×2): qty 1

## 2018-07-05 MED ORDER — SENNOSIDES-DOCUSATE SODIUM 8.6-50 MG PO TABS: 1.0000 | ORAL_TABLET | Freq: Every evening | ORAL | Status: DC | PRN

## 2018-07-05 MED ORDER — CLONIDINE HCL 0.1 MG PO TABS
0.1000 mg | ORAL_TABLET | Freq: Once | ORAL | Status: AC
  Administered 2018-07-05: 0.1 mg via ORAL
  Filled 2018-07-05: qty 1

## 2018-07-05 MED ORDER — ESCITALOPRAM OXALATE 10 MG PO TABS
5.0000 mg | ORAL_TABLET | Freq: Every day | ORAL | Status: DC
  Administered 2018-07-06 – 2018-07-08 (×3): 5 mg via ORAL
  Filled 2018-07-05 (×3): qty 0.5

## 2018-07-05 MED ORDER — LORAZEPAM 0.5 MG PO TABS: 0.5000 mg | ORAL_TABLET | Freq: Every day | ORAL | Status: DC | PRN

## 2018-07-05 MED ORDER — ASPIRIN EC 81 MG PO TBEC
81.0000 mg | DELAYED_RELEASE_TABLET | Freq: Every day | ORAL | Status: DC
  Administered 2018-07-06 – 2018-07-08 (×3): 81 mg via ORAL
  Filled 2018-07-05 (×3): qty 1

## 2018-07-05 MED ORDER — NICARDIPINE HCL IN NACL 20-0.86 MG/200ML-% IV SOLN
3.0000 mg/h | INTRAVENOUS | Status: DC
  Filled 2018-07-05: qty 200

## 2018-07-05 MED ORDER — ACETAMINOPHEN 650 MG RE SUPP: 650.0000 mg | Freq: Four times a day (QID) | RECTAL | Status: DC | PRN

## 2018-07-05 NOTE — ED Notes (Signed)
ED TO INPATIENT HANDOFF REPORT  Name/Age/Gender Tamara Sparks 85 y.o. female  Code Status   Home/SNF/Other Home  Chief Complaint dehydrated- sent by MD  Level of Care/Admitting Diagnosis ED Disposition    ED Disposition Condition Comment   Admit  Hospital Area: [facility] [[id number]]  Level of Care: Telemetry [5]  Diagnosis: Hypertensive urgency [828003]  Admitting Physician: Barbaraann Rondo [[id number]]  Attending Physician: Barbaraann Rondo [[id number]]  Estimated length of stay: past midnight tomorrow  Certification:: I certify this patient will need inpatient services for at least 2 midnights  PT Class (Do Not Modify): Inpatient [101]  PT Acc Code (Do Not Modify): Private [1]       Medical History Past Medical History:  Diagnosis Date  . Anxiety state, unspecified   . Cardiac dysrhythmia, unspecified   . Depression   . Dyslipidemia, goal LDL below 130   . Dyspnea    DOE, patient does alot of work for living alone  . Dysrhythmia    patient unsure of this  . Heart murmur   . History of stroke    Right cerebellar intracerebral hemorrhage  . HOH (hard of hearing)   . Hyposmolality and/or hyponatremia   . Stroke [facility]) 2012   right hand shakes and can't write well  . Tremors of nervous system    RIGHT ARM  . Tuberculosis 1947   RIGHT UPPER LOBE EXCISION AGE 24  . Unspecified essential hypertension   . Unspecified vitamin D deficiency   . Urinary tract infection, site not specified     Allergies Allergies  Allergen Reactions  . Sertraline   . Penicillins Rash    Has patient had a PCN reaction causing immediate rash, facial/tongue/throat swelling, SOB or lightheadedness with hypotension: Yes Has patient had a PCN reaction causing severe rash involving mucus membranes or skin necrosis: No Has patient had a PCN reaction that required hospitalization: No Has patient had a PCN reaction occurring within the last 10 years: No If all  of the above answers are "NO", then may proceed with Cephalosporin use.  . Sulfa Antibiotics Other (See Comments)    Pt does not remember but thinks that it just didn't agree with her    IV Location/Drains/Wounds Patient Lines/Drains/Airways Status   Active Line/Drains/Airways    Name:   Placement date:   Placement time:   Site:   Days:   Peripheral IV 07/05/18 Right Antecubital   07/05/18    1957    Antecubital   less than 1   Incision (Closed) 03/08/16 Eye Right   03/08/16    0936     849          Labs/Imaging Results for orders placed or performed during the hospital encounter of 07/05/18 (from the past 48 hour(s))  Lipase, blood     Status: Abnormal   Collection Time: 07/05/18  6:16 PM  Result Value Ref Range   Lipase 63 (H) 11 - 51 U/L    Comment: Performed at [facility], [address] [place], [place] [address]  Comprehensive metabolic panel     Status: Abnormal   Collection Time: 07/05/18  6:16 PM  Result Value Ref Range   Sodium 121 (L) 135 - 145 mmol/L   Potassium 3.3 (L) 3.5 - 5.1 mmol/L   Chloride 81 (L) 98 - 111 mmol/L   CO2 31 22 - 32 mmol/L   Glucose, Bld 139 (H) 70 - 99 mg/dL   BUN 10 8 -  23 mg/dL   Creatinine, Ser 1.74 0.44 - 1.00 mg/dL   Calcium 9.5 8.9 - 08.1 mg/dL   Total Protein 7.4 6.5 - 8.1 g/dL   Albumin 4.3 3.5 - 5.0 g/dL   AST 25 15 - 41 U/L   ALT 14 0 - 44 U/L   Alkaline Phosphatase 69 38 - 126 U/L   Total Bilirubin 1.2 0.3 - 1.2 mg/dL   GFR calc non Af Amer >60 >60 mL/min   GFR calc Af Amer >60 >60 mL/min   Anion gap 9 5 - 15    Comment: Performed at [facility], [address] [place], [place] [address]  CBC     Status: Abnormal   Collection Time: 07/05/18  6:16 PM  Result Value Ref Range   WBC 9.7 4.0 - 10.5 K/uL   RBC 4.79 3.87 - 5.11 MIL/uL   Hemoglobin 14.4 12.0 - 15.0 g/dL   HCT 56.3 14.9 - 70.2 %   MCV 82.9 80.0 - 100.0 fL   MCH 30.1 26.0 - 34.0 pg   MCHC 36.3 (H) 30.0 - 36.0 g/dL   RDW 63.7 85.8 - 85.0  %   Platelets 409 (H) 150 - 400 K/uL   nRBC 0.0 0.0 - 0.2 %    Comment: Performed at [facility], [address], [place], [place] [address]   No results found.  Pending Labs Unresulted Labs (From admission, onward)    Start     Ordered   07/05/18 2156  Troponin I - Add-On to previous collection  Add-on,   AD     07/05/18 2155   07/05/18 2156  Magnesium  Add-on,   AD    Question:  Specimen collection method  Answer:  Unit=Unit collect   07/05/18 2155   07/05/18 2156  Phosphorus  Add-on,   AD    Question:  Specimen collection method  Answer:  Unit=Unit collect   07/05/18 2155   07/05/18 2155  Brain natriuretic peptide  Add-on,   AD     07/05/18 2155   07/05/18 1812  Urinalysis, Complete w Microscopic  ONCE - STAT,   STAT     07/05/18 1812   Signed and Held  Basic metabolic panel  Tomorrow morning,   R     Signed and Held          Vitals/Pain Today's Vitals   07/05/18 1935 07/05/18 2000 07/05/18 2012 07/05/18 2030  BP:   (!) 227/64 (!) 215/77  Pulse:    73  Resp:  16    SpO2:    100%  PainSc: 0-No pain       Isolation Precautions No active isolations  Medications Medications  sodium chloride flush (NS) 0.9 % injection 3 mL (3 mLs Intravenous Not Given 07/05/18 2207)  nicardipine (CARDENE) 20mg  in 0.86% saline IV infusion (0.1 mg/ml) (5 mg/hr Intravenous New Bag/Given 07/05/18 2124)  lactated ringers infusion (has no administration in time range)  sodium chloride 0.9 % bolus 1,000 mL (1,000 mLs Intravenous Bolus 07/05/18 2002)  cloNIDine (CATAPRES) tablet 0.1 mg (0.1 mg Oral Given 07/05/18 2012)    Mobility walks with person assist

## 2018-07-05 NOTE — ED Provider Notes (Signed)
[facility] Emergency Department Provider Note  ____________________________________________  Time seen: Approximately 8:29 PM  I have reviewed the triage vital signs and the nursing notes.   HISTORY  Chief Complaint Dizziness and Nausea    HPI Tamara Sparks is a 85 y.o. female with a history of hypertension, stroke who comes the ED complaining of generalized weakness as well as nausea and dizziness.  Worse with standing.  Having to use a walker lately to stabilize herself so that she does not fall.  She had chronically been on clonidine twice daily among other antihypertensives, but this was causing large swings in her blood pressure.  The patient was checking her blood pressure frequently and getting upset about high numbers.  Her doctor discontinued the clonidine and started her on triamterene/HCTZ on February 3.  No chest pain shortness of breath falls or trauma.  No dysuria.      Past Medical History:  Diagnosis Date  . Anxiety state, unspecified   . Cardiac dysrhythmia, unspecified   . Depression   . Dyslipidemia, goal LDL below 130   . Dyspnea    DOE, patient does alot of work for living alone  . Dysrhythmia    patient unsure of this  . Heart murmur   . History of stroke    Right cerebellar intracerebral hemorrhage  . HOH (hard of hearing)   . Hyposmolality and/or hyponatremia   . Stroke [facility]) 2012   right hand shakes and can't write well  . Tremors of nervous system    RIGHT ARM  . Tuberculosis 1947   RIGHT UPPER LOBE EXCISION AGE 81  . Unspecified essential hypertension   . Unspecified vitamin D deficiency   . Urinary tract infection, site not specified      Patient Active Problem List   Diagnosis Date Noted  . Abnormal ankle brachial index (ABI) 03/11/2018  . Aortic atherosclerosis (HCC) 01/20/2018  . Multiple renal cysts 01/20/2018  . History of TB (tuberculosis) 10/05/2017  . History of lobectomy of lung 10/05/2017  .  Dyslipidemia 09/23/2015  . Hyperglycemia 09/23/2015  . Seasonal allergies 09/09/2015  . Dyslipidemia, goal LDL below 130 03/16/2015  . Anxiety disorder 12/08/2014  . Rapid heart beat 01/23/2014  . Diastolic murmur 01/23/2014  . Essential hypertension 01/23/2014     Past Surgical History:  Procedure Laterality Date  . CARDIAC CATHETERIZATION     MC  . CATARACT EXTRACTION W/PHACO Right 03/08/2016   Procedure: CATARACT EXTRACTION PHACO AND INTRAOCULAR LENS PLACEMENT (IOC);  Surgeon: Sallee Lange, MD;  Location: [facility] ORS;  Service: Ophthalmology;  Laterality: Right;   FLUID CASSETTE [id number] H, EXP 07/19/17 [place]    3:16.8   AP%   27.2 CDE 94.14  . EXTERNAL EAR SURGERY Left   . LOBECTOMY Right 1952   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17     Prior to Admission medications   Medication Sig Start Date End Date Taking? Authorizing Provider  aspirin 81 MG tablet Take 81 mg by mouth daily.    [provider]  Cholecalciferol (VITAMIN D3) 5000 UNITS CAPS Take 1 capsule by mouth daily.     [provider]  cloNIDine (CATAPRES) 0.1 MG tablet TAKE 1 TABLET BY MOUTH 2 (TWO) TIMES DAILY. DO NOT TAKE IF TOP BLOOD PRESSURE IS LESS THAN 145 Patient not taking: Reported on 07/02/2018 06/17/18   Kerman Passey, MD  escitalopram (LEXAPRO) 5 MG tablet Take 1 tablet (5 mg total) by mouth daily. 07/02/18  Kerman Passey, MD  LORazepam (ATIVAN) 0.5 MG tablet Take 1 tablet (0.5 mg total) by mouth daily as needed for anxiety. 04/22/18   Jomarie Longs, MD  metoprolol succinate (TOPROL-XL) 50 MG 24 hr tablet Take 1 tablet (50 mg total) by mouth daily. Take with or immediately following a meal. 04/02/18   Lada, Janit Bern, MD  simvastatin (ZOCOR) 20 MG tablet TAKE 1 TABLET(20 MG) BY MOUTH DAILY AT 6 PM FOR CHOLESTEROL 05/17/18   Lada, Janit Bern, MD  triamterene-hydrochlorothiazide (MAXZIDE-25) 37.5-25 MG tablet Take 0.5 tablets by mouth daily. Hydrate really well 06/24/18   Kerman Passey, MD      Allergies Sertraline; Penicillins; and Sulfa antibiotics   Family History  Problem Relation Age of Onset  . Stroke Mother   . Hypertension Father   . Stroke Father   . Hypertension Sister   . Hypertension Brother   . Cancer Brother        brain  . Hypertension Sister   . Hypertension Brother   . Cancer Brother        stomach    Social History Social History   Tobacco Use  . Smoking status: Never Smoker  . Smokeless tobacco: Never Used  . Tobacco comment: smoking cessation materials not required  Substance Use Topics  . Alcohol use: No    Alcohol/week: 0.0 standard drinks  . Drug use: No    Review of Systems  Constitutional:   No fever or chills.  ENT:   No sore throat. No rhinorrhea. Cardiovascular:   No chest pain or syncope. Respiratory:   No dyspnea or cough. Gastrointestinal:   Negative for abdominal pain, vomiting and diarrhea.  Musculoskeletal:   Negative for focal pain or swelling All other systems reviewed and are negative except as documented above in ROS and HPI.  ____________________________________________   PHYSICAL EXAM:  VITAL SIGNS: ED Triage Vitals  Enc Vitals Group     BP 07/05/18 1807 (!) 227/51     Pulse Rate 07/05/18 1807 68     Resp 07/05/18 1807 18     Temp --      Temp src --      SpO2 07/05/18 1807 98 %     Weight --      Height --      Head Circumference --      Peak Flow --      Pain Score 07/05/18 1808 0     Pain Loc --      Pain Edu? --      Excl. in GC? --     Vital signs reviewed, nursing assessments reviewed.   Constitutional:   Alert and oriented. Non-toxic appearance. Eyes:   Conjunctivae are normal. EOMI. PERRL. ENT      Head:   Normocephalic and atraumatic.      Nose:   No congestion/rhinnorhea.       Mouth/Throat:   Dry mucous membranes, no pharyngeal erythema. No peritonsillar mass.       Neck:   No meningismus. Full ROM. Hematological/Lymphatic/Immunilogical:   No cervical  lymphadenopathy. Cardiovascular:   RRR. Symmetric bilateral radial and DP pulses.  No murmurs. Cap refill less than 2 seconds. Respiratory:   Normal respiratory effort without tachypnea/retractions. Breath sounds are clear and equal bilaterally. No wheezes/rales/rhonchi. Gastrointestinal:   Soft and nontender. Non distended. There is no CVA tenderness.  No rebound, rigidity, or guarding. Musculoskeletal:   Normal range of motion in all extremities. No joint effusions.  No lower extremity tenderness.  No edema. Neurologic:   Normal speech and language.  Motor grossly intact. No acute focal neurologic deficits are appreciated.  Skin:    Skin is warm, dry and intact. No rash noted.  No petechiae, purpura, or bullae.  ____________________________________________    LABS (pertinent positives/negatives) (all labs ordered are listed, but only abnormal results are displayed) Labs Reviewed  LIPASE, BLOOD - Abnormal; Notable for the following components:      Result Value   Lipase 63 (*)    All other components within normal limits  COMPREHENSIVE METABOLIC PANEL - Abnormal; Notable for the following components:   Sodium 121 (*)    Potassium 3.3 (*)    Chloride 81 (*)    Glucose, Bld 139 (*)    All other components within normal limits  CBC - Abnormal; Notable for the following components:   MCHC 36.3 (*)    Platelets 409 (*)    All other components within normal limits  URINALYSIS, COMPLETE (UACMP) WITH MICROSCOPIC   ____________________________________________   EKG  Interpreted by me Sinus rhythm rate of 66, left axis, normal intervals.  Normal QRS ST segments.  Isolated T wave inversion in aVL which is nonspecific.  ____________________________________________    RADIOLOGY  No results found.  ____________________________________________   PROCEDURES .Critical Care Performed by: Sharman Cheek, MD Authorized by: Sharman Cheek, MD   Critical care provider  statement:    Critical care time (minutes):  30   Critical care time was exclusive of:  Separately billable procedures and treating other patients   Critical care was necessary to treat or prevent imminent or life-threatening deterioration of the following conditions:  Circulatory failure and cardiac failure   Critical care was time spent personally by me on the following activities:  Development of treatment plan with patient or surrogate, discussions with consultants, evaluation of patient's response to treatment, examination of patient, obtaining history from patient or surrogate, ordering and performing treatments and interventions, ordering and review of laboratory studies, ordering and review of radiographic studies, pulse oximetry, re-evaluation of patient's condition and review of old charts    ____________________________________________    CLINICAL IMPRESSION / ASSESSMENT AND PLAN / ED COURSE  Pertinent labs & imaging results that were available during my care of the patient were reviewed by me and considered in my medical decision making (see chart for details).    Patient presents with dehydration and hyponatremia, symptomatic, likely as a result of poor oral intake and recently starting on a diuretic blood pressure medication.  Her hypertension is also poorly controlled at present time.  She will need to discontinue the diuretic, I will give her a dose of clonidine for now to help with blood pressure control.  Plan to hospitalize for further IV fluids and correction of hyponatremia, especially since she lives at home alone and would be at risk of fall and injury in her present state.  ----------------------------------------- 9:23 PM on 07/05/2018 -----------------------------------------  Discussed with hospitalist.  We agree that with her persistently high blood pressures, not responding to clonidine at this time, will need to start her on a nicardipine drip to titrate her blood  pressure down and avoid cardiac failure or renal failure until she can be transitioned to appropriate oral medications for long-term control.      ____________________________________________   FINAL CLINICAL IMPRESSION(S) / ED DIAGNOSES    Final diagnoses:  Hypertensive urgency  Hyponatremia  Generalized weakness     ED Discharge Orders  None      Portions of this note were generated with dragon dictation software. Dictation errors may occur despite best attempts at proofreading.   Sharman CheekStafford, Reda Citron, MD 07/05/18 2124

## 2018-07-05 NOTE — Telephone Encounter (Signed)
Returned call to pt's daughter, [name].  Reported concern that blood pressure medication is causing low potassium in pt. Stated she started on Maxzide on 2/3, and has had some increased confusion, nausea, weakness, and dizziness.  Stated pt. does have confusion as her baseline, and it has increased.  Daughter feels her mothers symptoms are somewhat worse than when she was eval. in office on Tuesday, 2/11.  Reported pt. refused to have lab work drawn on Tuesday.  Stated she doesn't drink fluids very well; only takes sips at a time.  Reported she is urinating okay.  Recommended she needs to be reevaluated.  Advised of need to take to ER if her symptoms have worsened.  Reported she will discuss this with her mother, and see if she is willing to go.  Stated "if she feels bad enough, she will go to the ER."  Requested an appt. On Monday; appt. Given for 07/08/18 @ 1:40 PM with Nurse Practitioner.   Care advice given per protocol.  Strongly encouraged to take to ER for evaluation, if pt. Is willing.  Daughter verb. Understanding.        Reason for Disposition . [1] MODERATE weakness (i.e., interferes with work, school, normal activities) AND [2] persists > 3 days  Answer Assessment - Initial Assessment Questions 1. DESCRIPTION: "Describe how you are feeling."     C/o nausea, dizziness, and weakness; daughter noted confusion  2. SEVERITY: "How bad is it?"  "Can you stand and walk?"   - MILD - Feels weak or tired, but does not interfere with work, school or normal activities   - MODERATE - Able to stand and walk; weakness interferes with work, school, or normal activities   - SEVERE - Unable to stand or walk     moderate 3. ONSET:  "When did the weakness begin?"     Increased weakness over past several days 4. CAUSE: "What do you think is causing the weakness?"     Thinks it is the Maxzide 5. MEDICINES: "Have you recently started a new medicine or had a change in the amount of a medicine?"     Started on  Maxzide on 06/24/18 6. OTHER SYMPTOMS: "Do you have any other symptoms?" (e.g., chest pain, fever, cough, SOB, vomiting, diarrhea, bleeding, other areas of pain)    Daughter feels pt. Doesn't drink very well; urinating okay  7. PREGNANCY: "Is there any chance you are pregnant?" "When was your last menstrual period?"     N/a  Protocols used: WEAKNESS (GENERALIZED) AND FATIGUE-A-AH  Message from [place] sent at 07/05/2018 2:51 PM EST   Pt daughter [name] stated pt is experiencing signs of lack of potassium. [name] would like to discuss whether pt should stop taking the triamterene-hydrochlorothiazide (MAXZIDE-25) 37.5-25 MG tablet as pt is experiencing nausea, dizziness, and fatigue in the legs. Linda requests call back. Cb# [phone]

## 2018-07-05 NOTE — Telephone Encounter (Signed)
I called and spoke to daughter as soon as I saw the note She is nauseated and dizzy, she is not drinking enough fluids Tiny juice glass, an ensure, small glass of water, even though she was instructed to drink plenty, daughter says that's probably all she is drinking; daughter days potassium levels may be off She needs to go now to the ER I explained Explained concern for renal failure, electrolyte abnormality, importance of getting her to ER right now; daughter will leave work and get her and take her to ER I called and gave check-out to the ER staff, explained my concerns about dehydration and possible renal failure

## 2018-07-05 NOTE — ED Triage Notes (Signed)
Pt to ED with c/o of nausea and dizziness. Pt recently prescribed Triamterene-HCTZ. Pt hypertensive in triage 227/51. PCP has concerns for dehydration as well.

## 2018-07-06 ENCOUNTER — Inpatient Hospital Stay: Payer: Medicare Other

## 2018-07-06 LAB — URINALYSIS, COMPLETE (UACMP) WITH MICROSCOPIC
Bilirubin Urine: NEGATIVE
Glucose, UA: NEGATIVE mg/dL
Hgb urine dipstick: NEGATIVE
Ketones, ur: NEGATIVE mg/dL
Nitrite: NEGATIVE
Protein, ur: NEGATIVE mg/dL
Specific Gravity, Urine: 1.004 — ABNORMAL LOW (ref 1.005–1.030)
Squamous Epithelial / HPF: NONE SEEN (ref 0–5)
pH: 7 (ref 5.0–8.0)

## 2018-07-06 LAB — BASIC METABOLIC PANEL
Anion gap: 7 (ref 5–15)
BUN: 9 mg/dL (ref 8–23)
CO2: 30 mmol/L (ref 22–32)
Calcium: 8.8 mg/dL — ABNORMAL LOW (ref 8.9–10.3)
Chloride: 89 mmol/L — ABNORMAL LOW (ref 98–111)
Creatinine, Ser: 0.52 mg/dL (ref 0.44–1.00)
GFR calc Af Amer: >60 mL/min (ref >60)
GFR calc non Af Amer: >60 mL/min (ref >60)
Glucose, Bld: 101 mg/dL — ABNORMAL HIGH (ref 70–99)
Potassium: 3.1 mmol/L — ABNORMAL LOW (ref 3.5–5.1)
Sodium: 126 mmol/L — ABNORMAL LOW (ref 135–145)

## 2018-07-06 LAB — TSH: TSH: 1.49 u[IU]/mL (ref 0.350–4.500)

## 2018-07-06 MED ORDER — POTASSIUM CHLORIDE CRYS ER 20 MEQ PO TBCR
40.0000 meq | EXTENDED_RELEASE_TABLET | Freq: Once | ORAL | Status: DC
  Filled 2018-07-06: qty 2

## 2018-07-06 MED ORDER — LORAZEPAM 2 MG/ML IJ SOLN
0.5000 mg | Freq: Once | INTRAMUSCULAR | Status: AC
  Administered 2018-07-06: 0.5 mg via INTRAVENOUS
  Filled 2018-07-06: qty 1

## 2018-07-06 MED ORDER — POTASSIUM CHLORIDE IN NACL 20-0.9 MEQ/L-% IV SOLN
INTRAVENOUS | Status: DC
  Administered 2018-07-06: 13:00:00 via INTRAVENOUS
  Filled 2018-07-06 (×3): qty 1000

## 2018-07-06 MED ORDER — AMLODIPINE BESYLATE 5 MG PO TABS
5.0000 mg | ORAL_TABLET | Freq: Every day | ORAL | Status: DC
  Administered 2018-07-06 – 2018-07-08 (×3): 5 mg via ORAL
  Filled 2018-07-06 (×3): qty 1

## 2018-07-06 MED ORDER — HYDRALAZINE HCL 20 MG/ML IJ SOLN
5.0000 mg | Freq: Four times a day (QID) | INTRAMUSCULAR | Status: DC | PRN
  Administered 2018-07-06: 5 mg via INTRAVENOUS
  Filled 2018-07-06: qty 1

## 2018-07-06 MED ORDER — LORAZEPAM 0.5 MG PO TABS: 0.2500 mg | ORAL_TABLET | Freq: Every day | ORAL | Status: DC | PRN

## 2018-07-06 MED ORDER — HYDRALAZINE HCL 25 MG PO TABS
25.0000 mg | ORAL_TABLET | Freq: Two times a day (BID) | ORAL | Status: DC | PRN
  Administered 2018-07-06: 25 mg via ORAL
  Filled 2018-07-06: qty 1

## 2018-07-06 NOTE — Evaluation (Signed)
Clinical/Bedside Swallow Evaluation Patient Details  Name: Tamara Sparks MRN: [id number] Date of Birth: 03-Apr-1929  Today's Date: 07/06/2018 Time: SLP Start Time (ACUTE ONLY): 1715 SLP Stop Time (ACUTE ONLY): 1745 SLP Time Calculation (min) (ACUTE ONLY): 30 min  Past Medical History:  Past Medical History:  Diagnosis Date  . Anxiety state, unspecified   . Cardiac dysrhythmia, unspecified   . Depression   . Dyslipidemia, goal LDL below 130   . Dyspnea    DOE, patient does alot of work for living alone  . Dysrhythmia    patient unsure of this  . Heart murmur   . History of stroke    Right cerebellar intracerebral hemorrhage  . HOH (hard of hearing)   . Hyposmolality and/or hyponatremia   . Stroke [facility]) 2012   right hand shakes and can't write well  . Tremors of nervous system    RIGHT ARM  . Tuberculosis 1947   RIGHT UPPER LOBE EXCISION AGE 71  . Unspecified essential hypertension   . Unspecified vitamin D deficiency   . Urinary tract infection, site not specified    Past Surgical History:  Past Surgical History:  Procedure Laterality Date  . CARDIAC CATHETERIZATION     MC  . CATARACT EXTRACTION W/PHACO Right 03/08/2016   Procedure: CATARACT EXTRACTION PHACO AND INTRAOCULAR LENS PLACEMENT (IOC);  Surgeon: Sallee Lange, MD;  Location: [facility] ORS;  Service: Ophthalmology;  Laterality: Right;   FLUID CASSETTE [id number] H, EXP 07/19/17 [place]    3:16.8   AP%   27.2 CDE 94.14  . EXTERNAL EAR SURGERY Left   . LOBECTOMY Right 1952   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17   HPI:      Assessment / Plan / Recommendation Clinical Impression  pt was seen in evening due to nsg concerns of choking during medication administration in the am. pt has minimal dentition and has broken teeth with only few teeth remaining. pt did not have dentures present. pt was able to intake thin via cup with no overt ssx aspiration noted. pt did have some slight wet vocal quality and prolonged swallow  initiaion with thin via straw.  pt was able to intae puree, mechanical soft and regular solids, however regular solids had lengthy mastication and lengthy a to p transit timedue to poor dentition.SLP recommends dys 3 with thin no straw andx to administer meds in puree at this time. SLP will follow up in 2-3 days for diet toleration. Family and nsg educated and gave agreement and understanding. SLP Visit Diagnosis: Dysphagia, oral phase (R13.11)    Aspiration Risk  Mild aspiration risk    Diet Recommendation Dysphagia 3 (Mech soft);Thin liquid   Liquid Administration via: No straw;Cup Medication Administration: Crushed with puree Supervision: Patient able to self feed Compensations: Slow rate;Small sips/bites;Follow solids with liquid Postural Changes: Seated upright at 90 degrees    Other  Recommendations Oral Care Recommendations: Oral care BID   Follow up Recommendations        Frequency and Duration min 2x/week  2 weeks       Prognosis Prognosis for Safe Diet Advancement: Good      Swallow Study   General Date of Onset: 07/06/18 Type of Study: Bedside Swallow Evaluation Diet Prior to this Study: NPO Temperature Spikes Noted: No Respiratory Status: Room air History of Recent Intubation: No Behavior/Cognition: Alert;Cooperative;Pleasant mood Oral Cavity Assessment: Within Functional Limits Oral Care Completed by SLP: No Oral Cavity - Dentition: Missing dentition Vision: Functional for self-feeding Self-Feeding  Abilities: Able to feed self Patient Positioning: Upright in bed Baseline Vocal Quality: Normal Volitional Cough: Strong Volitional Swallow: Able to elicit    Oral/Motor/Sensory Function Overall Oral Motor/Sensory Function: Within functional limits   Ice Chips Ice chips: Within functional limits Presentation: Spoon   Thin Liquid Thin Liquid: Within functional limits Presentation: Spoon;Straw;Cup Other Comments: pt had increased wet vocal quality with thin  liquids via straw.     Nectar Thick Nectar Thick Liquid: Not tested   Honey Thick Honey Thick Liquid: Not tested   Puree Puree: Within functional limits Presentation: Spoon   Solid     Solid: Within functional limits Presentation: Melodye Ped Sauber 07/06/2018,5:48 PM

## 2018-07-06 NOTE — Plan of Care (Signed)
  Problem: Education: Goal: Knowledge of General Education information will improve Description Including pain rating scale, medication(s)/side effects and non-pharmacologic comfort measures 07/06/2018 0516 by Myles Gip, RN Outcome: Progressing 07/06/2018 0500 by Myles Gip, RN Outcome: Progressing   Problem: Safety: Goal: Ability to remain free from injury will improve 07/06/2018 0516 by Myles Gip, RN Outcome: Progressing 07/06/2018 0500 by Myles Gip, RN Outcome: Progressing

## 2018-07-06 NOTE — Progress Notes (Addendum)
Patient ID: Tamara Sparks, female   DOB: 01-28-1929, 85 y.o.   MRN: [id number]  [employer] PROGRESS NOTE  Tamara Sparks ENI:[id number] DOB: 13-Jun-1928 DOA: 07/05/2018 PCP: Kerman Passey, MD  HPI/Subjective: Patient feeling very weak nauseous dizzy and fatigued.  Daughter at the bedside.  Nursing staff concerned about swallowing and put in for speech therapy to see her.  Daughter states things went downhill when she was started on new medication Maxide  Objective: Vitals:   07/06/18 1000 07/06/18 1119  BP: (!) 191/65 (!) 145/56  Pulse:    Resp:    Temp:    SpO2:      Filed Weights   07/05/18 2337  Weight: 43.6 kg    ROS: Review of Systems  Constitutional: Negative for chills and fever.  Eyes: Negative for blurred vision.  Respiratory: Negative for cough and shortness of breath.   Cardiovascular: Negative for chest pain.  Gastrointestinal: Positive for nausea and vomiting. Negative for abdominal pain, constipation and diarrhea.  Genitourinary: Negative for dysuria.  Musculoskeletal: Negative for joint pain.  Neurological: Negative for dizziness and headaches.   Exam: Physical Exam  HENT:  Nose: No mucosal edema.  Mouth/Throat: No oropharyngeal exudate or posterior oropharyngeal edema.  Eyes: Pupils are equal, round, and reactive to light. Conjunctivae, EOM and lids are normal.  Neck: No JVD present. Carotid bruit is not present. No edema present. No thyroid mass and no thyromegaly present.  Cardiovascular: S1 normal and S2 normal. Exam reveals no gallop.  No murmur heard. Pulses:      Dorsalis pedis pulses are 2+ on the right side and 2+ on the left side.  Respiratory: No respiratory distress. She has no wheezes. She has no rhonchi. She has no rales.  GI: Soft. Bowel sounds are normal. There is no abdominal tenderness.  Musculoskeletal:     Right ankle: She exhibits no swelling.     Left ankle: She exhibits no swelling.  Lymphadenopathy:    She has no  cervical adenopathy.  Neurological: She is alert. No cranial nerve deficit.  Skin: Skin is warm. No rash noted. Nails show no clubbing.  Psychiatric: She has a normal mood and affect.      Data Reviewed: Basic Metabolic Panel: Recent Labs  Lab 07/05/18 1816 07/06/18 0327  NA 121* 126*  K 3.3* 3.1*  CL 81* 89*  CO2 31 30  GLUCOSE 139* 101*  BUN 10 9  CREATININE 0.68 0.52  CALCIUM 9.5 8.8*  MG 2.1  --   PHOS 2.9  --    Liver Function Tests: Recent Labs  Lab 07/05/18 1816  AST 25  ALT 14  ALKPHOS 69  BILITOT 1.2  PROT 7.4  ALBUMIN 4.3   Recent Labs  Lab 07/05/18 1816  LIPASE 63*   CBC: Recent Labs  Lab 07/05/18 1816  WBC 9.7  HGB 14.4  HCT 39.7  MCV 82.9  PLT 409*   Cardiac Enzymes: Recent Labs  Lab 07/05/18 1816  TROPONINI <0.03   BNP (last 3 results) Recent Labs    07/05/18 1816  BNP 85.0      Studies: Ct Head Wo Contrast  Result Date: 07/06/2018 CLINICAL DATA:  Generalized weakness, nausea, vomiting, and dizziness. EXAM: CT HEAD WITHOUT CONTRAST TECHNIQUE: Contiguous axial images were obtained from the base of the skull through the vertex without intravenous contrast. COMPARISON:  12/29/2017 FINDINGS: Brain: There is no evidence of acute infarct, intracranial hemorrhage, mass, midline shift, or extra-axial fluid collection. The ventricles  and sulci are within normal limits for age. Small chronic infarcts are again seen in the right lentiform nucleus and right cerebellum. Patchy cerebral white matter hypodensities bilaterally are similar to the prior study and nonspecific but compatible with chronic small vessel ischemic disease, mild for age. Vascular: No hyperdense vessel. Skull: No fracture or suspicious osseous lesion. Sinuses/Orbits: Visualized paranasal sinuses are clear. Prior left mastoidectomy is again noted with unchanged opacification of residual mastoid air cells. Right cataract extraction. Other: None. IMPRESSION: 1. No evidence of  acute intracranial abnormality. 2. Mild chronic small vessel ischemic disease. Electronically Signed   By: Sebastian Ache M.D.   On: 07/06/2018 13:27    Scheduled Meds: . amLODipine  5 mg Oral Daily  . aspirin EC  81 mg Oral Daily  . enoxaparin (LOVENOX) injection  30 mg Subcutaneous Q24H  . escitalopram  5 mg Oral Daily  . metoprolol succinate  50 mg Oral Daily  . simvastatin  20 mg Oral q1800  . sodium chloride flush  3 mL Intravenous Once   Continuous Infusions: . 0.9 % NaCl with KCl 20 mEq / L 50 mL/hr at 07/06/18 1258  . sodium chloride Stopped (07/05/18 2002)    Assessment/Plan:  1. Hyponatremia.  This has improved a little bit with IV fluids.  Likely secondary to hydrochlorothiazide.  I was hoping to stop the fluids today but since she was made n.p.o. I will have to keep the fluids going. 2. Hypokalemia.  Add potassium to the IV fluids. 3. Accelerated hypertension.  Continue metoprolol and Norvasc.  PRN IV hydralazine if unable to swallow. 4. Nausea and difficulty swallowing.  CT scan of the head negative for acute abnormalities.  If still having symptoms tomorrow we will consider an MRI of the brain. 5. Hyperlipidemia unspecified on simvastatin 6. Depression on Lexapro 7. History of stroke in the past on aspirin 8. History of anxiety.  They are trying to taper off the Ativan.  Code Status:     Code Status Orders  (From admission, onward)         Start     Ordered   07/05/18 2334  Full code  Continuous     07/05/18 2333        Code Status History    This patient has a current code status but no historical code status.     Family Communication: Daughter at the bedside Disposition Plan: To be determined based on clinical course  Consultants:  Speech therapy  Time spent: 28 minutes  Angelika Jerrett [employer]

## 2018-07-06 NOTE — Progress Notes (Addendum)
CCMD called and reported that pt HR went down to 49 and went right back up to 70. Pt asymtoamtic. VSS except BP 181/70. Notify Dr. Allena Katz. Will continue to monitor.  Update 0412: Dr. Allena Katz ordered Hydralazine 25 mg oral twice a needed for BP > 170/90. Will continue to monitor.  Update 0623: Pt BP at 148/56 after administration of 25 mg hydralazine. Will continue to monitor

## 2018-07-06 NOTE — Plan of Care (Signed)
  Problem: Education: Goal: Knowledge of General Education information will improve Description: Including pain rating scale, medication(s)/side effects and non-pharmacologic comfort measures Outcome: Progressing   Problem: Safety: Goal: Ability to remain free from injury will improve Outcome: Progressing   

## 2018-07-06 NOTE — H&P (Signed)
[employer] - [place] [facility]   PATIENT NAME: Tamara Sparks    MR#:  [id number]  DATE OF BIRTH:  20-Apr-1929  DATE OF ADMISSION:  07/05/2018  PRIMARY CARE PHYSICIAN: Kerman Passey, MD   REQUESTING/REFERRING PHYSICIAN: Sharman Cheek, MD  CHIEF COMPLAINT:   Chief Complaint  Patient presents with  . Dizziness  . Nausea    HISTORY OF PRESENT ILLNESS:  Tamara Sparks  is a 85 y.o. female with a known history of HTN p/w generalized weakness, lightheadedness, nausea. Pt HOH, majority of Hx from daughter Tamara Sparks at bedside. Pt makes her own medical decisions, and is of sound mind. BP meds changed ~10d ago. Developed nausea w/o vomiting, lightheadedness, gen weakness Sun/Mon 2/9-2/10. Saw PCP 2/11, pt confused/agitated, unable to draw blood. BP high, but is "always high", sent home. Progressively worsening weakness, fatigue/malaise, muscle cramps. Stayed in bed. Daughter brought pt to ED. Na+ 121, dehydrated. BP 227/64.  PAST MEDICAL HISTORY:   Past Medical History:  Diagnosis Date  . Anxiety state, unspecified   . Cardiac dysrhythmia, unspecified   . Depression   . Dyslipidemia, goal LDL below 130   . Dyspnea    DOE, patient does alot of work for living alone  . Dysrhythmia    patient unsure of this  . Heart murmur   . History of stroke    Right cerebellar intracerebral hemorrhage  . HOH (hard of hearing)   . Hyposmolality and/or hyponatremia   . Stroke [facility]) 2012   right hand shakes and can't write well  . Tremors of nervous system    RIGHT ARM  . Tuberculosis 1947   RIGHT UPPER LOBE EXCISION AGE 31  . Unspecified essential hypertension   . Unspecified vitamin D deficiency   . Urinary tract infection, site not specified     PAST SURGICAL HISTORY:   Past Surgical History:  Procedure Laterality Date  . CARDIAC CATHETERIZATION     MC  . CATARACT EXTRACTION W/PHACO Right 03/08/2016   Procedure: CATARACT EXTRACTION PHACO AND INTRAOCULAR  LENS PLACEMENT (IOC);  Surgeon: Sallee Lange, MD;  Location: [facility] ORS;  Service: Ophthalmology;  Laterality: Right;   FLUID CASSETTE [id number] H, EXP 07/19/17 [place]    3:16.8   AP%   27.2 CDE 94.14  . EXTERNAL EAR SURGERY Left   . LOBECTOMY Right 1952   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17    SOCIAL HISTORY:   Social History   Tobacco Use  . Smoking status: Never Smoker  . Smokeless tobacco: Never Used  . Tobacco comment: smoking cessation materials not required  Substance Use Topics  . Alcohol use: No    Alcohol/week: 0.0 standard drinks    FAMILY HISTORY:   Family History  Problem Relation Age of Onset  . Stroke Mother   . Hypertension Father   . Stroke Father   . Hypertension Sister   . Hypertension Brother   . Cancer Brother        brain  . Hypertension Sister   . Hypertension Brother   . Cancer Brother        stomach    DRUG ALLERGIES:   Allergies  Allergen Reactions  . Sertraline   . Penicillins Rash    Has patient had a PCN reaction causing immediate rash, facial/tongue/throat swelling, SOB or lightheadedness with hypotension: Yes Has patient had a PCN reaction causing severe rash involving mucus membranes or skin necrosis: No Has patient had a PCN reaction that required hospitalization:  No Has patient had a PCN reaction occurring within the last 10 years: No If all of the above answers are "NO", then may proceed with Cephalosporin use.  . Sulfa Antibiotics Other (See Comments)    Pt does not remember but thinks that it just didn't agree with her    REVIEW OF SYSTEMS:   Review of Systems  Constitutional: Positive for malaise/fatigue. Negative for chills, diaphoresis, fever and weight loss.  HENT: Positive for hearing loss. Negative for congestion, ear pain, nosebleeds, sinus pain, sore throat and tinnitus.   Eyes: Negative for blurred vision, double vision and photophobia.  Respiratory: Negative for cough, hemoptysis, sputum production, shortness of  breath and wheezing.   Cardiovascular: Negative for chest pain, palpitations, orthopnea, claudication, leg swelling and PND.  Gastrointestinal: Positive for nausea. Negative for abdominal pain, blood in stool, constipation, diarrhea, heartburn, melena and vomiting.  Genitourinary: Negative for dysuria, frequency, hematuria and urgency.  Musculoskeletal: Positive for myalgias. Negative for back pain, joint pain and neck pain.  Skin: Negative for itching and rash.  Neurological: Positive for dizziness and weakness. Negative for tingling, tremors, sensory change, speech change, focal weakness, seizures, loss of consciousness and headaches.  Psychiatric/Behavioral: Negative for depression and memory loss. The patient is not nervous/anxious and does not have insomnia.    MEDICATIONS AT HOME:   Prior to Admission medications   Medication Sig Start Date End Date Taking? Authorizing Provider  escitalopram (LEXAPRO) 5 MG tablet Take 1 tablet (5 mg total) by mouth daily. 07/02/18  Yes Lada, Janit Bern, MD  LORazepam (ATIVAN) 0.5 MG tablet Take 1 tablet (0.5 mg total) by mouth daily as needed for anxiety. 04/22/18  Yes Jomarie Longs, MD  metoprolol succinate (TOPROL-XL) 50 MG 24 hr tablet Take 1 tablet (50 mg total) by mouth daily. Take with or immediately following a meal. 04/02/18  Yes Lada, Janit Bern, MD  simvastatin (ZOCOR) 20 MG tablet TAKE 1 TABLET(20 MG) BY MOUTH DAILY AT 6 PM FOR CHOLESTEROL 05/17/18  Yes Lada, Janit Bern, MD  triamterene-hydrochlorothiazide (MAXZIDE-25) 37.5-25 MG tablet Take 0.5 tablets by mouth daily. Hydrate really well 06/24/18  Yes Lada, Janit Bern, MD  aspirin 81 MG tablet Take 81 mg by mouth daily.    [provider]  Cholecalciferol (VITAMIN D3) 5000 UNITS CAPS Take 1 capsule by mouth daily.     [provider]  cloNIDine (CATAPRES) 0.1 MG tablet TAKE 1 TABLET BY MOUTH 2 (TWO) TIMES DAILY. DO NOT TAKE IF TOP BLOOD PRESSURE IS LESS THAN 145 Patient not taking:  Reported on 07/02/2018 06/17/18   Kerman Passey, MD      VITAL SIGNS:  Blood pressure (!) 155/58, pulse 73, temperature (!) 97.5 F (36.4 C), temperature source Oral, resp. rate 16, height 5' (1.524 m), weight 43.6 kg, SpO2 98 %.  PHYSICAL EXAMINATION:  Physical Exam Constitutional:      General: She is not in acute distress.    Appearance: Normal appearance. She is ill-appearing. She is not toxic-appearing or diaphoretic.  HENT:     Head: Atraumatic.     Mouth/Throat:     Pharynx: Oropharynx is clear.  Eyes:     General: No scleral icterus.    Extraocular Movements: Extraocular movements intact.     Conjunctiva/sclera: Conjunctivae normal.  Neck:     Musculoskeletal: Neck supple.  Cardiovascular:     Rate and Rhythm: Normal rate and regular rhythm.     Heart sounds: Normal heart sounds. No murmur. No friction rub.  No gallop.   Pulmonary:     Effort: No respiratory distress.     Breath sounds: Normal breath sounds. No stridor. No wheezing, rhonchi or rales.  Abdominal:     General: Bowel sounds are normal. There is no distension.     Palpations: Abdomen is soft.     Tenderness: There is no abdominal tenderness. There is no guarding or rebound.  Musculoskeletal: Normal range of motion.        General: No swelling or tenderness.  Lymphadenopathy:     Cervical: No cervical adenopathy.  Skin:    General: Skin is warm and dry.     Coloration: Skin is pale.     Findings: No erythema or rash.  Neurological:     Mental Status: She is alert and oriented to person, place, and time. Mental status is at baseline.  Psychiatric:        Mood and Affect: Mood normal.        Behavior: Behavior normal.        Thought Content: Thought content normal.        Judgment: Judgment normal.    LABORATORY PANEL:   CBC Recent Labs  Lab 07/05/18 1816  WBC 9.7  HGB 14.4  HCT 39.7  PLT 409*    ------------------------------------------------------------------------------------------------------------------  Chemistries  Recent Labs  Lab 07/05/18 1816  NA 121*  K 3.3*  CL 81*  CO2 31  GLUCOSE 139*  BUN 10  CREATININE 0.68  CALCIUM 9.5  MG 2.1  AST 25  ALT 14  ALKPHOS 69  BILITOT 1.2   ------------------------------------------------------------------------------------------------------------------  Cardiac Enzymes Recent Labs  Lab 07/05/18 1816  TROPONINI <0.03   ------------------------------------------------------------------------------------------------------------------  RADIOLOGY:  No results found.  IMPRESSION AND PLAN:   A/P: 24F generalized weakness, lightheadedness, nausea, dehydration, hypochloremic hyponatremia, hypokalemia, unctrl HTN. Hyperglycemia, lipase elevation. -Generalized weakness, lightheadedness, nausea, dehydration, hypochloremic hyponatremia, hypokalemia, unctrl HTN: Started on diuretics. Likely cause for presentation. Volume depleted. D/C diuretics. IVF. Replete and monitor electrolytes. Avoid nephrotoxins. On Cardene gtt for short time, responded well. Nifedipine may be a good option for longer-term BP ctrl. -Hyperglycemia, lipase elevation: 2/2 dehydration. IVF. -c/w other home meds/formulary subs as tolerated. -FEN/GI: Regular diet. -DVT PPx: Lovenox. -Code status: Full code. -Disposition: Admission, > 2 midnights.   All the records are reviewed and case discussed with ED provider. Management plans discussed with the patient, family and they are in agreement.  CODE STATUS: Full code.  TOTAL TIME TAKING CARE OF THIS PATIENT: 75 minutes.    Barbaraann RondoPrasanna Malarie Tappen M.D on 07/06/2018 at 1:52 AM  Between 7am to 6pm - Pager - [phone]  After 6pm go to [email or web address] - Social research officer, [email or web address] [email or web address]  [facility] [place] [facility]  Office  [phone]  CC: Primary care physician; Kerman PasseyLada, Melinda P, MD   Note: This  dictation was prepared with Dragon dictation along with smaller phrase technology. Any transcriptional errors that result from this process are unintentional.

## 2018-07-06 NOTE — Progress Notes (Signed)
PT Cancellation Note  Patient Details Name: Tamara Sparks MRN: [id number] DOB: [birth year]   Cancelled Treatment:    Reason Eval/Treat Not Completed: Medical issues which prohibited therapy Spoke with nursing about seeing pt, however her BP was 191/65 and nursing agrees to at least hold PT to this afternoon if not tomorrow.  Pt had issues with taking pills and they are trying to figure out getting her meds administered to address this.  Will try back later as appropriate.   Malachi Pro, DPT 07/06/2018, 11:03 AM

## 2018-07-07 ENCOUNTER — Other Ambulatory Visit: Payer: Self-pay

## 2018-07-07 LAB — BASIC METABOLIC PANEL WITH GFR
Anion gap: 5 (ref 5–15)
BUN: 10 mg/dL (ref 8–23)
CO2: 28 mmol/L (ref 22–32)
Calcium: 8.4 mg/dL — ABNORMAL LOW (ref 8.9–10.3)
Chloride: 93 mmol/L — ABNORMAL LOW (ref 98–111)
Creatinine, Ser: 0.74 mg/dL (ref 0.44–1.00)
GFR calc Af Amer: >60 mL/min
GFR calc non Af Amer: >60 mL/min
Glucose, Bld: 103 mg/dL — ABNORMAL HIGH (ref 70–99)
Potassium: 3.5 mmol/L (ref 3.5–5.1)
Sodium: 126 mmol/L — ABNORMAL LOW (ref 135–145)

## 2018-07-07 LAB — LIPID PANEL
Cholesterol: 113 mg/dL (ref 0–200)
HDL: 54 mg/dL
LDL Cholesterol: 50 mg/dL (ref 0–99)
Total CHOL/HDL Ratio: 2.1 ratio
Triglycerides: 45 mg/dL
VLDL: 9 mg/dL (ref 0–40)

## 2018-07-07 LAB — MAGNESIUM: Magnesium: 1.8 mg/dL (ref 1.7–2.4)

## 2018-07-07 MED ORDER — POTASSIUM CHLORIDE 20 MEQ PO PACK
20.0000 meq | PACK | Freq: Two times a day (BID) | ORAL | Status: DC
  Administered 2018-07-07 – 2018-07-08 (×3): 20 meq via ORAL
  Filled 2018-07-07 (×3): qty 1

## 2018-07-07 MED ORDER — MAGNESIUM SULFATE 2 GM/50ML IV SOLN
2.0000 g | Freq: Once | INTRAVENOUS | Status: AC
  Administered 2018-07-07: 2 g via INTRAVENOUS
  Filled 2018-07-07: qty 50

## 2018-07-07 NOTE — Evaluation (Signed)
Physical Therapy Evaluation Patient Details Name: Tamara Sparks MRN: [id number] DOB: [birth year] Today's Date: 07/07/2018   History of Present Illness  85 y/o female who had a change in BP meds ~2 weeks ago and has since had AMS, weakness.  Pt arrived with highly elevated BP.  Clinical Impression  Pt did well with PT, BP is still a little elevated (and Na+ still somewhat low), but cleared by nursing to work with PT. She did very well with PT quickly getting to EOB, showing functional, age appropriate strength, easily ambulating around the nurses station with stable vitals and generally impressing her daughters, and this PT, with how well she did.  Pt and family agree that she is at/near baseline and does not require further PT intervention.  Will complete PT orders.     Follow Up Recommendations No PT follow up    Equipment Recommendations  None recommended by PT    Recommendations for Other Services       Precautions / Restrictions Precautions Precautions: Fall Restrictions Weight Bearing Restrictions: No      Mobility  Bed Mobility Overal bed mobility: Independent                Transfers Overall transfer level: Independent Equipment used: Rolling walker (2 wheeled)             General transfer comment: Pt easily and confidently gets to standing w/o assist, not balance issues  Ambulation/Gait Ambulation/Gait assistance: Independent Gait Distance (Feet): 200 Feet Assistive device: Rolling walker (2 wheeled)       General Gait Details: Pt was able to ambulate with good speed and confidence, no LOBs or unsteadiness.  She did keep walker ahead of her a bit to far, with very little weight on it, but otherwise had no issues.    Stairs            Wheelchair Mobility    Modified Rankin (Stroke Patients Only)       Balance Overall balance assessment: Independent                                           Pertinent Vitals/Pain  Pain Assessment: No/denies pain    Home Living Family/patient expects to be discharged to:: Private residence Living Arrangements: Alone Available Help at Discharge: Available PRN/intermittently(daughters can provide near 24/7 assist initially)   Home Access: Stairs to enter Entrance Stairs-Rails: Right Entrance Stairs-Number of Steps: 2   Home Equipment: Walker - 2 wheels;Cane - single point      Prior Function Level of Independence: Independent         Comments: Pt was able to live independently with assist from daughters, does chores, did not need AD     Hand Dominance        Extremity/Trunk Assessment   Upper Extremity Assessment Upper Extremity Assessment: Generalized weakness(age appropriate limiations)    Lower Extremity Assessment Lower Extremity Assessment: Generalized weakness(age appropriate limitations)       Communication   Communication: (minimally talkative, but appropriate)  Cognition Arousal/Alertness: Awake/alert Behavior During Therapy: WFL for tasks assessed/performed Overall Cognitive Status: Within Functional Limits for tasks assessed                                        General Comments  Exercises     Assessment/Plan    PT Assessment Patent does not need any further PT services  PT Problem List         PT Treatment Interventions      PT Goals (Current goals can be found in the Care Plan section)  Acute Rehab PT Goals Patient Stated Goal: go home PT Goal Formulation: All assessment and education complete, DC therapy    Frequency     Barriers to discharge        Co-evaluation               AM-PAC PT "6 Clicks" Mobility  Outcome Measure Help needed turning from your back to your side while in a flat bed without using bedrails?: None Help needed moving from lying on your back to sitting on the side of a flat bed without using bedrails?: None Help needed moving to and from a bed to a chair  (including a wheelchair)?: None Help needed standing up from a chair using your arms (e.g., wheelchair or bedside chair)?: None Help needed to walk in hospital room?: None Help needed climbing 3-5 steps with a railing? : None 6 Click Score: 24    End of Session Equipment Utilized During Treatment: Gait belt Activity Tolerance: Patient tolerated treatment well Patient left: with chair alarm set;with call bell/phone within reach;with family/visitor present Nurse Communication: Mobility status PT Visit Diagnosis: Muscle weakness (generalized) (M62.81);Difficulty in walking, not elsewhere classified (R26.2)    Time: 4174-0814 PT Time Calculation (min) (ACUTE ONLY): 21 min   Charges:   PT Evaluation $PT Eval Low Complexity: 1 Low          Malachi Pro , DPT 07/07/2018, 7:36 PM

## 2018-07-07 NOTE — Progress Notes (Signed)
Patient ID: Tamara Sparks, female   DOB: 1928-09-26, 85 y.o.   MRN: [id number]  [facility] PROGRESS NOTE  YANISSA MABILE BTD:[id number] DOB: 08-04-1928 DOA: 07/05/2018 PCP: Kerman Passey, MD  HPI/Subjective: Patient feeling a little bit better today.  Was eating breakfast.  Some nausea but no vomiting.  No abdominal pain.  Still feels a little dizzy and weak.  Objective: Vitals:   07/07/18 0810 07/07/18 1042  BP: (!) 166/58 (!) 147/52  Pulse: 75 69  Resp: 18 18  Temp: 98.1 F (36.7 C)   SpO2: 96% 96%    Filed Weights   07/05/18 2337 07/07/18 0428  Weight: 43.6 kg 44 kg    ROS: Review of Systems  Constitutional: Negative for chills and fever.  Eyes: Negative for blurred vision.  Respiratory: Negative for cough and shortness of breath.   Cardiovascular: Negative for chest pain.  Gastrointestinal: Positive for nausea and vomiting. Negative for abdominal pain, constipation and diarrhea.  Genitourinary: Negative for dysuria.  Musculoskeletal: Negative for joint pain.  Neurological: Negative for dizziness and headaches.   Exam: Physical Exam  HENT:  Nose: No mucosal edema.  Mouth/Throat: No oropharyngeal exudate or posterior oropharyngeal edema.  Eyes: Pupils are equal, round, and reactive to light. Conjunctivae, EOM and lids are normal.  Neck: No JVD present. Carotid bruit is not present. No edema present. No thyroid mass and no thyromegaly present.  Cardiovascular: S1 normal and S2 normal. Exam reveals no gallop.  No murmur heard. Pulses:      Dorsalis pedis pulses are 2+ on the right side and 2+ on the left side.  Respiratory: No respiratory distress. She has no wheezes. She has no rhonchi. She has no rales.  GI: Soft. Bowel sounds are normal. There is no abdominal tenderness.  Musculoskeletal:     Right ankle: She exhibits no swelling.     Left ankle: She exhibits no swelling.  Lymphadenopathy:    She has no cervical adenopathy.  Neurological: She is  alert. No cranial nerve deficit.  Skin: Skin is warm. No rash noted. Nails show no clubbing.  Psychiatric: She has a normal mood and affect.      Data Reviewed: Basic Metabolic Panel: Recent Labs  Lab 07/05/18 1816 07/06/18 0327 07/07/18 0445  NA 121* 126* 126*  K 3.3* 3.1* 3.5  CL 81* 89* 93*  CO2 31 30 28   GLUCOSE 139* 101* 103*  BUN 10 9 10   CREATININE 0.68 0.52 0.74  CALCIUM 9.5 8.8* 8.4*  MG 2.1  --  1.8  PHOS 2.9  --   --    Liver Function Tests: Recent Labs  Lab 07/05/18 1816  AST 25  ALT 14  ALKPHOS 69  BILITOT 1.2  PROT 7.4  ALBUMIN 4.3   Recent Labs  Lab 07/05/18 1816  LIPASE 63*   CBC: Recent Labs  Lab 07/05/18 1816  WBC 9.7  HGB 14.4  HCT 39.7  MCV 82.9  PLT 409*   Cardiac Enzymes: Recent Labs  Lab 07/05/18 1816  TROPONINI <0.03   BNP (last 3 results) Recent Labs    07/05/18 1816  BNP 85.0      Studies: Ct Head Wo Contrast  Result Date: 07/06/2018 CLINICAL DATA:  Generalized weakness, nausea, vomiting, and dizziness. EXAM: CT HEAD WITHOUT CONTRAST TECHNIQUE: Contiguous axial images were obtained from the base of the skull through the vertex without intravenous contrast. COMPARISON:  12/29/2017 FINDINGS: Brain: There is no evidence of acute infarct, intracranial hemorrhage, mass,  midline shift, or extra-axial fluid collection. The ventricles and sulci are within normal limits for age. Small chronic infarcts are again seen in the right lentiform nucleus and right cerebellum. Patchy cerebral white matter hypodensities bilaterally are similar to the prior study and nonspecific but compatible with chronic small vessel ischemic disease, mild for age. Vascular: No hyperdense vessel. Skull: No fracture or suspicious osseous lesion. Sinuses/Orbits: Visualized paranasal sinuses are clear. Prior left mastoidectomy is again noted with unchanged opacification of residual mastoid air cells. Right cataract extraction. Other: None. IMPRESSION: 1. No  evidence of acute intracranial abnormality. 2. Mild chronic small vessel ischemic disease. Electronically Signed   By: Sebastian Ache M.D.   On: 07/06/2018 13:27    Scheduled Meds: . amLODipine  5 mg Oral Daily  . aspirin EC  81 mg Oral Daily  . enoxaparin (LOVENOX) injection  30 mg Subcutaneous Q24H  . escitalopram  5 mg Oral Daily  . metoprolol succinate  50 mg Oral Daily  . potassium chloride  20 mEq Oral BID  . simvastatin  20 mg Oral q1800  . sodium chloride flush  3 mL Intravenous Once   Continuous Infusions: . sodium chloride Stopped (07/05/18 2002)    Assessment/Plan:  1. Hyponatremia.  Likely secondary to hydrochlorothiazide.  Stop IV fluids.  Recheck sodium again tomorrow. 2. Hypokalemia.  Try K-Lor in applesauce. 3. Hypomagnesemia replace IV today. 4. Accelerated hypertension on presentation.  Now improved.  Continue metoprolol and Norvasc.  Blood pressure better today. 5. Nausea and difficulty swallowing.  CT scan of the head negative for acute abnormalities.  Patient placed on diet last night and doing better with the diet. 6. Hyperlipidemia unspecified on simvastatin 7. Depression on Lexapro 8. History of stroke in the past on aspirin 9. History of anxiety.  They are trying to taper off the Ativan.  10. Weakness.  Physical therapy evaluation  Code Status:     Code Status Orders  (From admission, onward)         Start     Ordered   07/05/18 2334  Full code  Continuous     07/05/18 2333        Code Status History    This patient has a current code status but no historical code status.     Family Communication: Daughter at the bedside Disposition Plan: Sodium needs to be closer to the normal range prior to disposition.  Consultants:  Speech therapy  Time spent: 27 minutes  Adileny Delon [employer]

## 2018-07-08 ENCOUNTER — Telehealth: Payer: Self-pay

## 2018-07-08 ENCOUNTER — Ambulatory Visit: Payer: Self-pay | Admitting: Nurse Practitioner

## 2018-07-08 LAB — BASIC METABOLIC PANEL
ANION GAP: 6 (ref 5–15)
BUN: 9 mg/dL (ref 8–23)
CALCIUM: 8.5 mg/dL — AB (ref 8.9–10.3)
CO2: 28 mmol/L (ref 22–32)
Chloride: 96 mmol/L — ABNORMAL LOW (ref 98–111)
Creatinine, Ser: 0.62 mg/dL (ref 0.44–1.00)
GFR calc Af Amer: >60 mL/min (ref >60)
GFR calc non Af Amer: >60 mL/min (ref >60)
Glucose, Bld: 103 mg/dL — ABNORMAL HIGH (ref 70–99)
Potassium: 4.2 mmol/L (ref 3.5–5.1)
Sodium: 130 mmol/L — ABNORMAL LOW (ref 135–145)

## 2018-07-08 LAB — MAGNESIUM: Magnesium: 2.1 mg/dL (ref 1.7–2.4)

## 2018-07-08 LAB — LIPASE, BLOOD: Lipase: 54 U/L — ABNORMAL HIGH (ref 11–51)

## 2018-07-08 MED ORDER — AMLODIPINE BESYLATE 5 MG PO TABS: 5.0000 mg | ORAL_TABLET | Freq: Every day | ORAL | 0 refills | Status: DC

## 2018-07-08 NOTE — Telephone Encounter (Signed)
Lvm @ 4:30 asking pt to return call to schedule hospital follow up

## 2018-07-08 NOTE — Discharge Instructions (Signed)
Recommend follow blood test to check sodium in follow up appointment   Hyponatremia Hyponatremia is when the amount of salt (sodium) in your blood is too low. When sodium levels are low, your cells absorb extra water and they swell. The swelling happens throughout the body, but it mostly affects the brain. What are the causes? This condition may be caused by:  Heart, kidney, or liver problems.  Thyroid problems.  Adrenal gland problems.  Metabolic conditions, such as syndrome of inappropriate antidiuretic hormone (SIADH).  Severe vomiting and diarrhea.  Certain medicines or illegal drugs.  Dehydration.  Drinking too much water.  Eating a diet that is low in sodium.  Large burns on your body.  Sweating. What increases the risk? This condition is more likely to develop in people who:  Have long-term (chronic) kidney disease.  Have heart failure.  Have a medical condition that causes frequent or excessive diarrhea.  Have metabolic conditions, such as Addison disease or SIADH.  Take certain medicines that affect the sodium and fluid balance in the blood. Some of these medicine types include: ? Diuretics. ? NSAIDs. ? Some opioid pain medicines. ? Some antidepressants. ? Some seizure prevention medicines. What are the signs or symptoms? Symptoms of this condition include:  Nausea and vomiting.  Confusion.  Lethargy.  Agitation.  Headache.  Seizures.  Unconsciousness.  Appetite loss.  Muscle weakness and cramping.  Feeling weak or light-headed.  Having a rapid heart rate.  Fainting, in severe cases. How is this diagnosed? This condition is diagnosed with a medical history and physical exam. You will also have other tests, including:  Blood tests.  Urine tests. How is this treated? Treatment for this condition depends on the cause. Treatment may include:  Fluids given through an IV tube that is inserted into one of your veins.  Medicines to  correct the sodium imbalance. If medicines are causing the condition, the medicines will need to be adjusted.  Limiting water or fluid intake to get the correct sodium balance. Follow these instructions at home:  Take medicines only as directed by your health care provider. Many medicines can make this condition worse. Talk with your health care provider about any medicines that you are currently taking.  Carefully follow a recommended diet as directed by your health care provider.  Carefully follow instructions from your health care provider about fluid restrictions.  Keep all follow-up visits as directed by your health care provider. This is important.  Do not drink alcohol. Contact a health care provider if:  You develop worsening nausea, fatigue, headache, confusion, or weakness.  Your symptoms go away and then return.  You have problems following the recommended diet. Get help right away if:  You have a seizure.  You faint.  You have ongoing diarrhea or vomiting. This information is not intended to replace advice given to you by your health care provider. Make sure you discuss any questions you have with your health care provider. Document Released: 04/28/2002 Document Revised: 10/14/2015 Document Reviewed: 05/28/2014 Elsevier Interactive Patient Education  2019 [employer].

## 2018-07-08 NOTE — Telephone Encounter (Signed)
Copied from CRM [id number]. Topic: Appointment Scheduling - Scheduling Inquiry for Clinic >> Jul 08, 2018 10:31 AM Leafy Ro wrote: Reason for CRM: Alinda [facility] is calling and pt needs post hospital follow with in 5 day. Pt will be discharge today. Please call pt >> Jul 08, 2018 10:48 AM Limmie Patricia P wrote: Will you help me figure out where to put this hospital follow up. Dr lada is booked

## 2018-07-08 NOTE — Telephone Encounter (Signed)
I only see one opening for wed at 1:20 we can do that or she will need to see elizabeth

## 2018-07-08 NOTE — Discharge Summary (Signed)
[facility] - [facility]   PATIENT NAME: Tamara Sparks    MR#:  [id number]  DATE OF BIRTH:  Sep 18, 1928  DATE OF ADMISSION:  07/05/2018 ADMITTING PHYSICIAN: Barbaraann RondoPrasanna Sridharan, MD  DATE OF DISCHARGE: 07/08/2018 11:35 AM  PRIMARY CARE PHYSICIAN: Kerman PasseyLada, Melinda P, MD    ADMISSION DIAGNOSIS:  Hyponatremia [E87.1] Hypertensive urgency [I16.0] Generalized weakness [R53.1]  DISCHARGE DIAGNOSIS:  Active Problems:   Hypertensive urgency   SECONDARY DIAGNOSIS:   Past Medical History:  Diagnosis Date  . Anxiety state, unspecified   . Cardiac dysrhythmia, unspecified   . Depression   . Dyslipidemia, goal LDL below 130   . Dyspnea    DOE, patient does alot of work for living alone  . Dysrhythmia    patient unsure of this  . Heart murmur   . History of stroke    Right cerebellar intracerebral hemorrhage  . HOH (hard of hearing)   . Hyposmolality and/or hyponatremia   . Stroke [facility](H[facility]) 2012   right hand shakes and can't write well  . Tremors of nervous system    RIGHT ARM  . Tuberculosis 1947   RIGHT UPPER LOBE EXCISION AGE 85  . Unspecified essential hypertension   . Unspecified vitamin D deficiency   . Urinary tract infection, site not specified     HOSPITAL COURSE:   1.  Hyponatremia.  This is secondary to hydrochlorothiazide.  Sodium was 121 when she came in.  Now up to 130.  Initially IV fluids were given.  I stopped the IV fluids and sodium has come up.  Do recommend checking a sodium as outpatient.  Her strength is better.  Do not prescribe hydrochlorothiazide for this patient. 2.  Hypokalemia.  Patient was replaced potassium and IV fluids and orally.  Potassium is better.  I believe that this was also low secondary to hydrochlorothiazide. 3.  Hypomagnesemia.  Replaced IV magnesium yesterday.  Magnesium now in the normal range.  Likely also secondary to hydrochlorothiazide. 4.  Accelerated hypertension on presentation.  Now this is improved.   Patient on metoprolol.  I prescribed Norvasc.  Continue to follow blood pressure as outpatient. 5.  Initial nausea and some difficulty swallowing.  Patient was seen and placed on dysphagia 3 diet by speech pathology.  CT scan of the head was negative for acute abnormalities. 6.  Hyperlipidemia unspecified on simvastatin 7.  Depression anxiety on Lexapro.  They are trying to taper her Ativan to off.  If her sodium does not improve over time as outpatient Lexapro could be another potential culprit. 8.  History of stroke on aspirin and Zocor 9.  Weakness on presentation.  Appreciate physical therapy evaluation patient did well with physical therapy and no further physical therapy needed.  DISCHARGE CONDITIONS:   Satisfactory  CONSULTS OBTAINED:  Speech therapy  DRUG ALLERGIES:   Allergies  Allergen Reactions  . Sertraline   . Penicillins Rash    Has patient had a PCN reaction causing immediate rash, facial/tongue/throat swelling, SOB or lightheadedness with hypotension: Yes Has patient had a PCN reaction causing severe rash involving mucus membranes or skin necrosis: No Has patient had a PCN reaction that required hospitalization: No Has patient had a PCN reaction occurring within the last 10 years: No If all of the above answers are "NO", then may proceed with Cephalosporin use.  . Sulfa Antibiotics Other (See Comments)    Pt does not remember but thinks that it just didn't agree with her  DISCHARGE MEDICATIONS:   Allergies as of 07/08/2018      Reactions   Sertraline    Penicillins Rash   Has patient had a PCN reaction causing immediate rash, facial/tongue/throat swelling, SOB or lightheadedness with hypotension: Yes Has patient had a PCN reaction causing severe rash involving mucus membranes or skin necrosis: No Has patient had a PCN reaction that required hospitalization: No Has patient had a PCN reaction occurring within the last 10 years: No If all of the above answers are  "NO", then may proceed with Cephalosporin use.   Sulfa Antibiotics Other (See Comments)   Pt does not remember but thinks that it just didn't agree with her      Medication List    STOP taking these medications   cloNIDine 0.1 MG tablet Commonly known as:  CATAPRES   triamterene-hydrochlorothiazide 37.5-25 MG tablet Commonly known as:  MAXZIDE-25     TAKE these medications   amLODipine 5 MG tablet Commonly known as:  NORVASC Take 1 tablet (5 mg total) by mouth daily. Start taking on:  July 09, 2018   aspirin 81 MG tablet Take 81 mg by mouth daily.   escitalopram 5 MG tablet Commonly known as:  LEXAPRO Take 1 tablet (5 mg total) by mouth daily.   LORazepam 0.5 MG tablet Commonly known as:  ATIVAN Take 1 tablet (0.5 mg total) by mouth daily as needed for anxiety.   metoprolol succinate 50 MG 24 hr tablet Commonly known as:  TOPROL-XL Take 1 tablet (50 mg total) by mouth daily. Take with or immediately following a meal.   simvastatin 20 MG tablet Commonly known as:  ZOCOR TAKE 1 TABLET(20 MG) BY MOUTH DAILY AT 6 PM FOR CHOLESTEROL   Vitamin D3 125 MCG (5000 UT) Caps Take 1 capsule by mouth daily.        DISCHARGE INSTRUCTIONS:   Follow-up PMD 5 days  If you experience worsening of your admission symptoms, develop shortness of breath, life threatening emergency, suicidal or homicidal thoughts you must seek medical attention immediately by calling 911 or calling your MD immediately  if symptoms less severe.  You Must read complete instructions/literature along with all the possible adverse reactions/side effects for all the Medicines you take and that have been prescribed to you. Take any new Medicines after you have completely understood and accept all the possible adverse reactions/side effects.   Please note  You were cared for by a hospitalist during your hospital stay. If you have any questions about your discharge medications or the care you received  while you were in the hospital after you are discharged, you can call the unit and asked to speak with the hospitalist on call if the hospitalist that took care of you is not available. Once you are discharged, your primary care physician will handle any further medical issues. Please note that NO REFILLS for any discharge medications will be authorized once you are discharged, as it is imperative that you return to your primary care physician (or establish a relationship with a primary care physician if you do not have one) for your aftercare needs so that they can reassess your need for medications and monitor your lab values.    Today   CHIEF COMPLAINT:   Chief Complaint  Patient presents with  . Dizziness  . Nausea    HISTORY OF PRESENT ILLNESS:  Tamara Sparks  is a 85 y.o. female with a known history of hypertension presented with weakness and found to  have hyponatremia.   VITAL SIGNS:  Blood pressure (!) 160/56, pulse 75, temperature 98 F (36.7 C), temperature source Oral, resp. rate 18, height 5' (1.524 m), weight 44 kg, SpO2 97 %.   PHYSICAL EXAMINATION:  GENERAL:  85 y.o.-year-old patient lying in the bed with no acute distress.  EYES: Pupils equal, round, reactive to light and accommodation. No scleral icterus. Extraocular muscles intact.  HEENT: Head atraumatic, normocephalic. Oropharynx and nasopharynx clear.  NECK:  Supple, no jugular venous distention. No thyroid enlargement, no tenderness.  LUNGS: Normal breath sounds bilaterally, no wheezing, rales,rhonchi or crepitation. No use of accessory muscles of respiration.  CARDIOVASCULAR: S1, S2 normal. No murmurs, rubs, or gallops.  ABDOMEN: Soft, non-tender, non-distended. Bowel sounds present. No organomegaly or mass.  EXTREMITIES: No pedal edema, cyanosis, or clubbing.  NEUROLOGIC: Cranial nerves II through XII are intact. Muscle strength 5/5 in all extremities. Sensation intact. Gait not checked.  PSYCHIATRIC: The  patient is alert and oriented x 3.  SKIN: No obvious rash, lesion, or ulcer.   DATA REVIEW:   CBC Recent Labs  Lab 07/05/18 1816  WBC 9.7  HGB 14.4  HCT 39.7  PLT 409*    Chemistries  Recent Labs  Lab 07/05/18 1816  07/08/18 0300  NA 121*   < > 130*  K 3.3*   < > 4.2  CL 81*   < > 96*  CO2 31   < > 28  GLUCOSE 139*   < > 103*  BUN 10   < > 9  CREATININE 0.68   < > 0.62  CALCIUM 9.5   < > 8.5*  MG 2.1   < > 2.1  AST 25  --   --   ALT 14  --   --   ALKPHOS 69  --   --   BILITOT 1.2  --   --    < > = values in this interval not displayed.    Cardiac Enzymes Recent Labs  Lab 07/05/18 1816  TROPONINI <0.03     Management plans discussed with the patient, family and they are in agreement.  CODE STATUS:     Code Status Orders  (From admission, onward)         Start     Ordered   07/05/18 2334  Full code  Continuous     07/05/18 2333        Code Status History    This patient has a current code status but no historical code status.      TOTAL TIME TAKING CARE OF THIS PATIENT: 35 minutes.    Alford Highland M.D on 07/08/2018 at 1:31 PM  Between 7am to 6pm - Pager - [phone]  After 6pm go to [email or web address] - password [email or web address]  [facility] Office  [phone]  CC: Primary care physician; Kerman Passey, MD

## 2018-07-08 NOTE — Progress Notes (Signed)
Pt pending d/c, BP 160/54 after taking oral bp meds, Dr. Renae Gloss made aware. Per MD okay to d/c pt.

## 2018-07-08 NOTE — Progress Notes (Signed)
Iv and tele removed from patient. Discharge instructions given to patient and daughter. Verbalized understanding. No acute distress at this time. Daughter to transport patient home.

## 2018-07-08 NOTE — Care Management Important Message (Signed)
Copy of signed Medicare IM left with patient in room. 

## 2018-07-09 ENCOUNTER — Telehealth: Payer: Self-pay

## 2018-07-09 NOTE — Telephone Encounter (Signed)
Attempted to reach patient for TCM call and schedule hospital follow up appt.

## 2018-07-11 NOTE — Telephone Encounter (Signed)
Transition Care Management Follow-up Telephone Call  Date of discharge and from where: 07/08/18 [facility]  How have you been since you were released from the hospital? Pt states she is okay just feeling nervous due to lack of social support. Pt lives alone and says she isn't close with anyone nearby.   Any questions or concerns? Yes  blood pressure medication and low sodium. Patient was only willing to briefly review medications and when offered support for her anxiety or any other concerns she is having just said, "okay but I think I'm good right now" patient's voice sounded anxious and trembling. Attempted to make hospital follow up visit for her earlier than next regularly scheduled visit on 07/25/18 and she hung up on me.    Items Reviewed:  Did the pt receive and understand the discharge instructions provided? Yes   Medications obtained and verified? Yes   Any new allergies since your discharge? No   Dietary orders reviewed? Yes  Do you have support at home? No   Functional Questionnaire: (I = Independent and D = Dependent) ADLs: I  Bathing/Dressing- I  Meal Prep- I  Eating- I  Maintaining continence- I  Transferring/Ambulation- I  Managing Meds- I  Follow up appointments reviewed:   PCP Hospital f/u appt confirmed? Yes  Scheduled to see regular office visit scheduled on 07/25/18 @ 9:00.  Are transportation arrangements needed? No   If their condition worsens, is the pt aware to call PCP or go to the Emergency Dept.? Yes  Was the patient provided with contact information for the PCP's office or ED? Yes  Was to pt encouraged to call back with questions or concerns? Yes

## 2018-07-16 ENCOUNTER — Other Ambulatory Visit: Payer: Self-pay | Admitting: Family Medicine

## 2018-07-24 ENCOUNTER — Other Ambulatory Visit: Payer: Self-pay | Admitting: Family Medicine

## 2018-07-24 NOTE — Telephone Encounter (Signed)
30 day supply not done yet appt tomorrow Want to assess response to SSRI prior to more refills She should not be out until we've seen her

## 2018-07-25 ENCOUNTER — Encounter: Payer: Self-pay | Admitting: Family Medicine

## 2018-07-25 ENCOUNTER — Ambulatory Visit (INDEPENDENT_AMBULATORY_CARE_PROVIDER_SITE_OTHER): Payer: Medicare Other | Admitting: Nurse Practitioner

## 2018-07-25 VITALS — BP 140/78 | HR 92 | Temp 98.1°F | Resp 16 | Ht 60.0 in | Wt 95.8 lb

## 2018-07-25 DIAGNOSIS — E876 Hypokalemia: Secondary | ICD-10-CM

## 2018-07-25 DIAGNOSIS — Z09 Encounter for follow-up examination after completed treatment for conditions other than malignant neoplasm: Secondary | ICD-10-CM

## 2018-07-25 DIAGNOSIS — F411 Generalized anxiety disorder: Secondary | ICD-10-CM

## 2018-07-25 DIAGNOSIS — I160 Hypertensive urgency: Secondary | ICD-10-CM

## 2018-07-25 DIAGNOSIS — E871 Hypo-osmolality and hyponatremia: Secondary | ICD-10-CM | POA: Diagnosis not present

## 2018-07-25 NOTE — Progress Notes (Signed)
BP 140/78 (BP Location: Right Arm, Patient Position: Supine)   Pulse 92   Temp 98.1 F (36.7 C)   Resp 16   Ht 5' (1.524 m)   Wt 95 lb 12.8 oz (43.5 kg)   SpO2 97%   BMI 18.71 kg/m    Subjective:    Patient ID: Tamara Sparks, female    DOB: 1929/04/24, 85 y.o.   MRN: [id number]  HPI: Tamara Sparks is a 85 y.o. female  Chief Complaint  Patient presents with  . Follow-up    HPI  Patient presents for hospital follow-up.  Patient was admitted to [facility] on 2/14 to 2/17 for hypertensive urgency c/o generalized weakness, lightheadedness and nausea. Patient noted to additionally noted to have dehydration, hypochloremic hyponatremia, hypokalemia likely due to diuretic. Patient given IV fluids,  electrolytes replacement and placed on cardene drip short-term. Upon discharge sodium was 130, chloride 96, potassium normalized to 4.2  TOC call 07/09/2018 by Reather Littler.   For blood pressure management patient is taking metoprolol 50mg  daily and amlodipine 5mg  daily. She had previously tried amlodipine in 02/2018 and has noted new onset of leg swelling that resolved with elevation. She had additionally tried clonidine, chlorthalidone, hydralazine, losartan, micardis and bystolic for BP management.   States since hospitalization has felt much better, regained strength. States has a walker at home that she uses occasionally when walking throughout the house. Still able to do laundry, make food. No falls at home. Weakness has resolved. Weakness, lightheadedness and nausea has resolved. Appetite is fair- its her baseline. Patient states she is taking all of her medications every day.   Patient states takes lexapro 5mg  every day states she feels sometimes it makes her more nervous- states she takes it every morning and notices it feels that way about a half hour afterwards.   Ativan taking 0.5gmg tablet every day and then plans to go down to 0.5mg  every other day.  Denies chest pain,  headaches. Endorses mild blurry vision when she stares at the TV too long but it resolves when she stares away.    Depression screen [facility] 2/9 07/25/2018 06/24/2018 04/02/2018 01/01/2018 09/04/2017  Decreased Interest 3 0 3 0 1  Down, Depressed, Hopeless 3 0 3 3 1   PHQ - 2 Score 6 0 6 3 2   Altered sleeping 0 0 0 0 0  Tired, decreased energy 3 0 0 1 1  Change in appetite 0 0 0 1 1  Feeling bad or failure about yourself  0 0 0 0 0  Trouble concentrating 0 0 0 0 0  Moving slowly or fidgety/restless 0 0 0 1 0  Suicidal thoughts 0 0 0 0 0  PHQ-9 Score 9 0 6 6 4   Difficult doing work/chores Not difficult at all Not difficult at all Not difficult at all Not difficult at all Not difficult at all   Fall Risk  07/25/2018 06/24/2018 04/02/2018 02/22/2018 02/22/2018  Falls in the past year? 0 0 1 No No  Comment - - - - -  Number falls in past yr: - - 0 - -  Injury with Fall? - - 0 - -  Risk Factor Category  - - - - -  Risk for fall due to : - - - - -  Risk for fall due to: Comment - - - - -  Follow up - - - - -    Relevant past medical, surgical, family and social history reviewed Past Medical  History:  Diagnosis Date  . Anxiety state, unspecified   . Cardiac dysrhythmia, unspecified   . Depression   . Dyslipidemia, goal LDL below 130   . Dyspnea    DOE, patient does alot of work for living alone  . Dysrhythmia    patient unsure of this  . Heart murmur   . History of stroke    Right cerebellar intracerebral hemorrhage  . HOH (hard of hearing)   . Hyposmolality and/or hyponatremia   . Stroke [facility]) 2012   right hand shakes and can't write well  . Tremors of nervous system    RIGHT ARM  . Tuberculosis 1947   RIGHT UPPER LOBE EXCISION AGE 62  . Unspecified essential hypertension   . Unspecified vitamin D deficiency   . Urinary tract infection, site not specified    Past Surgical History:  Procedure Laterality Date  . CARDIAC CATHETERIZATION     MC  . CATARACT EXTRACTION W/PHACO Right  03/08/2016   Procedure: CATARACT EXTRACTION PHACO AND INTRAOCULAR LENS PLACEMENT (IOC);  Surgeon: Sallee Lange, MD;  Location: [facility] ORS;  Service: Ophthalmology;  Laterality: Right;   FLUID CASSETTE [id number] H, EXP 07/19/17 [place]    3:16.8   AP%   27.2 CDE 94.14  . EXTERNAL EAR SURGERY Left   . LOBECTOMY Right 1952   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17   Family History  Problem Relation Age of Onset  . Stroke Mother   . Hypertension Father   . Stroke Father   . Hypertension Sister   . Hypertension Brother   . Cancer Brother        brain  . Hypertension Sister   . Hypertension Brother   . Cancer Brother        stomach   Social History   Tobacco Use  . Smoking status: Never Smoker  . Smokeless tobacco: Never Used  . Tobacco comment: smoking cessation materials not required  Substance Use Topics  . Alcohol use: No    Alcohol/week: 0.0 standard drinks  . Drug use: No     Office Visit from 07/25/2018 in [facility]  AUDIT-C Score  0      Interim medical history since last visit reviewed. Allergies and medications reviewed  Review of Systems Per HPI unless specifically indicated above     Objective:    BP 140/78 (BP Location: Right Arm, Patient Position: Supine)   Pulse 92   Temp 98.1 F (36.7 C)   Resp 16   Ht 5' (1.524 m)   Wt 95 lb 12.8 oz (43.5 kg)   SpO2 97%   BMI 18.71 kg/m   Wt Readings from Last 3 Encounters:  07/25/18 95 lb 12.8 oz (43.5 kg)  07/07/18 97 lb (44 kg)  07/02/18 98 lb 9.6 oz (44.7 kg)    Physical Exam Vitals signs reviewed.  Constitutional:      Appearance: Normal appearance. She is well-developed.     Comments: Patient is hard of hearing Presents with daughter  HENT:     Head: Normocephalic and atraumatic.     Nose: No congestion.     Mouth/Throat:     Mouth: Mucous membranes are moist.     Pharynx: Oropharynx is clear. No posterior oropharyngeal erythema.  Eyes:     Extraocular Movements: Extraocular  movements intact.     Conjunctiva/sclera: Conjunctivae normal.     Pupils: Pupils are equal, round, and reactive to light.  Neck:     Musculoskeletal:  Normal range of motion and neck supple.     Vascular: No carotid bruit.  Cardiovascular:     Pulses: Normal pulses.     Heart sounds: Normal heart sounds.  Pulmonary:     Effort: Pulmonary effort is normal.     Breath sounds: Normal breath sounds.  Abdominal:     General: Bowel sounds are normal.     Palpations: Abdomen is soft.     Tenderness: There is no abdominal tenderness.  Musculoskeletal: Normal range of motion.     Right lower leg: No edema.     Left lower leg: No edema.  Skin:    General: Skin is warm and dry.     Capillary Refill: Capillary refill takes less than 2 seconds.  Neurological:     Mental Status: She is alert and oriented to person, place, and time. Mental status is at baseline.     GCS: GCS eye subscore is 4. GCS verbal subscore is 5. GCS motor subscore is 6.     Sensory: No sensory deficit.     Motor: No weakness.     Coordination: Coordination normal.     Gait: Gait normal.  Psychiatric:        Speech: Speech normal.        Behavior: Behavior normal.        Thought Content: Thought content normal.        Judgment: Judgment normal.     Results for orders placed or performed during the hospital encounter of 07/05/18  Lipase, blood  Result Value Ref Range   Lipase 63 (H) 11 - 51 U/L  Comprehensive metabolic panel  Result Value Ref Range   Sodium 121 (L) 135 - 145 mmol/L   Potassium 3.3 (L) 3.5 - 5.1 mmol/L   Chloride 81 (L) 98 - 111 mmol/L   CO2 31 22 - 32 mmol/L   Glucose, Bld 139 (H) 70 - 99 mg/dL   BUN 10 8 - 23 mg/dL   Creatinine, Ser 9.41 0.44 - 1.00 mg/dL   Calcium 9.5 8.9 - 74.0 mg/dL   Total Protein 7.4 6.5 - 8.1 g/dL   Albumin 4.3 3.5 - 5.0 g/dL   AST 25 15 - 41 U/L   ALT 14 0 - 44 U/L   Alkaline Phosphatase 69 38 - 126 U/L   Total Bilirubin 1.2 0.3 - 1.2 mg/dL   GFR calc non Af  Amer >60 >60 mL/min   GFR calc Af Amer >60 >60 mL/min   Anion gap 9 5 - 15  CBC  Result Value Ref Range   WBC 9.7 4.0 - 10.5 K/uL   RBC 4.79 3.87 - 5.11 MIL/uL   Hemoglobin 14.4 12.0 - 15.0 g/dL   HCT 81.4 48.1 - 85.6 %   MCV 82.9 80.0 - 100.0 fL   MCH 30.1 26.0 - 34.0 pg   MCHC 36.3 (H) 30.0 - 36.0 g/dL   RDW 31.4 97.0 - 26.3 %   Platelets 409 (H) 150 - 400 K/uL   nRBC 0.0 0.0 - 0.2 %  Urinalysis, Complete w Microscopic  Result Value Ref Range   Color, Urine YELLOW (A) YELLOW   APPearance CLEAR (A) CLEAR   Specific Gravity, Urine 1.004 (L) 1.005 - 1.030   pH 7.0 5.0 - 8.0   Glucose, UA NEGATIVE NEGATIVE mg/dL   Hgb urine dipstick NEGATIVE NEGATIVE   Bilirubin Urine NEGATIVE NEGATIVE   Ketones, ur NEGATIVE NEGATIVE mg/dL   Protein, ur NEGATIVE NEGATIVE mg/dL  Nitrite NEGATIVE NEGATIVE   Leukocytes,Ua TRACE (A) NEGATIVE   RBC / HPF 0-5 0 - 5 RBC/hpf   WBC, UA 6-10 0 - 5 WBC/hpf   Bacteria, UA MANY (A) NONE SEEN   Squamous Epithelial / LPF NONE SEEN 0 - 5  Troponin I - Add-On to previous collection  Result Value Ref Range   Troponin I <0.03 <0.03 ng/mL  Brain natriuretic peptide  Result Value Ref Range   B Natriuretic Peptide 85.0 0.0 - 100.0 pg/mL  Magnesium  Result Value Ref Range   Magnesium 2.1 1.7 - 2.4 mg/dL  Phosphorus  Result Value Ref Range   Phosphorus 2.9 2.5 - 4.6 mg/dL  Basic metabolic panel  Result Value Ref Range   Sodium 126 (L) 135 - 145 mmol/L   Potassium 3.1 (L) 3.5 - 5.1 mmol/L   Chloride 89 (L) 98 - 111 mmol/L   CO2 30 22 - 32 mmol/L   Glucose, Bld 101 (H) 70 - 99 mg/dL   BUN 9 8 - 23 mg/dL   Creatinine, Ser 1.61 0.44 - 1.00 mg/dL   Calcium 8.8 (L) 8.9 - 10.3 mg/dL   GFR calc non Af Amer >60 >60 mL/min   GFR calc Af Amer >60 >60 mL/min   Anion gap 7 5 - 15  TSH  Result Value Ref Range   TSH 1.490 0.350 - 4.500 uIU/mL  Basic metabolic panel  Result Value Ref Range   Sodium 126 (L) 135 - 145 mmol/L   Potassium 3.5 3.5 - 5.1 mmol/L    Chloride 93 (L) 98 - 111 mmol/L   CO2 28 22 - 32 mmol/L   Glucose, Bld 103 (H) 70 - 99 mg/dL   BUN 10 8 - 23 mg/dL   Creatinine, Ser 0.96 0.44 - 1.00 mg/dL   Calcium 8.4 (L) 8.9 - 10.3 mg/dL   GFR calc non Af Amer >60 >60 mL/min   GFR calc Af Amer >60 >60 mL/min   Anion gap 5 5 - 15  Lipid panel  Result Value Ref Range   Cholesterol 113 0 - 200 mg/dL   Triglycerides 45 <045 mg/dL   HDL 54 >40 mg/dL   Total CHOL/HDL Ratio 2.1 RATIO   VLDL 9 0 - 40 mg/dL   LDL Cholesterol 50 0 - 99 mg/dL  Magnesium  Result Value Ref Range   Magnesium 1.8 1.7 - 2.4 mg/dL  Basic metabolic panel  Result Value Ref Range   Sodium 130 (L) 135 - 145 mmol/L   Potassium 4.2 3.5 - 5.1 mmol/L   Chloride 96 (L) 98 - 111 mmol/L   CO2 28 22 - 32 mmol/L   Glucose, Bld 103 (H) 70 - 99 mg/dL   BUN 9 8 - 23 mg/dL   Creatinine, Ser 9.81 0.44 - 1.00 mg/dL   Calcium 8.5 (L) 8.9 - 10.3 mg/dL   GFR calc non Af Amer >60 >60 mL/min   GFR calc Af Amer >60 >60 mL/min   Anion gap 6 5 - 15  Magnesium  Result Value Ref Range   Magnesium 2.1 1.7 - 2.4 mg/dL  Lipase, blood  Result Value Ref Range   Lipase 54 (H) 11 - 51 U/L      Assessment & Plan:  1. Hospital discharge follow-up Much improved, back to baseline.  - Basic Metabolic Panel (BMET)  2. Hypertensive urgency Blood pressure at goal today. No peripheral edema noted with amlodipine this time.  - Basic Metabolic Panel (BMET)  3. Hyponatremia  Consider stopping SSRI if still low  - Basic Metabolic Panel (BMET)  4. Hypokalemia Resolved in hospitalization  - Basic Metabolic Panel (BMET)  5. Hypocalcemia Monitor, consider supplementation  - Basic Metabolic Panel (BMET)  6. Generalized anxiety disorder Continue lexapro and established plan with provider for titration of ativan. Discussed side effects and treatment plan with daughter and patient; increased anxiety more likely contributed to decreasing dose of ativan, daughter notes this is more likely.     Follow up plan: Return in about 3 months (around 10/25/2018) for Follow-up.  An after-visit summary was printed and given to the patient at check-out.  Please see the patient instructions which may contain other information and recommendations beyond what is mentioned above in the assessment and plan.    Orders Placed This Encounter  Procedures  . Basic Metabolic Panel (BMET)

## 2018-07-25 NOTE — Patient Instructions (Signed)
You are doing great, and have improved much more. You have good strength and circulation. Keep doing what you're doing and we will check your electrolytes to make sure they have improved.    If you have not heard anything from my staff in a week about any orders/referrals/studies from today, please contact us here to follow-up [phone]

## 2018-07-26 LAB — BASIC METABOLIC PANEL
BUN: 10 mg/dL (ref 7–25)
CO2: 30 mmol/L (ref 20–32)
Calcium: 10.2 mg/dL (ref 8.6–10.4)
Chloride: 98 mmol/L (ref 98–110)
Creat: 0.82 mg/dL (ref 0.60–0.88)
Glucose, Bld: 126 mg/dL — ABNORMAL HIGH (ref 65–99)
Potassium: 4.2 mmol/L (ref 3.5–5.3)
Sodium: 137 mmol/L (ref 135–146)

## 2018-08-06 ENCOUNTER — Telehealth: Payer: Self-pay | Admitting: Family Medicine

## 2018-08-06 ENCOUNTER — Other Ambulatory Visit: Payer: Self-pay | Admitting: Family Medicine

## 2018-08-06 NOTE — Telephone Encounter (Unsigned)
Copied from CRM [id number]. Topic: Quick Communication - Rx Refill/Question >> Aug 06, 2018  9:40 AM Marylen Ponto wrote: Medication: escitalopram (LEXAPRO) 5 MG tablet  Has the patient contacted their pharmacy? no  Preferred Pharmacy (with phone number or street name): [id number] [place], [place] - [address] [phone] (Phone)  [phone] (Fax)  Agent: Please be advised that RX refills may take up to 3 business days. We ask that you follow-up with your pharmacy.

## 2018-08-08 ENCOUNTER — Other Ambulatory Visit: Payer: Self-pay | Admitting: Nurse Practitioner

## 2018-08-08 ENCOUNTER — Other Ambulatory Visit: Payer: Self-pay | Admitting: Family Medicine

## 2018-08-08 MED ORDER — AMLODIPINE BESYLATE 5 MG PO TABS: 5.0000 mg | ORAL_TABLET | Freq: Every day | ORAL | 0 refills | Status: DC

## 2018-08-08 NOTE — Telephone Encounter (Signed)
Refill request was sent to Elizabeth E. Poulose, DNP for approval and submission.  

## 2018-08-08 NOTE — Telephone Encounter (Signed)
Copied from CRM [id number]. Topic: Quick Communication - Rx Refill/Question >> Aug 08, 2018  8:09 AM Crist Infante wrote: Medication: amLODipine (NORVASC) 5 MG tablet  pt was prescribed this in the hospital.  Pt saw Lanora Manis 3/05 and thought this was sent to pharmacy. Pt is out and may have very high bp without so needs asap. Dr Sherie Don never prescribed.  [id number] - [place], [place] - [address] AT [facility] [address] [place] [phone] (Phone) [phone] (Fax)

## 2018-08-29 ENCOUNTER — Other Ambulatory Visit: Payer: Self-pay | Admitting: Family Medicine

## 2018-10-30 ENCOUNTER — Telehealth: Payer: Self-pay | Admitting: Family Medicine

## 2018-10-30 NOTE — Chronic Care Management (AMB) (Signed)
°  Chronic Care Management   Outreach Note  10/30/2018 Name: Tamara Sparks MRN: [id number] DOB: [birth year]  Referred by: Arnetha Courser, MD Reason for referral : Chronic Care Management (Initial CCM outreach call was unsuccessful.)   An unsuccessful telephone outreach was attempted today. The patient was referred to the case management team by for assistance with chronic care management and care coordination.   Follow Up Plan: A HIPPA compliant phone message was left for the patient providing contact information and requesting a return call.  The care management team will reach out to the patient again over the next 7 days.  If patient returns call to provider office, please advise to call [place] [facility] [place]  [email or web address]   ??[phone]

## 2018-11-05 ENCOUNTER — Other Ambulatory Visit: Payer: Self-pay | Admitting: Family Medicine

## 2018-11-05 MED ORDER — AMLODIPINE BESYLATE 5 MG PO TABS: ORAL_TABLET | ORAL | 0 refills | Status: DC

## 2018-11-05 NOTE — Chronic Care Management (AMB) (Signed)
°  Chronic Care Management   Outreach Note  11/05/2018 Name: Tamara Sparks MRN: [id number] DOB: [birth year]  Referred by: Arnetha Courser, MD Reason for referral : Chronic Care Management (Initial CCM outreach call was unsuccessful.)   A second unsuccessful telephone outreach was attempted today. The patient was referred to the case management team for assistance with chronic care management and care coordination.   Follow Up Plan: The care management team will reach out to the patient again over the next 7 days.   [place]  [email or web address]   ??[phone]

## 2018-11-05 NOTE — Telephone Encounter (Signed)
Copied from [place] [id number]. Topic: Quick Communication - Rx Refill/Question >> Nov 05, 2018  1:24 PM Alanda Slim E wrote: Medication: amLODipine (NORVASC) 5 MG tablet  Has the patient contacted their pharmacy? Yes   Preferred Pharmacy (with phone number or street name): [id number] [place], [place] [phone] (Phone) [phone] (Fax)    Agent: Please be advised that RX refills may take up to 3 business days. We ask that you follow-up with your pharmacy.

## 2018-11-14 NOTE — Chronic Care Management (AMB) (Signed)
Chronic Care Management   Note  11/14/2018 Name: Tamara Sparks MRN: [id number] DOB: December 21, 1928  Tamara Sparks is a 85 y.o. year old female who is a primary care patient of Lada, Satira Anis, MD. I reached out to Tamara Sparks by phone today in response to a referral sent by [name]'s health plan.    [name] was given information about Chronic Care Management services today including:  1. CCM service includes personalized support from designated clinical staff supervised by her physician, including individualized plan of care and coordination with other care providers 2. 24/7 contact phone numbers for assistance for urgent and routine care needs. 3. Service will only be billed when office clinical staff spend 20 minutes or more in a month to coordinate care. 4. Only one practitioner may furnish and bill the service in a calendar month. 5. The patient may stop CCM services at any time (effective at the end of the month) by phone call to the office staff. 6. The patient will be responsible for cost sharing (co-pay) of up to 20% of the service fee (after annual deductible is met).  Patient agreed to services and verbal consent obtained.   Follow up plan: Telephone appointment with CCM team member scheduled for: 11/20/2018  [place]  [email or web address]   ??[phone]

## 2018-11-20 ENCOUNTER — Other Ambulatory Visit: Payer: Self-pay

## 2018-11-20 ENCOUNTER — Ambulatory Visit: Payer: Medicare Other

## 2018-11-20 DIAGNOSIS — I160 Hypertensive urgency: Secondary | ICD-10-CM

## 2018-11-20 DIAGNOSIS — F411 Generalized anxiety disorder: Secondary | ICD-10-CM

## 2018-11-20 DIAGNOSIS — E785 Hyperlipidemia, unspecified: Secondary | ICD-10-CM

## 2018-11-20 NOTE — Chronic Care Management (AMB) (Signed)
Chronic Care Management   Initial Visit Note  11/20/2018 Name: Tamara Sparks MRN: [id number] DOB: 06/01/1928  Subjective: "I think I am doing pretty good for 85 years old except for my blood pressure some times" "My daughter and doctor said not to worry about checking my blood pressure because I just worry too much and will be anxious if it is high"  Objective:  BP Readings from Last 3 Encounters:  07/25/18 140/78  07/08/18 (!) 160/56  07/02/18 (!) 182/86   Lab Results  Component Value Date   HGBA1C 5.5 09/23/2015     Assessment: Tamara Sparks is an 85 year old female patient who was seeing Dr. Enid Derry for primary care. She continue to be a patient of [facility] however has not been assigned a new PCP since Dr. Delight Ovens ongoing absence. She was referred to chronic case management by her health plan and today RN CM completed initial assessment and established health goal (s) with patient.  Review of patient status, including review of consultants reports, relevant laboratory and other test results, and collaboration with appropriate care team members and the patient's provider was performed as part of comprehensive patient evaluation and provision of chronic care management services.    Advanced Directives 11/20/2018  Does Patient Have a Medical Advance Directive? Yes  Type of Advance Directive [place]  Does patient want to make changes to medical advance directive? No - Patient declined  Copy of [place] in Chart? Yes - validated most recent copy scanned in chart (See row information)  Would patient like information on creating a medical advance directive? -     Goals Addressed            This Visit's Progress   . My blood pressure seems to be my only problem (pt-stated)       Ms. Townsel states she is doing well. She is independent in all ADLs and remains active by walking to her mailbox and cleaning her home. She takes all  medications as prescribed except her vitamin D3. She is very conscious about maintaining a low sodium diet and admits to not eating any canned foods and only eating fresh or frozen vegetables. Her daughter is currently providing her fresh vegetables from the garden. She does not use salt and monitors the swelling in her lower legs which is relieved with elevation. She DOES NOT check her BP at home. She does not have a cuff and has been told by her daughter and Dr. Sanda Klein that it is OK not to check at home because if it were high it would only cause her anxiety. She states she rests often "when I can tell that my blood pressure is elevated."   Current Barriers:  . Lack of BP cuff in home  Nurse Case Manager Clinical Goal(s):  Marland Kitchen Over the next 60 days, patient will not experience hospital admission. Hospital Admissions in last 6 months = 1 for HTN emergency . Over the next 14 days, patient will verbalize basic understanding of hypertension disease process and self health management plan as evidenced by continuing to take medications as prescribed including HTN and anxiety medications, continuing to adhere to low sodium/DASH diet, resting when feels BP is elevated, and notifying PCP/daughters if s/s of hypertension do not go away with rest.  Interventions:  . Evaluation of current treatment plan related to hypertension self management and patient's adherence to plan as established by provider. . Provided education to  patient re: stroke prevention, s/s of heart attack and stroke, DASH diet, complications of uncontrolled blood pressure . Reviewed medications with patient and discussed importance of compliance . Discussed plans with patient for ongoing care management follow up and provided patient with direct contact information for care management team . Reviewed scheduled/upcoming provider appointments including: no upcoming appointments . Left message for daughter/POA to call back to discuss mothers  health (per patient request)  Patient Self Care Activities:  . Self administers medications as prescribed . Attends all scheduled provider appointments . Calls provider office for new concerns, questions, or BP outside discussed parameters . Follows a low sodium diet/DASH diet  Initial goal documentation         Follow up plan:  Telephone follow up appointment with care management team member scheduled for: 2 weeks Will await return call from daughter/POA [facility]     Jerimie Mancuso E. Suzie PortelaPayne, RN, BSN Nurse Care Coordinator [facility] Care Management  [phone]

## 2018-11-20 NOTE — Patient Instructions (Signed)
Thank you allowing the Chronic Care Management Team to be a part of your care! It was a pleasure speaking with you today!  1. Keep taking all your medications as prescribed. 2. Remember your Lexapro will not work unless you take it every day 3. You are doing a great job with eating a low sodium diet. Keep up the good work and enjoy your summer vegetables. 4. Remember to use your walker to prevent falls. 5. Continue to take rest breaks through out your day if you feel this improves your blood pressure 6. I will discuss our conversation today with your daughter when when she returns my call.  CCM (Chronic Care Management) Team   Trish Fountain RN, BSN Nurse Care Coordinator  [phone]  Ruben Reason PharmD  Clinical Pharmacist  [phone]   Elliot Gurney, LCSW Clinical Social Worker [phone]  Goals Addressed            This Visit's Progress   . My blood pressure seems to be my only problem (pt-stated)       Current Barriers:  . Lack of BP cuff in home  Nurse Case Manager Clinical Goal(s):  Marland Kitchen Over the next 60 days, patient will not experience hospital admission. Hospital Admissions in last 6 months = 1 for HTN emergency . Over the next 14 days, patient will verbalize basic understanding of hypertension disease process and self health management plan as evidenced by continuing to take medications as prescribed including HTN and anxiety medications, continuing to adhere to low sodium/DASH diet, resting when feels BP is elevated, and notifying PCP/daughters if s/s of hypertension do not go away with rest.  Interventions:  . Evaluation of current treatment plan related to hypertension self management and patient's adherence to plan as established by provider. . Provided education to patient re: stroke prevention, s/s of heart attack and stroke, DASH diet, complications of uncontrolled blood pressure . Reviewed medications with patient and discussed importance of  compliance . Discussed plans with patient for ongoing care management follow up and provided patient with direct contact information for care management team . Reviewed scheduled/upcoming provider appointments including: no upcoming appointments . Left message for daughter/POA to call back to discuss mothers health (per patient request)  Patient Self Care Activities:  . Self administers medications as prescribed . Attends all scheduled provider appointments . Calls provider office for new concerns, questions, or BP outside discussed parameters . Follows a low sodium diet/DASH diet  Initial goal documentation        The patient verbalized understanding of instructions provided today and declined a print copy of patient instruction materials.   Telephone follow up appointment with care management team member scheduled for: 2 weeks  SYMPTOMS OF A STROKE   You have any symptoms of stroke. "BE FAST" is an easy way to remember the main warning signs: ? B - Balance. Signs are dizziness, sudden trouble walking, or loss of balance. ? E - Eyes. Signs are trouble seeing or a sudden change in how you see. ? F - Face. Signs are sudden weakness or loss of feeling of the face, or the face or eyelid drooping on one side. ? A - Arms. Signs are weakness or loss of feeling in an arm. This happens suddenly and usually on one side of the body. ? S - Speech. Signs are sudden trouble speaking, slurred speech, or trouble understanding what people say. ? T - Time. Time to call emergency services. Write down what  time symptoms started.  You have other signs of stroke, such as: ? A sudden, very bad headache with no known cause. ? Feeling sick to your stomach (nausea). ? Throwing up (vomiting). ? Jerky movements you cannot control (seizure).  SYMPTOMS OF A HEART ATTACK  What are the signs or symptoms? Symptoms of this condition include:  Chest pain. It may feel like: ? Crushing or  squeezing. ? Tightness, pressure, fullness, or heaviness.  Pain in the arm, neck, jaw, back, or upper body.  Shortness of breath.  Heartburn.  Indigestion.  Nausea.  Cold sweats.  Feeling tired.  Sudden lightheadedness.

## 2018-12-04 ENCOUNTER — Other Ambulatory Visit: Payer: Self-pay

## 2018-12-04 ENCOUNTER — Ambulatory Visit: Payer: Self-pay

## 2018-12-04 DIAGNOSIS — F411 Generalized anxiety disorder: Secondary | ICD-10-CM

## 2018-12-04 DIAGNOSIS — I10 Essential (primary) hypertension: Secondary | ICD-10-CM

## 2018-12-04 NOTE — Patient Instructions (Addendum)
Thank you allowing the Chronic Care Management Team to be a part of your care! It was a pleasure speaking with you today!  1. Keep taking all your medications as prescribed. 2. You are doing a great job with eating a low sodium diet. Keep up the good work and enjoy your summer vegetables. 3. Remember to use your walker to prevent falls. 4. Continue to take rest breaks through out your day if you feel this improves your blood pressure  CCM (Chronic Care Management) Team   Trish Fountain RN, BSN Nurse Care Coordinator  [phone]  Ruben Reason PharmD  Clinical Pharmacist  [phone]   [place], LCSW Clinical Social Worker [phone]  Goals Addressed            This Visit's Progress   . My blood pressure seems to be my only problem (pt-stated)       Current Barriers:  . Lack of BP cuff in home  Nurse Case Manager Clinical Goal(s):  Marland Kitchen Over the next 60 days, patient will not experience hospital admission. Hospital Admissions in last 6 months = 1 for HTN emergency . Over the next 30 days, patient will verbalize basic understanding of hypertension disease process and self health management plan as evidenced by continuing to take medications as prescribed including HTN and anxiety medications, continuing to adhere to low sodium/DASH diet, resting when feels BP is elevated, and notifying PCP/daughters if s/s of hypertension do not go away with rest.  Interventions:  . Evaluation of current treatment plan related to hypertension self management and patient's adherence to plan as established by provider/CCM RN CM . Assessed for ongoing anxiety. . Assessed for medication adherence . Discussed plans with patient for ongoing care management follow up and provided patient with direct contact information for care management team . Reviewed scheduled/upcoming provider appointments including: no upcoming appointments  Patient Self Care Activities:  . Self administers  medications as prescribed . Attends all scheduled provider appointments . Calls provider office for new concerns, questions, or BP outside discussed parameters . Follows a low sodium diet/DASH diet  Please see past updates related to this goal by clicking on the "Past Updates" button in the selected goal         The patient verbalized understanding of instructions provided today and declined a print copy of patient instruction materials.   Telephone follow up appointment with care management team member scheduled for: 1 month  SYMPTOMS OF A STROKE   You have any symptoms of stroke. "BE FAST" is an easy way to remember the main warning signs: ? B - Balance. Signs are dizziness, sudden trouble walking, or loss of balance. ? E - Eyes. Signs are trouble seeing or a sudden change in how you see. ? F - Face. Signs are sudden weakness or loss of feeling of the face, or the face or eyelid drooping on one side. ? A - Arms. Signs are weakness or loss of feeling in an arm. This happens suddenly and usually on one side of the body. ? S - Speech. Signs are sudden trouble speaking, slurred speech, or trouble understanding what people say. ? T - Time. Time to call emergency services. Write down what time symptoms started.  You have other signs of stroke, such as: ? A sudden, very bad headache with no known cause. ? Feeling sick to your stomach (nausea). ? Throwing up (vomiting). ? Jerky movements you cannot control (seizure).  SYMPTOMS OF A HEART ATTACK  What are the signs or symptoms? Symptoms of this condition include:  Chest pain. It may feel like: ? Crushing or squeezing. ? Tightness, pressure, fullness, or heaviness.  Pain in the arm, neck, jaw, back, or upper body.  Shortness of breath.  Heartburn.  Indigestion.  Nausea.  Cold sweats.  Feeling tired.  Sudden lightheadedness.

## 2018-12-04 NOTE — Chronic Care Management (AMB) (Signed)
Chronic Care Management   Follow Up Note   12/04/2018 Name: Tamara Sparks MRN: [id number] DOB: 05/10/1929  Referred by: Arnetha Courser, MD Reason for referral : Chronic Care Management (follow up HTN)   Subjective: "I think I am doing alright" "I have not had any spells of my blood pressure being too high"   Objective:  BP Readings from Last 3 Encounters:  07/25/18 140/78  07/08/18 (!) 160/56  07/02/18 (!) 182/86    Assessment: Tamara Sparks is an 85 year old female patient who was seeing Dr. Enid Derry for primary care. She continue to be a patient of [facility] however has not been assigned a new PCP since Dr. Delight Ovens ongoing absence. She was referred to chronic case management by her health plan. RN CM completed initial assessment and established health goal (s) with patient 11/20/2018. Today RN CM reached out to patient via telephone to discuss progression towards goals.  Review of patient status, including review of consultants reports, relevant laboratory and other test results, and collaboration with appropriate care team members and the patient's provider was performed as part of comprehensive patient evaluation and provision of chronic care management services.    Goals Addressed            This Visit's Progress    My blood pressure seems to be my only problem (pt-stated)       Tamara Sparks say she is doing well. She continues to do housework without difficult and work in her yard. She has a daughter that keeps close contact with her and does her errands including grocery shopping. She is taking her medications as prescribed and does not add salt to her foods. Her daughter is providing her with fresh vegetables from the garden. She does not check her BP as her daughter and Dr. Sanda Klein felt her anxiety would only be exacerbated by focusing on her BP readings. She states she is able to tell when her BP is elevated (headache, flushed skin), and she has not experienced  those symptoms in some time.  Current Barriers:   Lack of BP cuff in home  Nurse Case Manager Clinical Goal(s):   Over the next 60 days, patient will not experience hospital admission. Hospital Admissions in last 6 months = 1 for HTN emergency  Over the next 30 days, patient will verbalize basic understanding of hypertension disease process and self health management plan as evidenced by continuing to take medications as prescribed including HTN and anxiety medications, continuing to adhere to low sodium/DASH diet, resting when feels BP is elevated, and notifying PCP/daughters if s/s of hypertension do not go away with rest.  Interventions:   Evaluation of current treatment plan related to hypertension self management and patient's adherence to plan as established by provider/CCM RN CM  Assessed for ongoing anxiety.  Assessed for medication adherence  Discussed plans with patient for ongoing care management follow up and provided patient with direct contact information for care management team  Reviewed scheduled/upcoming provider appointments including: no upcoming appointments  Patient Self Care Activities:   Self administers medications as prescribed  Attends all scheduled provider appointments  Calls provider office for new concerns, questions, or BP outside discussed parameters  Follows a low sodium diet/DASH diet  Please see past updates related to this goal by clicking on the "Past Updates" button in the selected goal          Telephone follow up appointment with care management team member scheduled for:  30 days   Tamara Sparks E. Suzie PortelaPayne, RN, BSN Nurse Care Coordinator [facility] Care Management  [phone]

## 2019-02-21 ENCOUNTER — Other Ambulatory Visit: Payer: Self-pay | Admitting: Family Medicine

## 2019-02-21 NOTE — Telephone Encounter (Signed)
Requested medication (s) are due for refill today: yes  Requested medication (s) are on the active medication list: yes  Last refill:  12/17/2018  Future visit scheduled: no  Notes to clinic:  Review for refill   Requested Prescriptions  Pending Prescriptions Disp Refills   metoprolol succinate (TOPROL-XL) 50 MG 24 hr tablet [Pharmacy Med Name: METOPROLOL SUCC ER 50 MG TAB] 90 tablet 3    Sig: Take 1 tablet (50 mg total) by mouth daily. Take with or immediately following a meal.     Cardiovascular:  Beta Blockers Failed - 02/21/2019 12:46 PM      Failed - Last BP in normal range    BP Readings from Last 1 Encounters:  07/25/18 140/78         Failed - Valid encounter within last 6 months    Recent Outpatient Visits          7 months ago Hospital discharge follow-up   [place], NP   7 months ago Generalized anxiety disorder   [place], Satira Anis, MD   8 months ago Essential hypertension   [place], Satira Anis, MD   10 months ago Essential hypertension   [facility] [place], Satira Anis, MD   12 months ago Urinary frequency   [place], NP             Passed - Last Heart Rate in normal range    Pulse Readings from Last 1 Encounters:  07/25/18 92

## 2019-02-21 NOTE — Telephone Encounter (Signed)
Please schedule patient for follow up in the next 30 days.  

## 2019-03-09 ENCOUNTER — Telehealth: Payer: Self-pay | Admitting: Family Medicine

## 2019-03-10 NOTE — Telephone Encounter (Signed)
LVM TO SCH APPT °

## 2019-03-10 NOTE — Telephone Encounter (Signed)
Mailbox full: medication has been sent to pharmacy but pt need to schedule appt within the next month

## 2019-03-10 NOTE — Telephone Encounter (Signed)
Requested medication (s) are due for refill today: yes  Requested medication (s) are on the active medication list: yes  Last refill:  02/21/2019  Future visit scheduled: no  Notes to clinic:  Patient requesting 90 day supply   Requested Prescriptions  Pending Prescriptions Disp Refills   metoprolol succinate (TOPROL-XL) 50 MG 24 hr tablet [Pharmacy Med Name: METOPROLOL SUCC ER 50 MG TAB] 90 tablet 1    Sig: TAKE 1 TABLET (50 MG TOTAL) BY MOUTH DAILY. TAKE WITH OR IMMEDIATELY FOLLOWING A MEAL.     Cardiovascular:  Beta Blockers Failed - 03/09/2019  5:53 PM      Failed - Last BP in normal range    BP Readings from Last 1 Encounters:  07/25/18 140/78         Failed - Valid encounter within last 6 months    Recent Outpatient Visits          7 months ago Hospital discharge follow-up   [place], NP   8 months ago Generalized anxiety disorder   [place], Satira Anis, MD   8 months ago Essential hypertension   [facility] [place], Satira Anis, MD   11 months ago Essential hypertension   [facility] [place], Satira Anis, MD   1 year ago Urinary frequency   [place], NP             Passed - Last Heart Rate in normal range    Pulse Readings from Last 1 Encounters:  07/25/18 92

## 2019-03-11 IMAGING — CT CT HEAD W/O CM
3 series · 16 of 45 positions shown, 19 images · non-contrast
Comparison: 12/29/2017

CLINICAL DATA: Generalized weakness, nausea, vomiting, and
dizziness.

EXAM:
CT HEAD WITHOUT CONTRAST
TECHNIQUE: Contiguous axial images were obtained from the base of the skull
through the vertex without intravenous contrast.

[Series 3: head wo · axial · 0.40mm/px · z∈[-114,+1]mm · 10 of 28 slices shown, 13 images]
[im 3/28  brain]
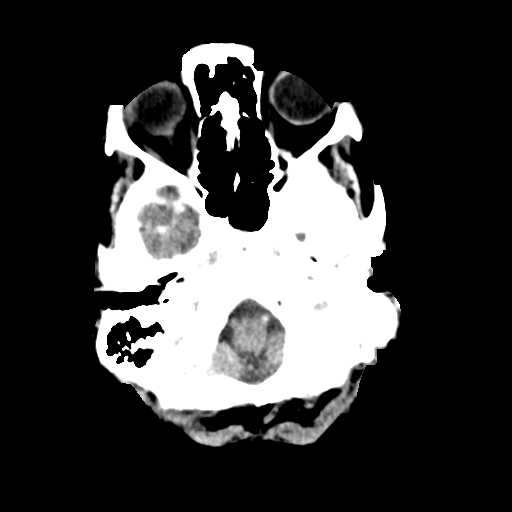
[im 3/28  bone]
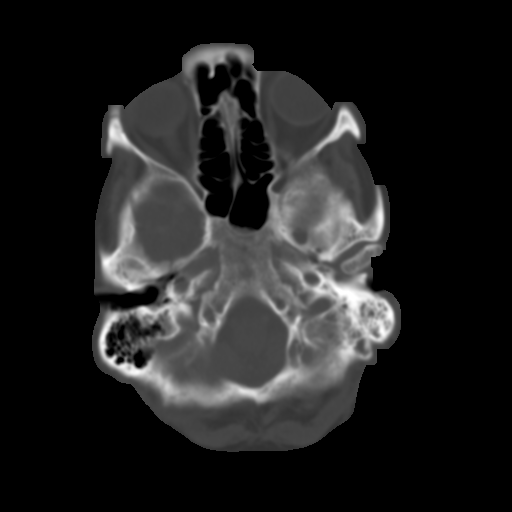
[im 5/28  brain]
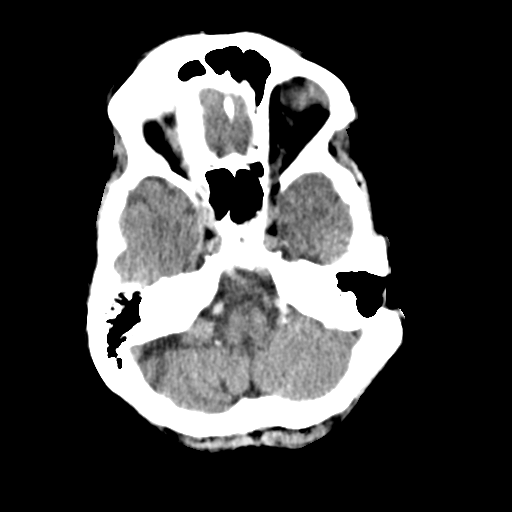
[im 8/28  brain]
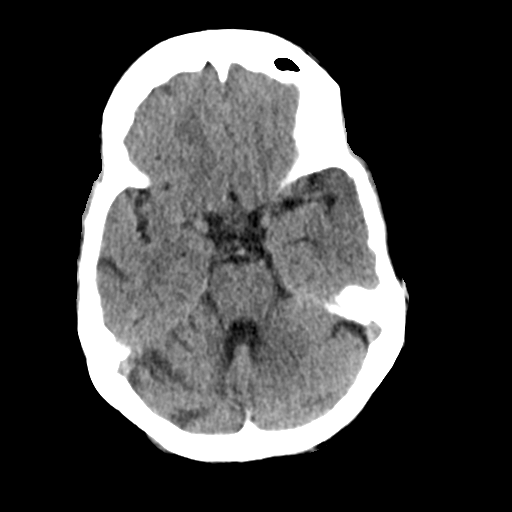
[im 11/28  brain]
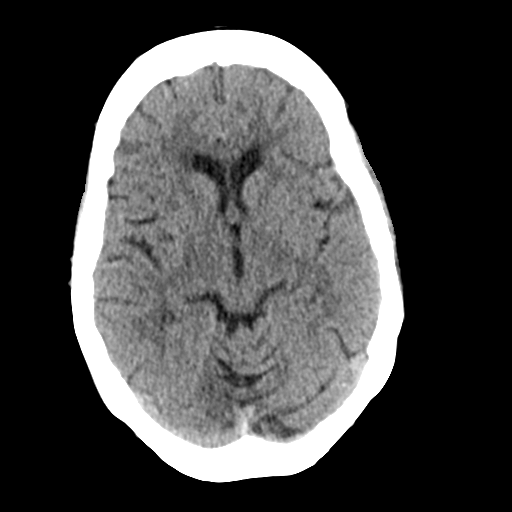
[im 13/28  brain]
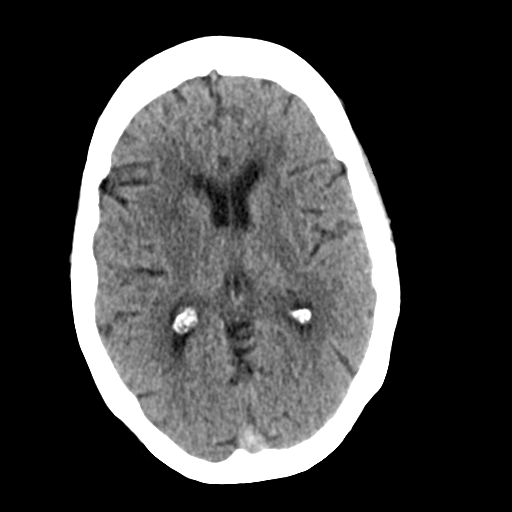
[im 13/28  bone]
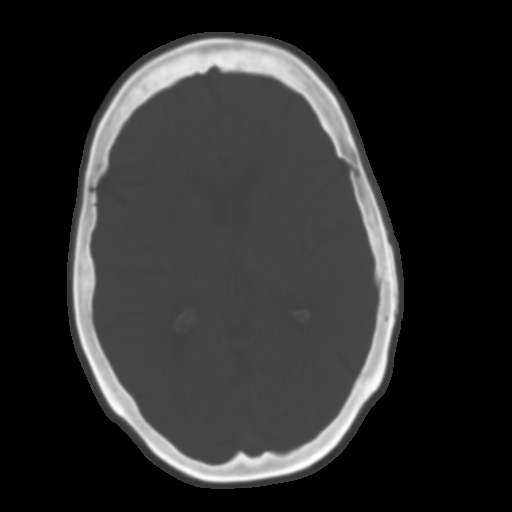
[im 16/28  brain]
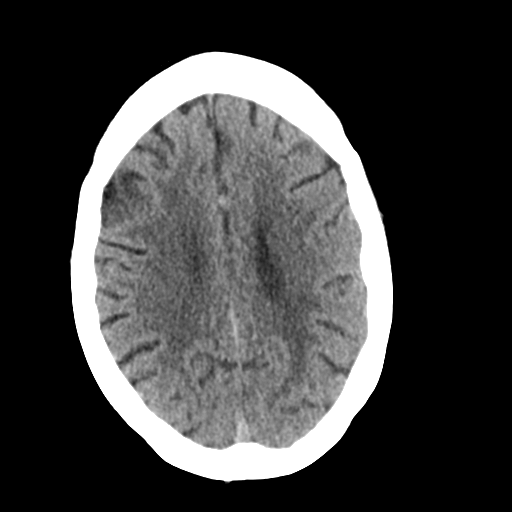
[im 18/28  brain]
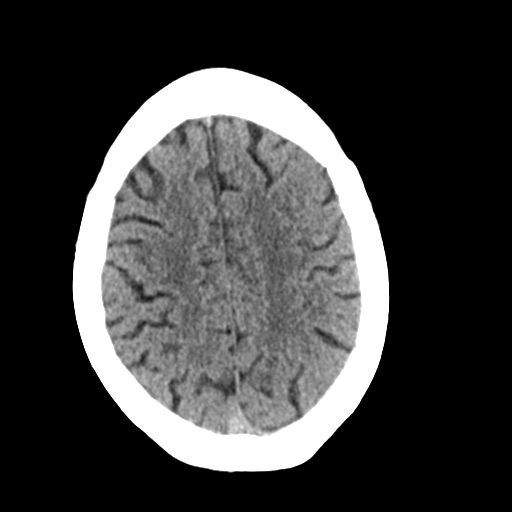
[im 21/28  brain]
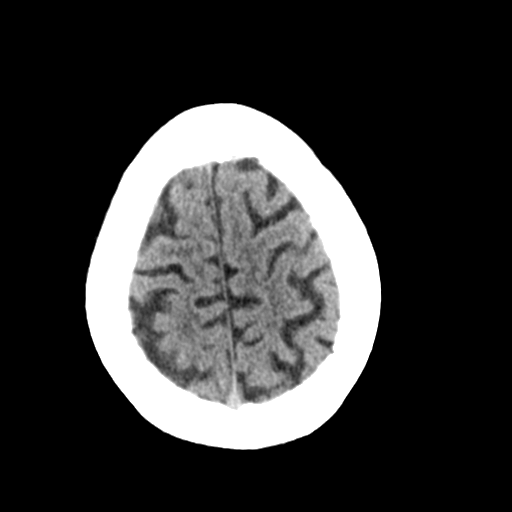
[im 24/28  brain]
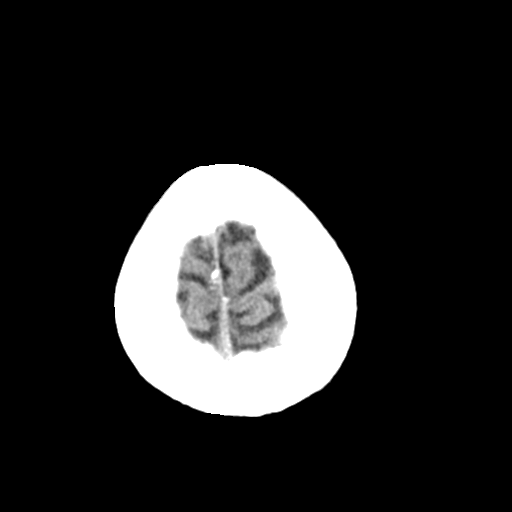
[im 24/28  bone]
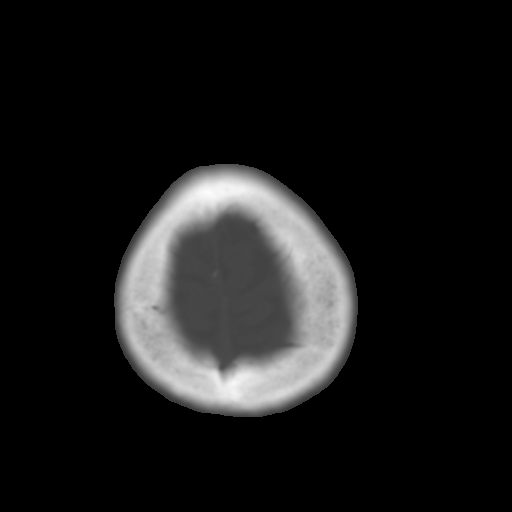
[im 26/28  brain]
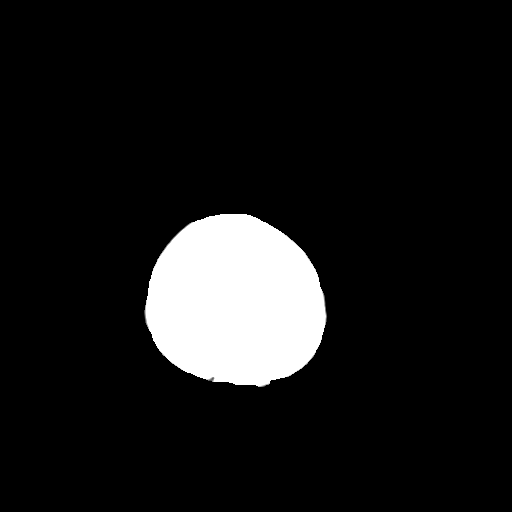

[Series 4: coronal soft tissue · coronal · 0.30mm/px · 3 of 64 slices shown]
[im 22/64  brain]
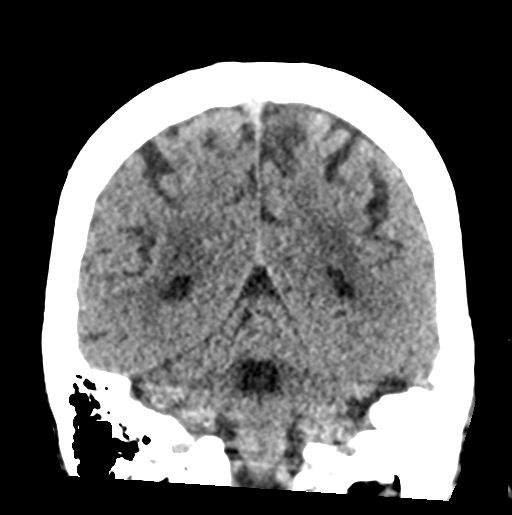
[im 29/64  brain]
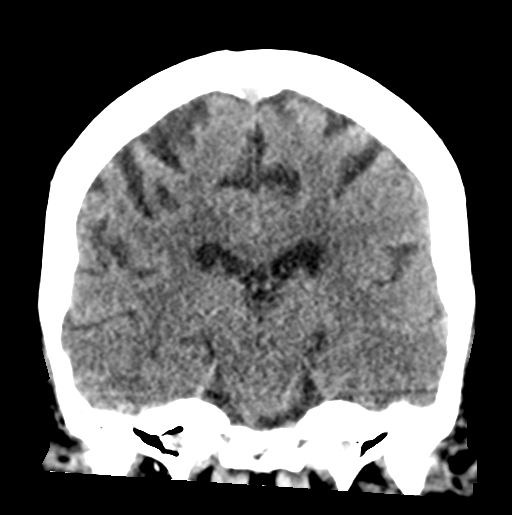
[im 36/64  brain]
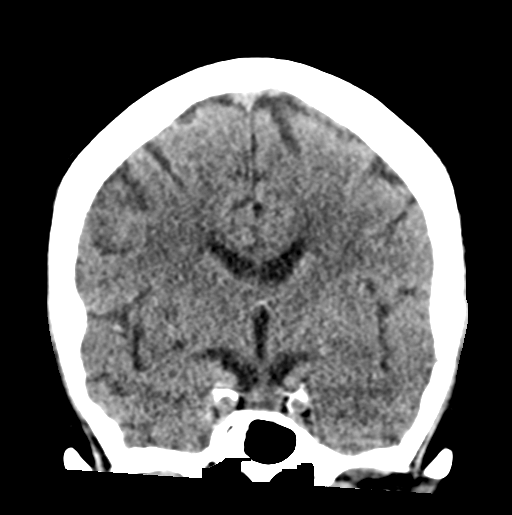

[Series 5: sagittal soft tissue · sagittal · 0.31mm/px · 3 of 51 slices shown]
[im 17/51  brain]
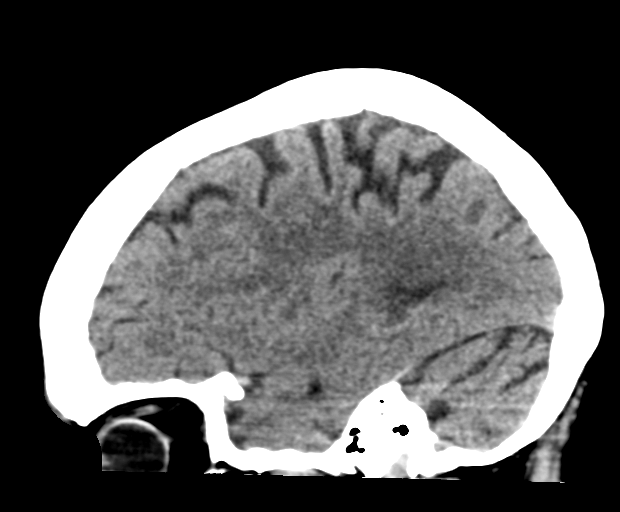
[im 26/51  brain]
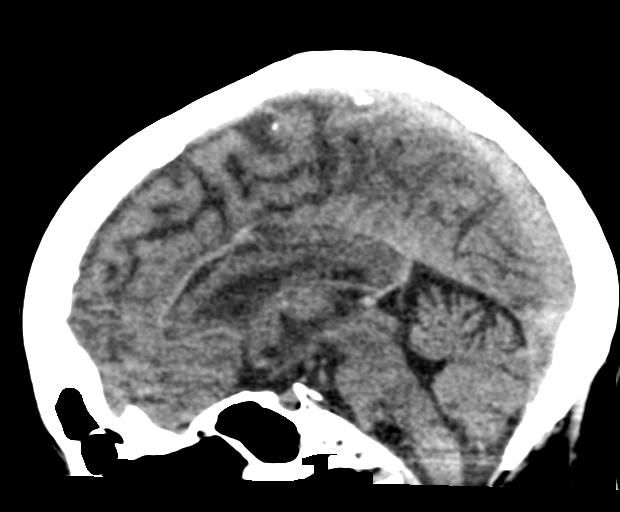
[im 34/51  brain]
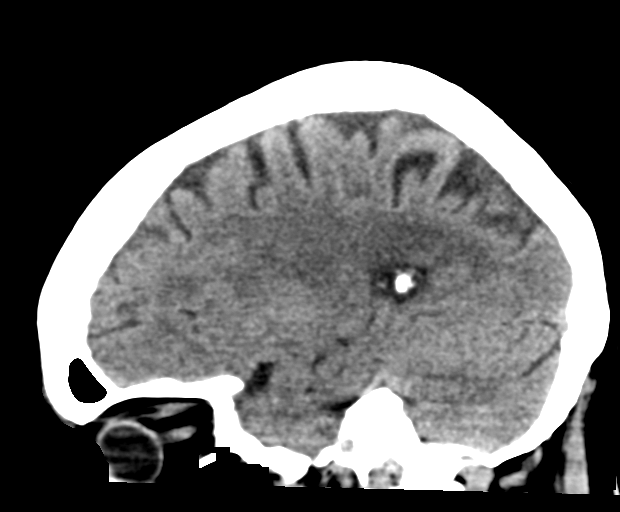

[16 of 45 positions shown; findings below may reference images not displayed]

FINDINGS: Brain: There is no evidence of acute infarct, intracranial
hemorrhage, mass, midline shift, or extra-axial fluid collection.
The ventricles and sulci are within normal limits for age. Small
chronic infarcts are again seen in the right lentiform nucleus and
right cerebellum. Patchy cerebral white matter hypodensities
bilaterally are similar to the prior study and nonspecific but
compatible with chronic small vessel ischemic disease, mild for age.

Vascular: No hyperdense vessel.

Skull: No fracture or suspicious osseous lesion.

Sinuses/Orbits: Visualized paranasal sinuses are clear. Prior left
mastoidectomy is again noted with unchanged opacification of
residual mastoid air cells. Right cataract extraction.

Other: None.
IMPRESSION: 1. No evidence of acute intracranial abnormality.
2. Mild chronic small vessel ischemic disease.

## 2019-03-13 ENCOUNTER — Other Ambulatory Visit: Payer: Self-pay | Admitting: Family Medicine

## 2019-03-13 MED ORDER — AMLODIPINE BESYLATE 5 MG PO TABS: ORAL_TABLET | ORAL | 0 refills | Status: DC

## 2019-03-13 NOTE — Telephone Encounter (Signed)
Medication Refill - Medication: amlodipine   Has the patient contacted their pharmacy? Yes.    (Agent: If no, request that the patient contact the pharmacy for the refill.) (Agent: If yes, when and what did the pharmacy advise?)  Preferred Pharmacy (with phone number or street name):  [id number] Tamara Sparks [address]  [address] [place] [address]  Phone: [phone] Fax: [phone]  Not a 24 hour pharmacy; exact hours not known.     Agent: Please be advised that RX refills may take up to 3 business days. We ask that you follow-up with your pharmacy.

## 2019-03-13 NOTE — Telephone Encounter (Signed)
Requested medication (s) are due for refill today: yes  Requested medication (s) are on the active medication list: yes  Last refill:  11/05/2018  Future visit scheduled: no  Notes to clinic:  Review for refill Overdue for office visit    Requested Prescriptions  Pending Prescriptions Disp Refills   amLODipine (NORVASC) 5 MG tablet 90 tablet 0    Sig: TAKE 1 TABLET(5 MG) BY MOUTH DAILY     Cardiovascular:  Calcium Channel Blockers Failed - 03/13/2019  8:58 AM      Failed - Last BP in normal range    BP Readings from Last 1 Encounters:  07/25/18 140/78         Failed - Valid encounter within last 6 months    Recent Outpatient Visits          7 months ago Hospital discharge follow-up   [place], NP   8 months ago Generalized anxiety disorder   [place], Satira Anis, MD   8 months ago Essential hypertension   [place], Satira Anis, MD   11 months ago Essential hypertension   [facility] [place], Satira Anis, MD   1 year ago Urinary frequency   [place], NP

## 2019-03-13 NOTE — Telephone Encounter (Signed)
Please schedule patient for follow up in the next 30 days.  

## 2019-04-01 ENCOUNTER — Other Ambulatory Visit: Payer: Self-pay

## 2019-04-01 ENCOUNTER — Encounter: Payer: Self-pay | Admitting: Family Medicine

## 2019-04-01 ENCOUNTER — Ambulatory Visit (INDEPENDENT_AMBULATORY_CARE_PROVIDER_SITE_OTHER): Payer: Medicare Other | Admitting: Family Medicine

## 2019-04-01 VITALS — Ht 61.0 in | Wt 110.0 lb

## 2019-04-01 DIAGNOSIS — I700 Atherosclerosis of aorta: Secondary | ICD-10-CM

## 2019-04-01 DIAGNOSIS — E785 Hyperlipidemia, unspecified: Secondary | ICD-10-CM | POA: Diagnosis not present

## 2019-04-01 DIAGNOSIS — F419 Anxiety disorder, unspecified: Secondary | ICD-10-CM

## 2019-04-01 DIAGNOSIS — I10 Essential (primary) hypertension: Secondary | ICD-10-CM

## 2019-04-01 MED ORDER — METOPROLOL SUCCINATE ER 50 MG PO TB24: 50.0000 mg | ORAL_TABLET | Freq: Every day | ORAL | 0 refills | Status: DC

## 2019-04-01 MED ORDER — ESCITALOPRAM OXALATE 5 MG PO TABS: 5.0000 mg | ORAL_TABLET | Freq: Every day | ORAL | 0 refills | Status: DC

## 2019-04-01 MED ORDER — AMLODIPINE BESYLATE 5 MG PO TABS: ORAL_TABLET | ORAL | 0 refills | Status: DC

## 2019-04-01 NOTE — Progress Notes (Signed)
Name: Tamara Sparks   MRN: [id number]    DOB: 08/26/1928   Date:04/01/2019       Progress Note  Subjective:    Chief Complaint  Chief Complaint  Patient presents with  . Follow-up  . Hypertension  . Hyperlipidemia  . Hypothyroidism  . Medication Refill    I connected with  [name] on 04/01/19 at  9:00 AM EST by telephone and verified that I am speaking with the correct person using two identifiers.  I discussed the limitations, risks, security and privacy concerns of performing an evaluation and management service by telephone and the availability of in person appointments. Staff also discussed with the patient that there may be a patient responsible charge related to this service. Patient Location: home Provider Location: [facility] Additional Individuals present: none  HPI   Pt presents via telephone call for med refills on HTN meds, HLD meds, and for depression and anxiety.    Patient is on metoprolol 50 mg extended release daily and amlodipine 5 mg daily for blood pressure control.  She denies any palpitations, shortness of breath, chest pain, near syncope, fatigue, exertional symptoms.  She is not monitoring her blood pressure at home because her daughter took away her blood pressure cuff because it was making her anxious.  She will have her daughter check it when she comes to the house.  She has not had any passing out episodes or known recent high or low blood pressure readings.  She is also taking simvastatin for hyperlipidemia, she denies any myalgias, claudication, chest pain She does eat a healthy diet.    She is also here for follow-up on Lexapro 5 mg daily, she was given this for anxiety and depression.  Her PHQ-9 screening questions are improved since her last visit which was in March, 8 months ago.  Patient is still anxious and does get worrisome at home also gets very lonely.  This is been a problem since her husband passed away.  She does get nervous and  anxious about her health her blood pressure and her vital signs.  She thinks the medicines are helping a little bit but she still feels anxious every day  Patient Active Problem List   Diagnosis Date Noted  . Hypertensive urgency 07/05/2018  . Abnormal ankle brachial index (ABI) 03/11/2018  . Aortic atherosclerosis (HCC) 01/20/2018  . Multiple renal cysts 01/20/2018  . History of TB (tuberculosis) 10/05/2017  . History of lobectomy of lung 10/05/2017  . Dyslipidemia 09/23/2015  . Hyperglycemia 09/23/2015  . Seasonal allergies 09/09/2015  . Dyslipidemia, goal LDL below 130 03/16/2015  . Anxiety disorder 12/08/2014  . Rapid heart beat 01/23/2014  . Diastolic murmur 01/23/2014  . Essential hypertension 01/23/2014    Past Surgical History:  Procedure Laterality Date  . CARDIAC CATHETERIZATION     [facility]  . CATARACT EXTRACTION W/PHACO Right 03/08/2016   Procedure: CATARACT EXTRACTION PHACO AND INTRAOCULAR LENS PLACEMENT (IOC);  Surgeon: Sallee Lange, MD;  Location: [facility] ORS;  Service: Ophthalmology;  Laterality: Right;   FLUID CASSETTE [id number] H, EXP 07/19/17 [place]    3:16.8   AP%   27.2 CDE 94.14  . EXTERNAL EAR SURGERY Left   . LOBECTOMY Right 1952   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17    Family History  Problem Relation Age of Onset  . Stroke Mother   . Hypertension Father   . Stroke Father   . Hypertension Sister   . Hypertension Brother   .  Cancer Brother        brain  . Hypertension Sister   . Hypertension Brother   . Cancer Brother        stomach    Social History   Socioeconomic History  . Marital status: Widowed    Spouse name: Duanne GuessDewey  . Number of children: 4  . Years of education: Not on file  . Highest education level: 9th grade  Occupational History  . Occupation: Retired  Engineer, productionocial Needs  . Financial resource strain: Not hard at all  . Food insecurity    Worry: Never true    Inability: Never true  . Transportation needs    Medical: No    Non-medical:  No  Tobacco Use  . Smoking status: Never Smoker  . Smokeless tobacco: Never Used  . Tobacco comment: smoking cessation materials not required  Substance and Sexual Activity  . Alcohol use: No    Alcohol/week: 0.0 standard drinks  . Drug use: No  . Sexual activity: Not Currently  Lifestyle  . Physical activity    Days per week: 0 days    Minutes per session: 0 min  . Stress: Rather much  Relationships  . Social Musicianconnections    Talks on phone: Patient refused    Gets together: Patient refused    Attends religious service: Patient refused    Active member of club or organization: Patient refused    Attends meetings of clubs or organizations: Patient refused    Relationship status: Widowed  . Intimate partner violence    Fear of current or ex partner: No    Emotionally abused: No    Physically abused: No    Forced sexual activity: No  Other Topics Concern  . Not on file  Social History Narrative  . Not on file     Current Outpatient Medications:  .  amLODipine (NORVASC) 5 MG tablet, TAKE 1 TABLET(5 MG) BY MOUTH DAILY, Disp: 30 tablet, Rfl: 0 .  escitalopram (LEXAPRO) 5 MG tablet, TAKE 1 TABLET BY MOUTH EVERY DAY, Disp: 90 tablet, Rfl: 0 .  LORazepam (ATIVAN) 0.5 MG tablet, Take 1 tablet (0.5 mg total) by mouth daily as needed for anxiety., Disp: 30 tablet, Rfl: 3 .  metoprolol succinate (TOPROL-XL) 50 MG 24 hr tablet, TAKE 1 TABLET (50 MG TOTAL) BY MOUTH DAILY. TAKE WITH OR IMMEDIATELY FOLLOWING A MEAL., Disp: 30 tablet, Rfl: 0 .  simvastatin (ZOCOR) 20 MG tablet, TAKE 1 TABLET(20 MG) BY MOUTH DAILY AT 6 PM FOR CHOLESTEROL, Disp: 90 tablet, Rfl: 1 .  aspirin 81 MG tablet, Take 81 mg by mouth daily., Disp: , Rfl:  .  Cholecalciferol (VITAMIN D3) 5000 UNITS CAPS, Take 1 capsule by mouth daily. , Disp: , Rfl:   Allergies  Allergen Reactions  . Sertraline   . Penicillins Rash    Has patient had a PCN reaction causing immediate rash, facial/tongue/throat swelling, SOB or  lightheadedness with hypotension: Yes Has patient had a PCN reaction causing severe rash involving mucus membranes or skin necrosis: No Has patient had a PCN reaction that required hospitalization: No Has patient had a PCN reaction occurring within the last 10 years: No If all of the above answers are "NO", then may proceed with Cephalosporin use.  . Sulfa Antibiotics Other (See Comments)    Pt does not remember but thinks that it just didn't agree with her    I personally reviewed active problem list, medication list, allergies, notes from last encounter, lab results  with the patient/caregiver today.  Review of Systems  Constitutional: Negative.   HENT: Negative.   Eyes: Negative.   Respiratory: Negative.   Cardiovascular: Negative.   Gastrointestinal: Negative.   Endocrine: Negative.   Genitourinary: Negative.   Musculoskeletal: Negative.   Skin: Negative.   Allergic/Immunologic: Negative.   Neurological: Negative.   Hematological: Negative.   Psychiatric/Behavioral: Negative.   All other systems reviewed and are negative.     Objective:    Virtual encounter, vitals limited, only able to obtain the following: Today's Vitals   04/01/19 0836  Weight: 110 lb (49.9 kg)  Height:  (1.549 m)   Body mass index is 20.78 kg/m. Nursing Note and Vital Signs reviewed.  Physical Exam Patient alert, answering questions appropriately, phonation clear, no auditory wheeze, stridor or respiratory distress, mood pleasant PE limited by telephone encounter  No results found for this or any previous visit (from the past 72 hour(s)).  PHQ2/9: Depression screen [facility] 2/9 04/01/2019 07/25/2018 06/24/2018 04/02/2018 01/01/2018  Decreased Interest 1 3 0 3 0  Down, Depressed, Hopeless 1 3 0 3 3  PHQ - 2 Score 2 6 0 6 3  Altered sleeping 0 0 0 0 0  Tired, decreased energy 0 3 0 0 1  Change in appetite 0 0 0 0 1  Feeling bad or failure about yourself  0 0 0 0 0  Trouble concentrating 0 0 0  0 0  Moving slowly or fidgety/restless 0 0 0 0 1  Suicidal thoughts 0 0 0 0 0  PHQ-9 Score 2 9 0 6 6  Difficult doing work/chores Not difficult at all Not difficult at all Not difficult at all Not difficult at all Not difficult at all  Some recent data might be hidden   PHQ-2/9 Result is negative.    Fall Risk: Fall Risk  04/01/2019 11/20/2018 07/25/2018 06/24/2018 04/02/2018  Falls in the past year? 0 0 0 0 1  Comment - - - - -  Number falls in past yr: 0 - - - 0  Injury with Fall? 0 - - - 0  Risk Factor Category  - - - - -  Risk for fall due to : - Impaired mobility - - -  Risk for fall due to: Comment - - - - -  Follow up - - - - -     Assessment and Plan:       ICD-10-CM   1. Essential hypertension  I10 amLODipine (NORVASC) 5 MG tablet    metoprolol succinate (TOPROL-XL) 50 MG 24 hr tablet   We will need to get vital signs to see how well controlled she is but she denies any concerning symptoms, she is very anxious when taking blood pressure  2. Dyslipidemia  E78.5    Compliant with medications, discussed how she can stop the medication due to her age which may help her avoid some side effects  3. Aortic atherosclerosis (HCC) Chronic I70.0    on medications, but due to age can consider discontinuing  4. Anxiety disorder, unspecified type  F41.9 escitalopram (LEXAPRO) 5 MG tablet   Lexapro helping somewhat with her mood but still fairly anxious consider switching to citalopram?  for now will keep the same until mtg pt in person     I discussed the assessment and treatment plan with the patient. The patient was provided an opportunity to ask questions and all were answered. The patient agreed with the plan and demonstrated an understanding of the  instructions.   The patient was advised to call back or seek an in-person evaluation if the symptoms worsen or if the condition fails to improve as anticipated.  I provided 11 minutes of non-face-to-face time during this encounter.   Delsa Grana, PA-C 04/01/19 9:32 AM

## 2019-04-02 ENCOUNTER — Other Ambulatory Visit: Payer: Self-pay | Admitting: Family Medicine

## 2019-04-02 ENCOUNTER — Encounter: Payer: Self-pay | Admitting: Family Medicine

## 2019-04-02 DIAGNOSIS — I10 Essential (primary) hypertension: Secondary | ICD-10-CM

## 2019-04-05 ENCOUNTER — Other Ambulatory Visit: Payer: Self-pay | Admitting: Family Medicine

## 2019-04-05 DIAGNOSIS — I10 Essential (primary) hypertension: Secondary | ICD-10-CM

## 2019-05-03 ENCOUNTER — Other Ambulatory Visit: Payer: Self-pay | Admitting: Family Medicine

## 2019-05-03 DIAGNOSIS — I10 Essential (primary) hypertension: Secondary | ICD-10-CM

## 2019-05-25 ENCOUNTER — Other Ambulatory Visit: Payer: Self-pay | Admitting: Family Medicine

## 2019-05-25 DIAGNOSIS — I10 Essential (primary) hypertension: Secondary | ICD-10-CM

## 2019-06-16 ENCOUNTER — Other Ambulatory Visit: Payer: Self-pay | Admitting: Family Medicine

## 2019-06-16 DIAGNOSIS — E785 Hyperlipidemia, unspecified: Secondary | ICD-10-CM

## 2019-07-08 ENCOUNTER — Other Ambulatory Visit: Payer: Self-pay

## 2019-07-08 DIAGNOSIS — I10 Essential (primary) hypertension: Secondary | ICD-10-CM

## 2019-07-08 MED ORDER — AMLODIPINE BESYLATE 5 MG PO TABS: ORAL_TABLET | ORAL | 1 refills | Status: DC

## 2019-07-08 NOTE — Telephone Encounter (Signed)
Hypertension medication request:  Last office visit pertaining to hypertension:  BP Readings from Last 3 Encounters:  07/25/18 140/78  07/08/18 (!) 160/56  07/02/18 (!) 182/86    Lab Results  Component Value Date   CREATININE 0.82 07/25/2018   BUN 10 07/25/2018   NA 137 07/25/2018   K 4.2 07/25/2018   CL 98 07/25/2018   CO2 30 07/25/2018     No follow-ups on file.\

## 2019-07-09 NOTE — Telephone Encounter (Signed)
Pt states will talk with her daughter and will see if she will bring her up her for bp check

## 2019-07-21 ENCOUNTER — Telehealth: Payer: Self-pay | Admitting: Family Medicine

## 2019-07-21 DIAGNOSIS — I10 Essential (primary) hypertension: Secondary | ICD-10-CM

## 2019-07-21 NOTE — Telephone Encounter (Signed)
Hypertension medication request:  Last office visit pertaining to hypertension:11/10  BP Readings from Last 3 Encounters:  07/25/18 140/78  07/08/18 (!) 160/56  07/02/18 (!) 182/86    Lab Results  Component Value Date   CREATININE 0.82 07/25/2018   BUN 10 07/25/2018   NA 137 07/25/2018   K 4.2 07/25/2018   CL 98 07/25/2018   CO2 30 07/25/2018     No follow-ups on file.

## 2019-07-21 NOTE — Telephone Encounter (Signed)
Pt is scheduled °

## 2019-07-23 ENCOUNTER — Other Ambulatory Visit: Payer: Self-pay

## 2019-07-23 ENCOUNTER — Ambulatory Visit (INDEPENDENT_AMBULATORY_CARE_PROVIDER_SITE_OTHER): Payer: Medicare Other | Admitting: Family Medicine

## 2019-07-23 ENCOUNTER — Encounter: Payer: Self-pay | Admitting: Family Medicine

## 2019-07-23 VITALS — BP 152/82 | HR 87 | Temp 97.8°F | Resp 14 | Wt 111.5 lb

## 2019-07-23 DIAGNOSIS — I10 Essential (primary) hypertension: Secondary | ICD-10-CM

## 2019-07-23 DIAGNOSIS — F419 Anxiety disorder, unspecified: Secondary | ICD-10-CM

## 2019-07-23 DIAGNOSIS — Z5181 Encounter for therapeutic drug level monitoring: Secondary | ICD-10-CM

## 2019-07-23 DIAGNOSIS — E785 Hyperlipidemia, unspecified: Secondary | ICD-10-CM | POA: Diagnosis not present

## 2019-07-23 MED ORDER — ESCITALOPRAM OXALATE 5 MG PO TABS: 5.0000 mg | ORAL_TABLET | Freq: Every day | ORAL | 3 refills | Status: DC

## 2019-07-23 MED ORDER — SIMVASTATIN 20 MG PO TABS: 20.0000 mg | ORAL_TABLET | Freq: Every day | ORAL | 3 refills | Status: DC

## 2019-07-23 MED ORDER — HYDRALAZINE HCL 25 MG PO TABS: 25.0000 mg | ORAL_TABLET | Freq: Three times a day (TID) | ORAL | 2 refills | Status: DC | PRN

## 2019-07-23 MED ORDER — AMLODIPINE BESYLATE 5 MG PO TABS: ORAL_TABLET | ORAL | 3 refills | Status: DC

## 2019-07-23 MED ORDER — METOPROLOL SUCCINATE ER 50 MG PO TB24: 50.0000 mg | ORAL_TABLET | Freq: Every day | ORAL | 3 refills | Status: DC

## 2019-07-23 NOTE — Progress Notes (Signed)
Name: Tamara Sparks   MRN: [id number]    DOB: 1929/03/29   Date:07/23/2019       Progress Note  Chief Complaint  Patient presents with  . Follow-up  . Anxiety  . Hyperlipidemia  . Hypertension     Subjective:   Tamara Sparks is a 86 y.o. female, presents to clinic for routine follow up on the conditions listed above.  Hypertension:  Currently managed on norvasc 5 mg and metoprolol 25 mg Pt reports good med compliance and denies any SE.  No lightheadedness, hypotension, syncope. Blood pressure today is elevated - she gets extremely nervous to check her BP at the DR office or at home, and rechecking only makes it worse, so they have tried to do less and less of that unless she feels like her BP is high. BP Readings from Last 3 Encounters:  07/23/19 (!) 152/82  07/25/18 140/78  07/08/18 (!) 160/56  Pt denies CP, SOB, exertional sx, LE edema, palpitation, Ha's, visual disturbances  Hyperlipidemia: Current Medication Regimen:simvastatin 20 mg Last Lipids: Total cholesterol 113, trigs 45, HDL 54, LDL 50 - Denies: Chest pain, shortness of breath, myalgias. - Documented aortic atherosclerosis? No - Risk factors for atherosclerosis: hypercholesterolemia and hypertension  She lives alone, she is here with her daughter, pt is HOH so her daughter helps with most of the discussion and HPI Pt denies any weight loss, states she has a good appetite.  Anxiety:  She states her "nerves" are fine, but she doesn't like to check her BP.  She has been taking lexapro for a long time and has not SE or concerns.  States her mood is good and she doesn't feel down or depressed.    Patient Active Problem List   Diagnosis Date Noted  . Hypertensive urgency 07/05/2018  . Abnormal ankle brachial index (ABI) 03/11/2018  . Aortic atherosclerosis (HCC) 01/20/2018  . Multiple renal cysts 01/20/2018  . History of TB (tuberculosis) 10/05/2017  . History of lobectomy of lung 10/05/2017  .  Dyslipidemia 09/23/2015  . Hyperglycemia 09/23/2015  . Seasonal allergies 09/09/2015  . Dyslipidemia, goal LDL below 130 03/16/2015  . Anxiety disorder 12/08/2014  . Rapid heart beat 01/23/2014  . Diastolic murmur 01/23/2014  . Essential hypertension 01/23/2014    Past Surgical History:  Procedure Laterality Date  . CARDIAC CATHETERIZATION     [facility]  . CATARACT EXTRACTION W/PHACO Right 03/08/2016   Procedure: CATARACT EXTRACTION PHACO AND INTRAOCULAR LENS PLACEMENT (IOC);  Surgeon: Sallee Lange, MD;  Location: [facility] ORS;  Service: Ophthalmology;  Laterality: Right;   FLUID CASSETTE [id number] H, EXP 07/19/17 [place]    3:16.8   AP%   27.2 CDE 94.14  . EXTERNAL EAR SURGERY Left   . LOBECTOMY Right 1952   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17    Family History  Problem Relation Age of Onset  . Stroke Mother   . Hypertension Father   . Stroke Father   . Hypertension Sister   . Hypertension Brother   . Cancer Brother        brain  . Hypertension Sister   . Hypertension Brother   . Cancer Brother        stomach    Social History   Tobacco Use  . Smoking status: Never Smoker  . Smokeless tobacco: Never Used  . Tobacco comment: smoking cessation materials not required  Substance Use Topics  . Alcohol use: No    Alcohol/week: 0.0 standard drinks  .  Drug use: No      Current Outpatient Medications:  .  amLODipine (NORVASC) 5 MG tablet, TAKE 1 TABLET BY MOUTH EVERY DAY, Disp: 90 tablet, Rfl: 1 .  escitalopram (LEXAPRO) 5 MG tablet, Take 1 tablet (5 mg total) by mouth daily., Disp: 90 tablet, Rfl: 0 .  metoprolol succinate (TOPROL-XL) 50 MG 24 hr tablet, TAKE 1 TABLET (50 MG TOTAL) BY MOUTH DAILY. TAKE WITH OR IMMEDIATELY FOLLOWING A MEAL., Disp: 30 tablet, Rfl: 0 .  simvastatin (ZOCOR) 20 MG tablet, TAKE 1 TABLET(20 MG) BY MOUTH DAILY AT 6 PM FOR CHOLESTEROL, Disp: 90 tablet, Rfl: 1 .  aspirin 81 MG tablet, Take 81 mg by mouth daily., Disp: , Rfl:  .  Cholecalciferol (VITAMIN D3)  5000 UNITS CAPS, Take 1 capsule by mouth daily. , Disp: , Rfl:   Allergies  Allergen Reactions  . Sertraline   . Penicillins Rash    Has patient had a PCN reaction causing immediate rash, facial/tongue/throat swelling, SOB or lightheadedness with hypotension: Yes Has patient had a PCN reaction causing severe rash involving mucus membranes or skin necrosis: No Has patient had a PCN reaction that required hospitalization: No Has patient had a PCN reaction occurring within the last 10 years: No If all of the above answers are "NO", then may proceed with Cephalosporin use.  . Sulfa Antibiotics Other (See Comments)    Pt does not remember but thinks that it just didn't agree with her    Chart Review Today: I personally reviewed active problem list, medication list, allergies, family history, social history, health maintenance, notes from last encounter, lab results, imaging with the patient/caregiver today.  Review of Systems  10 Systems reviewed and are negative for acute change except as noted in the HPI.  Objective:    Vitals:   07/23/19 1029 07/23/19 1047  BP: (!) 172/80 (!) 152/82  Pulse: 87   Resp: 14   Temp: 97.8 F (36.6 C)   SpO2: 97%   Weight: 111 lb 8 oz (50.6 kg)     Body mass index is 21.07 kg/m.  Physical Exam Vitals and nursing note reviewed.  Constitutional:      General: She is not in acute distress.    Appearance: Normal appearance. She is well-developed. She is not ill-appearing, toxic-appearing or diaphoretic.     Interventions: Face mask in place.     Comments: Pleasant elderly female, well appearing  HENT:     Head: Normocephalic and atraumatic.     Right Ear: External ear normal.     Left Ear: External ear normal.  Eyes:     General: Lids are normal. No scleral icterus.       Right eye: No discharge.        Left eye: No discharge.     Conjunctiva/sclera: Conjunctivae normal.  Neck:     Trachea: Phonation normal. No tracheal deviation.    Cardiovascular:     Rate and Rhythm: Normal rate and regular rhythm.     Pulses: Normal pulses.          Radial pulses are 2+ on the right side and 2+ on the left side.       Posterior tibial pulses are 2+ on the right side and 2+ on the left side.     Heart sounds: Normal heart sounds. No murmur. No friction rub. No gallop.   Pulmonary:     Effort: Pulmonary effort is normal. No respiratory distress.     Breath  sounds: Normal breath sounds. No stridor. No wheezing, rhonchi or rales.  Chest:     Chest wall: No tenderness.  Abdominal:     General: Bowel sounds are normal. There is no distension.     Palpations: Abdomen is soft.     Tenderness: There is no abdominal tenderness. There is no guarding or rebound.  Musculoskeletal:        General: No deformity. Normal range of motion.     Cervical back: Normal range of motion and neck supple.     Right lower leg: No edema.     Left lower leg: No edema.  Lymphadenopathy:     Cervical: No cervical adenopathy.  Skin:    General: Skin is warm and dry.     Capillary Refill: Capillary refill takes less than 2 seconds.     Coloration: Skin is not jaundiced or pale.     Findings: No rash.  Neurological:     Mental Status: She is alert and oriented to person, place, and time.     Motor: No abnormal muscle tone.     Gait: Gait normal.  Psychiatric:        Mood and Affect: Mood normal.        Speech: Speech normal.        Behavior: Behavior normal.      PHQ2/9: Depression screen [facility] 2/9 07/23/2019 04/01/2019 07/25/2018 06/24/2018 04/02/2018  Decreased Interest 1 1 3  0 3  Down, Depressed, Hopeless 1 1 3  0 3  PHQ - 2 Score 2 2 6  0 6  Altered sleeping 1 0 0 0 0  Tired, decreased energy 0 0 3 0 0  Change in appetite 0 0 0 0 0  Feeling bad or failure about yourself  0 0 0 0 0  Trouble concentrating 0 0 0 0 0  Moving slowly or fidgety/restless 0 0 0 0 0  Suicidal thoughts 0 0 0 0 0  PHQ-9 Score 3 2 9  0 6  Difficult doing work/chores - Not  difficult at all Not difficult at all Not difficult at all Not difficult at all  Some recent data might be hidden    phq 9 is neg, less than 4, reviewed today Fall Risk: Fall Risk  07/23/2019 04/01/2019 11/20/2018 07/25/2018 06/24/2018  Falls in the past year? 0 0 0 0 0  Comment - - - - -  Number falls in past yr: 0 0 - - -  Injury with Fall? 0 0 - - -  Risk Factor Category  - - - - -  Risk for fall due to : - - Impaired mobility - -  Risk for fall due to: Comment - - - - -  Follow up - - - - -    Functional Status Survey: Is the patient deaf or have difficulty hearing?: Yes Does the patient have difficulty seeing, even when wearing glasses/contacts?: Yes Does the patient have difficulty concentrating, remembering, or making decisions?: No Does the patient have difficulty walking or climbing stairs?: Yes Does the patient have difficulty dressing or bathing?: No Does the patient have difficulty doing errands alone such as visiting a doctor's office or shopping?: Yes   Assessment & Plan:     ICD-10-CM   1. Essential hypertension  I10 metoprolol succinate (TOPROL-XL) 50 MG 24 hr tablet    amLODipine (NORVASC) 5 MG tablet    hydrALAZINE (APRESOLINE) 25 MG tablet    COMPLETE METABOLIC PANEL WITH GFR   elevated today,  may be due to anxiety, hx of labile BP, no current sx, continue norvasc and metoprolol, add hydralazine PRN to keep BP <160/100  2. Anxiety disorder, unspecified type  F41.9 escitalopram (LEXAPRO) 5 MG tablet   Pt states her mood is good stable, well controlled per pt, continue lexapro  3. Dyslipidemia  E78.5 COMPLETE METABOLIC PANEL WITH GFR    Lipid panel    rosuvastatin (CRESTOR) 10 MG tablet    DISCONTINUED: simvastatin (ZOCOR) 20 MG tablet   tolerating statin, last lipids well controlled, discussed continuing vs d/c for her age, with intermittently high BP and no med concerns, will continue statin  4. Encounter for medication monitoring  Z51.81 CBC with  Differential/Platelet    COMPLETE METABOLIC PANEL WITH GFR    Lipid panel    Return for 1-2 months f/up virtual visit for HTN Daughter [name] [phone].   Danelle Berry, PA-C 07/23/19 10:50 AM

## 2019-07-24 ENCOUNTER — Other Ambulatory Visit: Payer: Self-pay | Admitting: Family Medicine

## 2019-07-24 LAB — LIPID PANEL
Cholesterol: 257 mg/dL — ABNORMAL HIGH (ref <200)
HDL: 50 mg/dL (ref >=50)
LDL Cholesterol (Calc): 165 mg/dL (calc) — ABNORMAL HIGH
Non-HDL Cholesterol (Calc): 207 mg/dL (calc) — ABNORMAL HIGH (ref <130)
Total CHOL/HDL Ratio: 5.1 (calc) — ABNORMAL HIGH (ref <5.0)
Triglycerides: 254 mg/dL — ABNORMAL HIGH (ref <150)

## 2019-07-24 LAB — CBC WITH DIFFERENTIAL/PLATELET
Absolute Monocytes: 697 cells/uL (ref 200–950)
Basophils Absolute: 83 cells/uL (ref 0–200)
Basophils Relative: 0.8 %
Eosinophils Absolute: 229 cells/uL (ref 15–500)
Eosinophils Relative: 2.2 %
HCT: 44.4 % (ref 35.0–45.0)
Hemoglobin: 15 g/dL (ref 11.7–15.5)
Lymphs Abs: 1425 cells/uL (ref 850–3900)
MCH: 30 pg (ref 27.0–33.0)
MCHC: 33.8 g/dL (ref 32.0–36.0)
MCV: 88.8 fL (ref 80.0–100.0)
MPV: 10.3 fL (ref 7.5–12.5)
Monocytes Relative: 6.7 %
Neutro Abs: 7966 cells/uL — ABNORMAL HIGH (ref 1500–7800)
Neutrophils Relative %: 76.6 %
Platelets: 334 10*3/uL (ref 140–400)
RBC: 5 10*6/uL (ref 3.80–5.10)
RDW: 12.7 % (ref 11.0–15.0)
Total Lymphocyte: 13.7 %
WBC: 10.4 10*3/uL (ref 3.8–10.8)

## 2019-07-24 LAB — COMPLETE METABOLIC PANEL WITH GFR
AG Ratio: 1.3 (calc) (ref 1.0–2.5)
ALT: 10 U/L (ref 6–29)
AST: 19 U/L (ref 10–35)
Albumin: 4.3 g/dL (ref 3.6–5.1)
Alkaline phosphatase (APISO): 88 U/L (ref 37–153)
BUN: 11 mg/dL (ref 7–25)
CO2: 28 mmol/L (ref 20–32)
Calcium: 10.2 mg/dL (ref 8.6–10.4)
Chloride: 101 mmol/L (ref 98–110)
Creat: 0.84 mg/dL (ref 0.60–0.88)
GFR, Est African American: 72 mL/min/{1.73_m2} (ref >=60)
GFR, Est Non African American: 62 mL/min/{1.73_m2} (ref >=60)
Globulin: 3.3 g/dL (calc) (ref 1.9–3.7)
Glucose, Bld: 105 mg/dL — ABNORMAL HIGH (ref 65–99)
Potassium: 4.2 mmol/L (ref 3.5–5.3)
Sodium: 140 mmol/L (ref 135–146)
Total Bilirubin: 0.5 mg/dL (ref 0.2–1.2)
Total Protein: 7.6 g/dL (ref 6.1–8.1)

## 2019-07-24 MED ORDER — ROSUVASTATIN CALCIUM 10 MG PO TABS: 10.0000 mg | ORAL_TABLET | Freq: Every day | ORAL | 3 refills | Status: DC

## 2019-08-26 ENCOUNTER — Telehealth: Payer: Self-pay | Admitting: Family Medicine

## 2019-08-26 NOTE — Telephone Encounter (Signed)
Left message for patient to schedule Annual Wellness Visit.  Please schedule with Nurse Health Advisor Victoria Britt, RN at [facility] [facility]  

## 2019-09-23 ENCOUNTER — Other Ambulatory Visit: Payer: Self-pay

## 2019-09-23 ENCOUNTER — Telehealth: Payer: Medicare Other | Admitting: Family Medicine

## 2019-09-26 ENCOUNTER — Telehealth: Payer: Self-pay | Admitting: Family Medicine

## 2019-09-26 NOTE — Telephone Encounter (Signed)
Left message for patient to schedule Annual Wellness Visit.  Please schedule with Nurse Health Advisor Reather Littler, RN.   Disregard telephone message 08/26/19

## 2019-10-06 ENCOUNTER — Encounter: Payer: Self-pay | Admitting: Family Medicine

## 2019-10-06 ENCOUNTER — Ambulatory Visit (INDEPENDENT_AMBULATORY_CARE_PROVIDER_SITE_OTHER): Payer: Medicare Other | Admitting: Family Medicine

## 2019-10-06 VITALS — Ht 61.0 in | Wt 111.0 lb

## 2019-10-06 DIAGNOSIS — I10 Essential (primary) hypertension: Secondary | ICD-10-CM | POA: Diagnosis not present

## 2019-10-06 DIAGNOSIS — Z5181 Encounter for therapeutic drug level monitoring: Secondary | ICD-10-CM

## 2019-10-06 DIAGNOSIS — E785 Hyperlipidemia, unspecified: Secondary | ICD-10-CM

## 2019-10-06 DIAGNOSIS — I700 Atherosclerosis of aorta: Secondary | ICD-10-CM

## 2019-10-06 DIAGNOSIS — F320 Major depressive disorder, single episode, mild: Secondary | ICD-10-CM | POA: Diagnosis not present

## 2019-10-06 NOTE — Progress Notes (Signed)
Name: Tamara Sparks   MRN: [id number]    DOB: [birth year]   Date:10/06/2019       Progress Note  Subjective:    Chief Complaint  Chief Complaint  Patient presents with  . Follow-up  . Hypertension  . Hyperlipidemia    I connected with  [name] on 10/06/19 at 11:20 AM EDT by telephone and verified that I am speaking with the correct person using two identifiers.  I discussed the limitations, risks, security and privacy concerns of performing an evaluation and management service by telephone and the availability of in person appointments. Staff also discussed with the patient that there may be a patient responsible charge related to this service. Patient Location: home Provider Location: [facility]  Additional Individuals present: none  HPI  Hypertension:  Currently managed on norvasc 5 mg and metoprolol 50 mg  Pt reports good med compliance and denies any SE.  No lightheadedness, hypotension, syncope. Blood pressure - no reading today, only one blood pressure reading in the past 15 months, blood pressure was elevated at that time and patient does not have a way to come into the office due to depending on her daughter for transportation and she does not have a blood pressure cuff at home.  She cannot think of anybody she can ask to borrow or help her check her blood pressure. BP Readings from Last 3 Encounters:  07/23/19 (!) 152/82  07/25/18 140/78  07/08/18 (!) 160/56  Pt denies CP, SOB, exertional sx, palpitation, Ha's, visual disturbances She has had a little bit of swelling in her legs since starting amlodipine but it does not bother her its never cause pain, rash, wounds -mild swelling Previously pt and daughter reported - "She is not monitoring her blood pressure at home because her daughter took away her blood pressure cuff because it was making her anxious.  She will have her daughter check it when she comes to the house.  She has not had any passing out episodes or  known recent high or low blood pressure readings."  Daughter is not present to relay and BP readings known by her that are not known by the pt.  Hyperlipidemia: Current Medication Regimen:  She wanted to restart crestor after her cholesterol increased with last labs, was told by her past PCP that in her 85s she did not need to continue to be on a statin and the medication had been discontinued.  Patient is concerned because her mother and her father both had high blood pressure and had strokes.  She is not having any side effects since restarting the medication she is taking Crestor 10 mg daily she denies myalgias, we discussed statin benefits versus side effects and risk and at this time patient would like to continue to take the Crestor Last Lipids: Lab Results  Component Value Date   CHOL 257 (H) 07/23/2019   HDL 50 07/23/2019   LDLCALC 165 (H) 07/23/2019   TRIG 254 (H) 07/23/2019   CHOLHDL 5.1 (H) 07/23/2019    MDD and anxiety: Taking lexapro 5 mg, she has continued to take it daily she does not have any concerns, side effects or complaints today.  PHQ-9 screening has been stable since last office visit and still improved since patient was extremely anxious and depressed about a year ago   Patient Active Problem List   Diagnosis Date Noted  . Hypertensive urgency 07/05/2018  . Abnormal ankle brachial index (ABI) 03/11/2018  . Aortic atherosclerosis (HCC)  01/20/2018  . Multiple renal cysts 01/20/2018  . History of TB (tuberculosis) 10/05/2017  . History of lobectomy of lung 10/05/2017  . Dyslipidemia 09/23/2015  . Hyperglycemia 09/23/2015  . Seasonal allergies 09/09/2015  . Dyslipidemia, goal LDL below 130 03/16/2015  . Anxiety disorder 12/08/2014  . Rapid heart beat 01/23/2014  . Diastolic murmur 01/23/2014  . Essential hypertension 01/23/2014    Current Outpatient Medications:  .  amLODipine (NORVASC) 5 MG tablet, TAKE 1 TABLET BY MOUTH EVERY DAY, Disp: 90 tablet, Rfl: 3 .   Cholecalciferol (VITAMIN D3) 5000 UNITS CAPS, Take 1 capsule by mouth daily. , Disp: , Rfl:  .  escitalopram (LEXAPRO) 5 MG tablet, Take 1 tablet (5 mg total) by mouth daily., Disp: 90 tablet, Rfl: 3 .  hydrALAZINE (APRESOLINE) 25 MG tablet, Take 1 tablet (25 mg total) by mouth 3 (three) times daily as needed (for blood pressure > 160/100)., Disp: 90 tablet, Rfl: 2 .  metoprolol succinate (TOPROL-XL) 50 MG 24 hr tablet, Take 1 tablet (50 mg total) by mouth daily. Take with or immediately following a meal., Disp: 90 tablet, Rfl: 3 .  rosuvastatin (CRESTOR) 10 MG tablet, Take 1 tablet (10 mg total) by mouth daily., Disp: 90 tablet, Rfl: 3 Allergies  Allergen Reactions  . Sertraline   . Penicillins Rash    Has patient had a PCN reaction causing immediate rash, facial/tongue/throat swelling, SOB or lightheadedness with hypotension: Yes Has patient had a PCN reaction causing severe rash involving mucus membranes or skin necrosis: No Has patient had a PCN reaction that required hospitalization: No Has patient had a PCN reaction occurring within the last 10 years: No If all of the above answers are "NO", then may proceed with Cephalosporin use.  . Sulfa Antibiotics Other (See Comments)    Pt does not remember but thinks that it just didn't agree with her    Past Surgical History:  Procedure Laterality Date  . CARDIAC CATHETERIZATION     [facility]  . CATARACT EXTRACTION W/PHACO Right 03/08/2016   Procedure: CATARACT EXTRACTION PHACO AND INTRAOCULAR LENS PLACEMENT (IOC);  Surgeon: Sallee Lange, MD;  Location: [facility] ORS;  Service: Ophthalmology;  Laterality: Right;   FLUID CASSETTE [id number] H, EXP 07/19/17 [place]    3:16.8   AP%   27.2 CDE 94.14  . EXTERNAL EAR SURGERY Left   . LOBECTOMY Right 1952   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17   Family History  Problem Relation Age of Onset  . Stroke Mother   . Hypertension Father   . Stroke Father   . Hypertension Sister   . Hypertension Brother   . Cancer  Brother        brain  . Hypertension Sister   . Hypertension Brother   . Cancer Brother        stomach   Social History   Socioeconomic History  . Marital status: Widowed    Spouse name: [name]  . Number of children: 4  . Years of education: Not on file  . Highest education level: 9th grade  Occupational History  . Occupation: Retired  Tobacco Use  . Smoking status: Never Smoker  . Smokeless tobacco: Never Used  . Tobacco comment: smoking cessation materials not required  Substance and Sexual Activity  . Alcohol use: No    Alcohol/week: 0.0 standard drinks  . Drug use: No  . Sexual activity: Not Currently  Other Topics Concern  . Not on file  Social History Narrative  . Not  on file   Social Determinants of Health   Financial Resource Strain:   . Difficulty of Paying Living Expenses:   Food Insecurity:   . Worried About Charity fundraiser in the Last Year:   . Arboriculturist in the Last Year:   Transportation Needs:   . Film/video editor (Medical):   Marland Kitchen Lack of Transportation (Non-Medical):   Physical Activity:   . Days of Exercise per Week:   . Minutes of Exercise per Session:   Stress:   . Feeling of Stress :   Social Connections:   . Frequency of Communication with Friends and Family:   . Frequency of Social Gatherings with Friends and Family:   . Attends Religious Services:   . Active Member of Clubs or Organizations:   . Attends Archivist Meetings:   Marland Kitchen Marital Status:   Intimate Partner Violence:   . Fear of Current or Ex-Partner:   . Emotionally Abused:   Marland Kitchen Physically Abused:   . Sexually Abused:     Chart Review Today: I personally reviewed active problem list, medication list, allergies, family history, social history, health maintenance, notes from last encounter, lab results, imaging with the patient/caregiver today.  Review of Systems 10 Systems reviewed and are negative for acute change except as noted in the HPI.    Objective:    Virtual encounter, vitals limited, only able to obtain the following: Today's Vitals   10/06/19 1109  Weight: 111 lb (50.3 kg)  Height: 5\' 1"  (1.549 m)   Body mass index is 20.97 kg/m. Nursing Note and Vital Signs reviewed.  Physical Exam Pt alert, phonation clear, answering questions appropriately PE limited by telephone encounter   PHQ2/9: Depression screen [facility] 2/9 10/06/2019 07/23/2019 04/01/2019 07/25/2018 06/24/2018  Decreased Interest 0 1 1 3  0  Down, Depressed, Hopeless 1 1 1 3  0  PHQ - 2 Score 1 2 2 6  0  Altered sleeping 0 1 0 0 0  Tired, decreased energy 0 0 0 3 0  Change in appetite 0 0 0 0 0  Feeling bad or failure about yourself  1 0 0 0 0  Trouble concentrating 1 0 0 0 0  Moving slowly or fidgety/restless 0 0 0 0 0  Suicidal thoughts 0 0 0 0 0  PHQ-9 Score 3 3 2 9  0  Difficult doing work/chores Not difficult at all - Not difficult at all Not difficult at all Not difficult at all  Some recent data might be hidden   PHQ-2/9 Result is neg, less than 4  Fall Risk: Fall Risk  10/06/2019 07/23/2019 04/01/2019 11/20/2018 07/25/2018  Falls in the past year? 0 0 0 0 0  Comment - - - - -  Number falls in past yr: 0 0 0 - -  Injury with Fall? 0 0 0 - -  Risk Factor Category  - - - - -  Risk for fall due to : - - - Impaired mobility -  Risk for fall due to: Comment - - - - -  Follow up - - - - -     Assessment and Plan:     ICD-10-CM   1. Essential hypertension  F75 COMPLETE METABOLIC PANEL WITH GFR   unable to assess BP w/o reading and with telephone visit - pt is compliant with meds, has not sx or concerns, last BP was elevated, needs in person OV/VS  2. Dyslipidemia  Z02.5 COMPLETE METABOLIC PANEL WITH GFR  Lipid panel   Patient prefers to take and continue taking a statin, we have discussed her age and options to discontinue statin, she is taking daily, no myalgias  3. Aortic atherosclerosis (HCC) Chronic I70.0 COMPLETE METABOLIC PANEL WITH GFR     Lipid panel   pt off asa, but wanted to resume statin - due for repeat labs in ~3 weeks since restarted meds  4. MDD (major depressive disorder), single episode, mild (HCC)  F32.0    taking lexapro 5 mg daily for MDD and anxiety, stable MDD, pt did not seem anxious today, continue meds  5. Encounter for medication monitoring  Z51.81 COMPLETE METABOLIC PANEL WITH GFR    Lipid panel    CBC with Differential/Platelet     I discussed the assessment and treatment plan with the patient. The patient was provided an opportunity to ask questions and all were answered. The patient agreed with the plan and demonstrated an understanding of the instructions.   The patient was advised to call back or seek an in-person evaluation if the symptoms worsen or if the condition fails to improve as anticipated.  I provided 20 minutes of non-face-to-face time during this encounter.  Danelle Berry, PA-C 10/06/19 12:18 PM

## 2019-10-23 ENCOUNTER — Ambulatory Visit (INDEPENDENT_AMBULATORY_CARE_PROVIDER_SITE_OTHER): Payer: Medicare Other

## 2019-10-23 VITALS — Ht 61.0 in | Wt 112.0 lb

## 2019-10-23 DIAGNOSIS — Z0000 Encounter for general adult medical examination without abnormal findings: Secondary | ICD-10-CM

## 2019-10-23 NOTE — Progress Notes (Signed)
Subjective:   Tamara Sparks is a 86 y.o. female who presents for Medicare Annual (Subsequent) preventive examination.  Virtual Visit via Telephone Note  I connected with  Tamara Sparks on 10/23/19 at  2:10 PM EDT by telephone and verified that I am speaking with the correct person using two identifiers.  Medicare Annual Wellness visit completed telephonically due to Covid-19 pandemic.   Location: Patient: home Provider: office   I discussed the limitations, risks, security and privacy concerns of performing an evaluation and management service by telephone and the availability of in person appointments. The patient expressed understanding and agreed to proceed.  Unable to perform video visit due to patient does not have video capability.   Some vital signs may be absent or patient reported.   Clemetine Marker, LPN    Review of Systems:   Cardiac Risk Factors include: advanced age (>28men, >56 women);hypertension;sedentary lifestyle     Objective:     Vitals: Ht 5\' 1"  (1.549 m)   Wt 112 lb (50.8 kg)   BMI 21.16 kg/m   Body mass index is 21.16 kg/m.  Advanced Directives 10/23/2019 11/20/2018 07/05/2018 12/29/2017 09/04/2017 12/18/2016 12/18/2016  Does Patient Have a Medical Advance Directive? Yes Yes No No No No No  Type of Paramedic of [place];Living will [place]  Does patient want to make changes to medical advance directive? - No - Patient declined - - - - -  Copy of [place] in Chart? Yes - validated most recent copy scanned in chart (See row information) Yes - validated most recent copy scanned in chart (See row information) - - - - -  Would patient like information on creating a medical advance directive? - - No - Patient declined - Yes (MAU/Ambulatory/Procedural Areas - Information given) No - Patient declined -    Tobacco Social History   Tobacco Use  Smoking Status Never Smoker    Smokeless Tobacco Never Used  Tobacco Comment   smoking cessation materials not required     Counseling given: Not Answered Comment: smoking cessation materials not required   Clinical Intake:  Pre-visit preparation completed: Yes  Pain : No/denies pain     BMI - recorded: 21.16 Nutritional Status: BMI of 19-24  Normal Nutritional Risks: None Diabetes: No  How often do you need to have someone help you when you read instructions, pamphlets, or other written materials from your doctor or pharmacy?: 1 - Never  Interpreter Needed?: No  Information entered by :: Clemetine Marker LPN  Past Medical History:  Diagnosis Date  . Anxiety state, unspecified   . Cardiac dysrhythmia, unspecified   . Depression   . Dyslipidemia, goal LDL below 130   . Dyspnea    DOE, patient does alot of work for living alone  . Dysrhythmia    patient unsure of this  . Heart murmur   . History of stroke    Right cerebellar intracerebral hemorrhage  . HOH (hard of hearing)   . Hyposmolality and/or hyponatremia   . Stroke [facility]) 2012   right hand shakes and can't write well  . Tremors of nervous system    RIGHT ARM  . Tuberculosis 1947   RIGHT UPPER LOBE EXCISION AGE 38  . Unspecified essential hypertension   . Unspecified vitamin D deficiency   . Urinary tract infection, site not specified    Past Surgical History:  Procedure Laterality Date  .  CARDIAC CATHETERIZATION     [facility]  . CATARACT EXTRACTION W/PHACO Right 03/08/2016   Procedure: CATARACT EXTRACTION PHACO AND INTRAOCULAR LENS PLACEMENT (IOC);  Surgeon: Sallee Lange, MD;  Location: [facility] ORS;  Service: Ophthalmology;  Laterality: Right;   FLUID CASSETTE [id number] H, EXP 07/19/17 [place]    3:16.8   AP%   27.2 CDE 94.14  . EXTERNAL EAR SURGERY Left   . LOBECTOMY Right 1952   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17   Family History  Problem Relation Age of Onset  . Stroke Mother   . Hypertension Father   . Stroke Father   . Hypertension  Sister   . Hypertension Brother   . Cancer Brother        brain  . Hypertension Sister   . Hypertension Brother   . Cancer Brother        stomach   Social History   Socioeconomic History  . Marital status: Widowed    Spouse name: [name]  . Number of children: 4  . Years of education: Not on file  . Highest education level: 9th grade  Occupational History  . Occupation: Retired  Tobacco Use  . Smoking status: Never Smoker  . Smokeless tobacco: Never Used  . Tobacco comment: smoking cessation materials not required  Substance and Sexual Activity  . Alcohol use: No    Alcohol/week: 0.0 standard drinks  . Drug use: No  . Sexual activity: Not Currently  Other Topics Concern  . Not on file  Social History Narrative  . Not on file   Social Determinants of Health   Financial Resource Strain: Low Risk   . Difficulty of Paying Living Expenses: Not hard at all  Food Insecurity: No Food Insecurity  . Worried About Programme researcher, broadcasting/film/video in the Last Year: Never true  . Ran Out of Food in the Last Year: Never true  Transportation Needs: No Transportation Needs  . Lack of Transportation (Medical): No  . Lack of Transportation (Non-Medical): No  Physical Activity: Inactive  . Days of Exercise per Week: 0 days  . Minutes of Exercise per Session: 0 min  Stress: Stress Concern Present  . Feeling of Stress : Rather much  Social Connections: Unknown  . Frequency of Communication with Friends and Family: Patient refused  . Frequency of Social Gatherings with Friends and Family: Patient refused  . Attends Religious Services: Patient refused  . Active Member of Clubs or Organizations: Patient refused  . Attends Banker Meetings: Patient refused  . Marital Status: Widowed    Outpatient Encounter Medications as of 10/23/2019  Medication Sig  . amLODipine (NORVASC) 5 MG tablet TAKE 1 TABLET BY MOUTH EVERY DAY  . Cholecalciferol (VITAMIN D3) 5000 UNITS CAPS Take 1 capsule by  mouth daily.   Marland Kitchen escitalopram (LEXAPRO) 5 MG tablet Take 1 tablet (5 mg total) by mouth daily.  . metoprolol succinate (TOPROL-XL) 50 MG 24 hr tablet Take 1 tablet (50 mg total) by mouth daily. Take with or immediately following a meal.  . rosuvastatin (CRESTOR) 10 MG tablet Take 1 tablet (10 mg total) by mouth daily.   No facility-administered encounter medications on file as of 10/23/2019.    Activities of Daily Living In your present state of health, do you have any difficulty performing the following activities: 10/23/2019 10/06/2019  Hearing? Malvin Johns  Vision? N N  Difficulty concentrating or making decisions? Malvin Johns  Walking or climbing stairs? Y N  Dressing or bathing?  N Y  Doing errands, shopping? Y Y  Comment - Does not Engineer, manufacturing and eating ? N -  Using the Toilet? N -  In the past six months, have you accidently leaked urine? Y -  Comment wears pads for protection if leaving home -  Do you have problems with loss of bowel control? N -  Managing your Medications? N -  Managing your Finances? N -  Housekeeping or managing your Housekeeping? N -  Some recent data might be hidden    Patient Care Team: Danelle Berry, PA-C as PCP - General (Family Medicine) Jomarie Longs, MD as Consulting Physician (Psychiatry) Oscar La, RN as Registered Nurse    Assessment:   This is a routine wellness examination for Shawni.  Exercise Activities and Dietary recommendations Current Exercise Habits: The patient does not participate in regular exercise at present, Exercise limited by: orthopedic condition(s)  Goals    . DIET - INCREASE WATER INTAKE     Recommend to drink at least 6-8 8oz glasses of water per day.    . My blood pressure seems to be my only problem (pt-stated)     Current Barriers:  . Lack of BP cuff in home  Nurse Case Manager Clinical Goal(s):  Marland Kitchen Over the next 60 days, patient will not experience hospital admission. Hospital Admissions in last 6 months = 1  for HTN emergency . Over the next 30 days, patient will verbalize basic understanding of hypertension disease process and self health management plan as evidenced by continuing to take medications as prescribed including HTN and anxiety medications, continuing to adhere to low sodium/DASH diet, resting when feels BP is elevated, and notifying PCP/daughters if s/s of hypertension do not go away with rest.  Interventions:  . Evaluation of current treatment plan related to hypertension self management and patient's adherence to plan as established by provider/CCM RN CM . Assessed for ongoing anxiety. . Assessed for medication adherence . Discussed plans with patient for ongoing care management follow up and provided patient with direct contact information for care management team . Reviewed scheduled/upcoming provider appointments including: no upcoming appointments  Patient Self Care Activities:  . Self administers medications as prescribed . Attends all scheduled provider appointments . Calls provider office for new concerns, questions, or BP outside discussed parameters . Follows a low sodium diet/DASH diet  Please see past updates related to this goal by clicking on the "Past Updates" button in the selected goal         Fall Risk Fall Risk  10/23/2019 10/06/2019 07/23/2019 04/01/2019 11/20/2018  Falls in the past year? 0 0 0 0 0  Comment - - - - -  Number falls in past yr: 0 0 0 0 -  Injury with Fall? 0 0 0 0 -  Risk Factor Category  - - - - -  Risk for fall due to : Impaired balance/gait - - - Impaired mobility  Risk for fall due to: Comment - - - - -  Follow up Falls prevention discussed - - - -   FALL RISK PREVENTION PERTAINING TO THE HOME:  Any stairs in or around the home? Yes  If so, are there any without handrails? Yes   Home free of loose throw rugs in walkways, pet beds, electrical cords, etc? Yes  Adequate lighting in your home to reduce risk of falls? Yes   ASSISTIVE  DEVICES UTILIZED TO PREVENT FALLS:  Life alert? Yes  Use of  a cane, walker or w/c? Yes  Grab bars in the bathroom? Yes  Shower chair or bench in shower? Yes  Elevated toilet seat or a handicapped toilet? Yes   DME ORDERS:  DME order needed?  No   TIMED UP AND GO:  Was the test performed? No . Telephonic visit.   Education: Fall risk prevention has been discussed.  Intervention(s) required? No    DME/home health order needed?  No   Depression Screen PHQ 2/9 Scores 10/23/2019 10/06/2019 07/23/2019 04/01/2019  PHQ - 2 Score 1 1 2 2   PHQ- 9 Score 3 3 3 2      Cognitive Function     6CIT Screen 10/23/2019 09/04/2017  What Year? 0 points 0 points  What month? 0 points 0 points  What time? 0 points 0 points  Count back from 20 0 points 0 points  Months in reverse 0 points 0 points  Repeat phrase 6 points 0 points  Total Score 6 0    Immunization History  Administered Date(s) Administered  . Influenza, High Dose Seasonal PF 03/16/2015, 03/13/2016, 02/21/2017  . Influenza-Unspecified 02/19/2018  . Pneumococcal Conjugate-13 09/04/2017  . Pneumococcal Polysaccharide-23 05/22/2005    Qualifies for Shingles Vaccine? Yes . Due for Shingrix. Education has been provided regarding the importance of this vaccine. Pt has been advised to call insurance company to determine out of pocket expense. Advised may also receive vaccine at local pharmacy or Health Dept. Verbalized acceptance and understanding.  Tdap: Although this vaccine is not a covered service during a Wellness Exam, does the patient still wish to receive this vaccine today?  No .  Education has been provided regarding the importance of this vaccine. Advised may receive this vaccine at local pharmacy or Health Dept. Aware to provide a copy of the vaccination record if obtained from local pharmacy or Health Dept. Verbalized acceptance and understanding.  Flu Vaccine: Due for Flu vaccine. Does the patient want to receive this vaccine  today?  No . Education has been provided regarding the importance of this vaccine but still declined. Advised may receive this vaccine at local pharmacy or Health Dept. Aware to provide a copy of the vaccination record if obtained from local pharmacy or Health Dept. Verbalized acceptance and understanding.  Pneumococcal Vaccine: Up to date  Covid-19 Vaccine: Due for Covid-19 vaccine. Education has been provided regarding the importance of this vaccine. Patient advised may receive at local pharmacy, Health Dept or [facility]. Aware to provide a copy of the vaccination record once obtained. Verbalized acceptance and understanding.    Screening Tests Health Maintenance  Topic Date Due  . COVID-19 Vaccine (1) Never done  . TETANUS/TDAP  07/22/2020 (Originally 10/02/1947)  . INFLUENZA VACCINE  12/21/2019  . PNA vac Low Risk Adult  Completed  . DEXA SCAN  Discontinued    Cancer Screenings:  Colorectal Screening: No longer required.   Mammogram: No longer required.   Bone Density: No longer required.   Lung Cancer Screening: (Low Dose CT Chest recommended if Age 33-80 years, 30 pack-year currently smoking OR have quit w/in 15years.) does not qualify.    Additional Screening:  Hepatitis C Screening: no longer required  Vision Screening: Recommended annual ophthalmology exams for early detection of glaucoma and other disorders of the eye. Is the patient up to date with their annual eye exam?  No  Who is the provider or what is the name of the office in which the pt attends annual eye exams? [facility]  Dental Screening: Recommended annual dental exams for proper oral hygiene  Community Resource Referral:  CRR required this visit?  No      Plan:     I have personally reviewed and addressed the Medicare Annual Wellness questionnaire and have noted the following in the patient's chart:  A. Medical and social history B. Use of alcohol, tobacco or illicit drugs  C. Current  medications and supplements D. Functional ability and status E.  Nutritional status F.  Physical activity G. Advance directives H. List of other physicians I.  Hospitalizations, surgeries, and ER visits in previous 12 months J.  Vitals K. Screenings such as hearing and vision if needed, cognitive and depression L. Referrals and appointments   In addition, I have reviewed and discussed with patient certain preventive protocols, quality metrics, and best practice recommendations. A written personalized care plan for preventive services as well as general preventive health recommendations were provided to patient.   Signed,  Reather LittlerKasey Khyler Urda, LPN Nurse Health Advisor   Nurse Notes: none

## 2019-10-23 NOTE — Patient Instructions (Signed)
Tamara Sparks , Thank you for taking time to come for your Medicare Wellness Visit. I appreciate your ongoing commitment to your health goals. Please review the following plan we discussed and let me know if I can assist you in the future.   Screening recommendations/referrals: Colonoscopy: no longer required Mammogram: no longer required Bone Density: no longer required Recommended yearly ophthalmology/optometry visit for glaucoma screening and checkup Recommended yearly dental visit for hygiene and checkup  Vaccinations: Influenza vaccine: postponed Pneumococcal vaccine: done 02/28/18 Tdap vaccine: due Shingles vaccine: Shingrix discussed. Please contact your pharmacy for coverage information.  Covid-19:discussed  Conditions/risks identified: Recommend continuing to prevent falls   Next appointment: Follow up in one year for your annual wellness visit    Preventive Care 65 Years and Older, Female Preventive care refers to lifestyle choices and visits with your health care provider that can promote health and wellness. What does preventive care include?  A yearly physical exam. This is also called an annual well check.  Dental exams once or twice a year.  Routine eye exams. Ask your health care provider how often you should have your eyes checked.  Personal lifestyle choices, including:  Daily care of your teeth and gums.  Regular physical activity.  Eating a healthy diet.  Avoiding tobacco and drug use.  Limiting alcohol use.  Practicing safe sex.  Taking low-dose aspirin every day.  Taking vitamin and mineral supplements as recommended by your health care provider. What happens during an annual well check? The services and screenings done by your health care provider during your annual well check will depend on your age, overall health, lifestyle risk factors, and family history of disease. Counseling  Your health care provider may ask you questions about  your:  Alcohol use.  Tobacco use.  Drug use.  Emotional well-being.  Home and relationship well-being.  Sexual activity.  Eating habits.  History of falls.  Memory and ability to understand (cognition).  Work and work Astronomer.  Reproductive health. Screening  You may have the following tests or measurements:  Height, weight, and BMI.  Blood pressure.  Lipid and cholesterol levels. These may be checked every 5 years, or more frequently if you are over 84 years old.  Skin check.  Lung cancer screening. You may have this screening every year starting at age 84 if you have a 30-pack-year history of smoking and currently smoke or have quit within the past 15 years.  Fecal occult blood test (FOBT) of the stool. You may have this test every year starting at age 3.  Flexible sigmoidoscopy or colonoscopy. You may have a sigmoidoscopy every 5 years or a colonoscopy every 10 years starting at age 84.  Hepatitis C blood test.  Hepatitis B blood test.  Sexually transmitted disease (STD) testing.  Diabetes screening. This is done by checking your blood sugar (glucose) after you have not eaten for a while (fasting). You may have this done every 1-3 years.  Bone density scan. This is done to screen for osteoporosis. You may have this done starting at age 40.  Mammogram. This may be done every 1-2 years. Talk to your health care provider about how often you should have regular mammograms. Talk with your health care provider about your test results, treatment options, and if necessary, the need for more tests. Vaccines  Your health care provider may recommend certain vaccines, such as:  Influenza vaccine. This is recommended every year.  Tetanus, diphtheria, and acellular pertussis (Tdap, Td) vaccine.  You may need a Td booster every 10 years.  Zoster vaccine. You may need this after age 84.  Pneumococcal 13-valent conjugate (PCV13) vaccine. One dose is recommended  after age 6.  Pneumococcal polysaccharide (PPSV23) vaccine. One dose is recommended after age 84. Talk to your health care provider about which screenings and vaccines you need and how often you need them. This information is not intended to replace advice given to you by your health care provider. Make sure you discuss any questions you have with your health care provider. Document Released: 06/04/2015 Document Revised: 01/26/2016 Document Reviewed: 03/09/2015 Elsevier Interactive Patient Education  2017 [place] Prevention in the Home Falls can cause injuries. They can happen to people of all ages. There are many things you can do to make your home safe and to help prevent falls. What can I do on the outside of my home?  Regularly fix the edges of walkways and driveways and fix any cracks.  Remove anything that might make you trip as you walk through a door, such as a raised step or threshold.  Trim any bushes or trees on the path to your home.  Use bright outdoor lighting.  Clear any walking paths of anything that might make someone trip, such as rocks or tools.  Regularly check to see if handrails are loose or broken. Make sure that both sides of any steps have handrails.  Any raised decks and porches should have guardrails on the edges.  Have any leaves, snow, or ice cleared regularly.  Use sand or salt on walking paths during winter.  Clean up any spills in your garage right away. This includes oil or grease spills. What can I do in the bathroom?  Use night lights.  Install grab bars by the toilet and in the tub and shower. Do not use towel bars as grab bars.  Use non-skid mats or decals in the tub or shower.  If you need to sit down in the shower, use a plastic, non-slip stool.  Keep the floor dry. Clean up any water that spills on the floor as soon as it happens.  Remove soap buildup in the tub or shower regularly.  Attach bath mats securely with  double-sided non-slip rug tape.  Do not have throw rugs and other things on the floor that can make you trip. What can I do in the bedroom?  Use night lights.  Make sure that you have a light by your bed that is easy to reach.  Do not use any sheets or blankets that are too big for your bed. They should not hang down onto the floor.  Have a firm chair that has side arms. You can use this for support while you get dressed.  Do not have throw rugs and other things on the floor that can make you trip. What can I do in the kitchen?  Clean up any spills right away.  Avoid walking on wet floors.  Keep items that you use a lot in easy-to-reach places.  If you need to reach something above you, use a strong step stool that has a grab bar.  Keep electrical cords out of the way.  Do not use floor polish or wax that makes floors slippery. If you must use wax, use non-skid floor wax.  Do not have throw rugs and other things on the floor that can make you trip. What can I do with my stairs?  Do not leave any  items on the stairs.  Make sure that there are handrails on both sides of the stairs and use them. Fix handrails that are broken or loose. Make sure that handrails are as long as the stairways.  Check any carpeting to make sure that it is firmly attached to the stairs. Fix any carpet that is loose or worn.  Avoid having throw rugs at the top or bottom of the stairs. If you do have throw rugs, attach them to the floor with carpet tape.  Make sure that you have a light switch at the top of the stairs and the bottom of the stairs. If you do not have them, ask someone to add them for you. What else can I do to help prevent falls?  Wear shoes that:  Do not have high heels.  Have rubber bottoms.  Are comfortable and fit you well.  Are closed at the toe. Do not wear sandals.  If you use a stepladder:  Make sure that it is fully opened. Do not climb a closed stepladder.  Make  sure that both sides of the stepladder are locked into place.  Ask someone to hold it for you, if possible.  Clearly mark and make sure that you can see:  Any grab bars or handrails.  First and last steps.  Where the edge of each step is.  Use tools that help you move around (mobility aids) if they are needed. These include:  Canes.  Walkers.  Scooters.  Crutches.  Turn on the lights when you go into a dark area. Replace any light bulbs as soon as they burn out.  Set up your furniture so you have a clear path. Avoid moving your furniture around.  If any of your floors are uneven, fix them.  If there are any pets around you, be aware of where they are.  Review your medicines with your doctor. Some medicines can make you feel dizzy. This can increase your chance of falling. Ask your doctor what other things that you can do to help prevent falls. This information is not intended to replace advice given to you by your health care provider. Make sure you discuss any questions you have with your health care provider. Document Released: 03/04/2009 Document Revised: 10/14/2015 Document Reviewed: 06/12/2014 Elsevier Interactive Patient Education  2017 [employer].

## 2020-01-01 ENCOUNTER — Telehealth: Payer: Self-pay

## 2020-01-01 NOTE — Telephone Encounter (Signed)
Patient called my direct line stating she needs a refill on her hydralazine. Advised this med was discontinuned 10/06/19 and was only written to take when BP > 160/100. I asked if her blood pressure was elevated or if she had taken it and she stated "I can tell that it's up" pt was advised to schedule appt with provider but may need follow up from nurse regarding refill. Thank you.

## 2020-01-01 NOTE — Telephone Encounter (Signed)
Pt notified. appt scheduled.

## 2020-02-10 NOTE — Progress Notes (Signed)
Name: Tamara Sparks   MRN: [id number]    DOB: Oct 06, 1928   Date:02/11/2020       Progress Note  Chief Complaint  Patient presents with  . Hypertension     Subjective:   Tamara Sparks is a 86 y.o. female, presents to clinic for   Hypertension:  Currently managed on Amlodopine 5mg  qd and Metoprolol 50mg  qd - she still gets a little anxious about BP and has white coat HTN, previously had PRN BP meds but her daughter has had to take away her home BP cuff cause it was causing increased anxiety and worry about health and higher BP readings.  Currently BP fairly well controlled today here in office even though it tends to be a little higher in clinic.  Pt reports good med compliance and denies any SE.    BP Readings from Last 3 Encounters:  02/11/20 (!) 144/72  07/23/19 (!) 152/82  07/25/18 140/78   Pt denies CP, SOB, exertional sx, LE edema, palpitation, Ha's, visual disturbances, lightheadedness, hypotension, syncope.  Hyperlipidemia: Currently treated with Crestor 10mg  qd - we have had discussions about holding this med due to age, she did restart it 6 months ago and wants to continue med due to hx of CVA for her and hx of stroke and HTN in both her parents.  No SE or concerns restarting med. Last Lipids: Lab Results  Component Value Date   CHOL 257 (H) 07/23/2019   HDL 50 07/23/2019   LDLCALC 165 (H) 07/23/2019   TRIG 254 (H) 07/23/2019   CHOLHDL 5.1 (H) 07/23/2019   - Denies: Chest pain, shortness of breath, myalgias, claudication  Depression screen [facility] 2/9 02/11/2020 10/23/2019 10/06/2019  Decreased Interest 0 0 0  Down, Depressed, Hopeless 0 1 1  PHQ - 2 Score 0 1 1  Altered sleeping - 0 0  Tired, decreased energy - 0 0  Change in appetite - 0 0  Feeling bad or failure about yourself  - 1 1  Trouble concentrating - 1 1  Moving slowly or fidgety/restless - 0 0  Suicidal thoughts - 0 0  PHQ-9 Score - 3 3  Difficult doing work/chores - Not difficult at all Not  difficult at all  Some recent data might be hidden   Previously on lexapro - her daugher doesn't think she's taking it - phq neg, reviewed today, mood good.  Still gets easily anxious about health and has white coat HTN   Current Outpatient Medications:  .  amLODipine (NORVASC) 5 MG tablet, TAKE 1 TABLET BY MOUTH EVERY DAY, Disp: 90 tablet, Rfl: 3 .  Cholecalciferol (VITAMIN D3) 5000 UNITS CAPS, Take 1 capsule by mouth daily. , Disp: , Rfl:  .  metoprolol succinate (TOPROL-XL) 50 MG 24 hr tablet, Take 1 tablet (50 mg total) by mouth daily. Take with or immediately following a meal., Disp: 90 tablet, Rfl: 3 .  rosuvastatin (CRESTOR) 10 MG tablet, Take 1 tablet (10 mg total) by mouth daily., Disp: 90 tablet, Rfl: 3  Patient Active Problem List   Diagnosis Date Noted  . MDD (major depressive disorder), single episode, mild (HCC) 10/06/2019  . Hypertensive urgency 07/05/2018  . Abnormal ankle brachial index (ABI) 03/11/2018  . Aortic atherosclerosis (HCC) 01/20/2018  . Multiple renal cysts 01/20/2018  . History of TB (tuberculosis) 10/05/2017  . History of lobectomy of lung 10/05/2017  . Dyslipidemia 09/23/2015  . Hyperglycemia 09/23/2015  . Seasonal allergies 09/09/2015  . Dyslipidemia, goal LDL below  130 03/16/2015  . Anxiety disorder 12/08/2014  . Rapid heart beat 01/23/2014  . Diastolic murmur 01/23/2014  . Essential hypertension 01/23/2014    Past Surgical History:  Procedure Laterality Date  . CARDIAC CATHETERIZATION     MC  . CATARACT EXTRACTION W/PHACO Right 03/08/2016   Procedure: CATARACT EXTRACTION PHACO AND INTRAOCULAR LENS PLACEMENT (IOC);  Surgeon: Sallee Lange, MD;  Location: [facility] ORS;  Service: Ophthalmology;  Laterality: Right;   FLUID CASSETTE [id number] H, EXP 07/19/17 [place]    3:16.8   AP%   27.2 CDE 94.14  . EXTERNAL EAR SURGERY Left   . LOBECTOMY Right 1952   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17    Family History  Problem Relation Age of Onset  . Stroke  Mother   . Hypertension Father   . Stroke Father   . Hypertension Sister   . Hypertension Brother   . Cancer Brother        brain  . Hypertension Sister   . Hypertension Brother   . Cancer Brother        stomach    Social History   Tobacco Use  . Smoking status: Never Smoker  . Smokeless tobacco: Never Used  . Tobacco comment: smoking cessation materials not required  Vaping Use  . Vaping Use: Never used  Substance Use Topics  . Alcohol use: No    Alcohol/week: 0.0 standard drinks  . Drug use: No     Allergies  Allergen Reactions  . Sertraline   . Penicillins Rash    Has patient had a PCN reaction causing immediate rash, facial/tongue/throat swelling, SOB or lightheadedness with hypotension: Yes Has patient had a PCN reaction causing severe rash involving mucus membranes or skin necrosis: No Has patient had a PCN reaction that required hospitalization: No Has patient had a PCN reaction occurring within the last 10 years: No If all of the above answers are "NO", then may proceed with Cephalosporin use.  . Sulfa Antibiotics Other (See Comments)    Pt does not remember but thinks that it just didn't agree with her    Health Maintenance  Topic Date Due  . COVID-19 Vaccine (1) 02/27/2020 (Originally 10/03/1940)  . TETANUS/TDAP  07/22/2020 (Originally 10/02/1947)  . INFLUENZA VACCINE  Completed  . PNA vac Low Risk Adult  Completed  . DEXA SCAN  Discontinued    Chart Review Today: I personally reviewed active problem list, medication list, allergies, family history, social history, health maintenance, notes from last encounter, lab results, imaging with the patient/caregiver today.   Review of Systems  10 Systems reviewed and are negative for acute change except as noted in the HPI.   Objective:   Vitals:   02/11/20 1038  BP: (!) 144/72  Pulse: 74  Resp: 16  Temp: 97.8 F (36.6 C)  TempSrc: Oral  SpO2: 98%  Weight: 114 lb 4.8 oz (51.8 kg)  Height: 5\' 1"   (1.549 m)    Body mass index is 21.6 kg/m.  Physical Exam Vitals and nursing note reviewed.  Constitutional:      General: She is not in acute distress.    Appearance: Normal appearance. She is well-developed. She is not ill-appearing, toxic-appearing or diaphoretic.     Interventions: Face mask in place.     Comments: Elderly, well appearing, pleasant, HOH  HENT:     Head: Normocephalic and atraumatic.     Right Ear: External ear normal.     Left Ear: External ear normal.  Eyes:  General: Lids are normal. No scleral icterus.       Right eye: No discharge.        Left eye: No discharge.     Conjunctiva/sclera: Conjunctivae normal.  Neck:     Trachea: Phonation normal. No tracheal deviation.  Cardiovascular:     Rate and Rhythm: Normal rate and regular rhythm.     Pulses: Normal pulses.          Radial pulses are 2+ on the right side and 2+ on the left side.     Heart sounds: Normal heart sounds. No murmur heard.  No friction rub. No gallop.   Pulmonary:     Effort: Pulmonary effort is normal. No respiratory distress.     Breath sounds: Normal breath sounds. No stridor. No wheezing, rhonchi or rales.  Chest:     Chest wall: No tenderness.  Abdominal:     General: Bowel sounds are normal. There is no distension.     Palpations: Abdomen is soft.  Musculoskeletal:     Right lower leg: No edema.     Left lower leg: No edema.  Skin:    General: Skin is warm and dry.     Coloration: Skin is not jaundiced or pale.     Findings: No rash.  Neurological:     Mental Status: She is alert.     Motor: No abnormal muscle tone.     Gait: Gait normal.  Psychiatric:        Mood and Affect: Mood normal.        Speech: Speech normal.        Behavior: Behavior normal.         Assessment & Plan:     ICD-10-CM   1. Essential hypertension  I10 Lipid panel    COMPLETE METABOLIC PANEL WITH GFR   BP well controlled per age, no med SE, continue norvasc and metoprolol and f/up in  6 months  2. Dyslipidemia  E78.5 Lipid panel    COMPLETE METABOLIC PANEL WITH GFR   Pt has restarted crestor, doing well no SE or concerns, will recheck lipids due to very high last labs and restarting meds  3. MDD (major depressive disorder), single episode, mild (HCC)  F32.0    mood good - pt has not been taking lexapro any more  4. Need for influenza vaccination  Z23 Flu Vaccine QUAD High Dose(Fluad)  5. Aortic atherosclerosis (HCC)  I70.0 Lipid panel    COMPLETE METABOLIC PANEL WITH GFR   on statin, monitoring  6. Advanced care planning/counseling discussion  Z71.89    Pt states she does not want to be intubated, she has POA done previously with her younger daughter - reviewed packet - will get updated forms/records     Return in about 6 months (around 08/10/2020) for Routine follow-up.   Danelle Berry, PA-C 02/11/20 11:21 AM

## 2020-02-11 ENCOUNTER — Ambulatory Visit (INDEPENDENT_AMBULATORY_CARE_PROVIDER_SITE_OTHER): Payer: Medicare Other | Admitting: Family Medicine

## 2020-02-11 ENCOUNTER — Encounter: Payer: Self-pay | Admitting: Family Medicine

## 2020-02-11 ENCOUNTER — Other Ambulatory Visit: Payer: Self-pay

## 2020-02-11 VITALS — BP 144/72 | HR 74 | Temp 97.8°F | Resp 16 | Ht 61.0 in | Wt 114.3 lb

## 2020-02-11 DIAGNOSIS — I700 Atherosclerosis of aorta: Secondary | ICD-10-CM | POA: Diagnosis not present

## 2020-02-11 DIAGNOSIS — I10 Essential (primary) hypertension: Secondary | ICD-10-CM

## 2020-02-11 DIAGNOSIS — F320 Major depressive disorder, single episode, mild: Secondary | ICD-10-CM | POA: Diagnosis not present

## 2020-02-11 DIAGNOSIS — E785 Hyperlipidemia, unspecified: Secondary | ICD-10-CM | POA: Diagnosis not present

## 2020-02-11 DIAGNOSIS — Z23 Encounter for immunization: Secondary | ICD-10-CM

## 2020-02-11 DIAGNOSIS — Z7189 Other specified counseling: Secondary | ICD-10-CM

## 2020-02-11 LAB — COMPLETE METABOLIC PANEL WITH GFR
AG Ratio: 1.5 (calc) (ref 1.0–2.5)
ALT: 12 U/L (ref 6–29)
AST: 19 U/L (ref 10–35)
Albumin: 4.5 g/dL (ref 3.6–5.1)
Alkaline phosphatase (APISO): 89 U/L (ref 37–153)
BUN: 11 mg/dL (ref 7–25)
CO2: 29 mmol/L (ref 20–32)
Calcium: 10 mg/dL (ref 8.6–10.4)
Chloride: 101 mmol/L (ref 98–110)
Creat: 0.83 mg/dL (ref 0.60–0.88)
GFR, Est African American: 72 mL/min/{1.73_m2} (ref >=60)
GFR, Est Non African American: 63 mL/min/{1.73_m2} (ref >=60)
Globulin: 3 g/dL (calc) (ref 1.9–3.7)
Glucose, Bld: 87 mg/dL (ref 65–99)
Potassium: 4 mmol/L (ref 3.5–5.3)
Sodium: 139 mmol/L (ref 135–146)
Total Bilirubin: 0.6 mg/dL (ref 0.2–1.2)
Total Protein: 7.5 g/dL (ref 6.1–8.1)

## 2020-02-11 LAB — LIPID PANEL
Cholesterol: 146 mg/dL (ref <200)
HDL: 50 mg/dL (ref >=50)
LDL Cholesterol (Calc): 73 mg/dL (calc)
Non-HDL Cholesterol (Calc): 96 mg/dL (calc) (ref <130)
Total CHOL/HDL Ratio: 2.9 (calc) (ref <5.0)
Triglycerides: 152 mg/dL — ABNORMAL HIGH (ref <150)

## 2020-02-11 NOTE — Patient Instructions (Signed)
Keep working on enjoying life and staying healthy and well.    We will see you in 6 months - please come sooner if you have any health concerns

## 2020-07-09 ENCOUNTER — Other Ambulatory Visit: Payer: Self-pay

## 2020-07-09 ENCOUNTER — Emergency Department
Admission: EM | Admit: 2020-07-09 | Discharge: 2020-07-09 | Disposition: A | Discharge status: Home / Self Care | Payer: Medicare Other | Attending: Physician Assistant | Admitting: Physician Assistant

## 2020-07-09 ENCOUNTER — Emergency Department: Payer: Medicare Other

## 2020-07-09 ENCOUNTER — Encounter: Payer: Self-pay | Admitting: Emergency Medicine

## 2020-07-09 DIAGNOSIS — R6889 Other general symptoms and signs: Secondary | ICD-10-CM | POA: Diagnosis not present

## 2020-07-09 DIAGNOSIS — F419 Anxiety disorder, unspecified: Secondary | ICD-10-CM | POA: Insufficient documentation

## 2020-07-09 DIAGNOSIS — I38 Endocarditis, valve unspecified: Secondary | ICD-10-CM | POA: Diagnosis not present

## 2020-07-09 DIAGNOSIS — Z79899 Other long term (current) drug therapy: Secondary | ICD-10-CM | POA: Insufficient documentation

## 2020-07-09 DIAGNOSIS — I119 Hypertensive heart disease without heart failure: Secondary | ICD-10-CM | POA: Insufficient documentation

## 2020-07-09 DIAGNOSIS — Z743 Need for continuous supervision: Secondary | ICD-10-CM | POA: Diagnosis not present

## 2020-07-09 DIAGNOSIS — R0981 Nasal congestion: Secondary | ICD-10-CM | POA: Diagnosis not present

## 2020-07-09 DIAGNOSIS — R404 Transient alteration of awareness: Secondary | ICD-10-CM | POA: Diagnosis not present

## 2020-07-09 DIAGNOSIS — R519 Headache, unspecified: Secondary | ICD-10-CM | POA: Insufficient documentation

## 2020-07-09 LAB — CBC WITH DIFFERENTIAL/PLATELET
Abs Immature Granulocytes: 0.02 K/uL (ref 0.00–0.07)
Basophils Absolute: 0.1 K/uL (ref 0.0–0.1)
Basophils Relative: 1 %
Eosinophils Absolute: 0.2 K/uL (ref 0.0–0.5)
Eosinophils Relative: 2 %
HCT: 42.4 % (ref 36.0–46.0)
Hemoglobin: 14.5 g/dL (ref 12.0–15.0)
Immature Granulocytes: 0 %
Lymphocytes Relative: 16 %
Lymphs Abs: 1.3 K/uL (ref 0.7–4.0)
MCH: 30.2 pg (ref 26.0–34.0)
MCHC: 34.2 g/dL (ref 30.0–36.0)
MCV: 88.3 fL (ref 80.0–100.0)
Monocytes Absolute: 0.5 K/uL (ref 0.1–1.0)
Monocytes Relative: 6 %
Neutro Abs: 6.1 K/uL (ref 1.7–7.7)
Neutrophils Relative %: 75 %
Platelets: 328 K/uL (ref 150–400)
RBC: 4.8 MIL/uL (ref 3.87–5.11)
RDW: 12.9 % (ref 11.5–15.5)
WBC: 8.1 K/uL (ref 4.0–10.5)
nRBC: 0 % (ref 0.0–0.2)

## 2020-07-09 LAB — URINALYSIS, COMPLETE (UACMP) WITH MICROSCOPIC
Bilirubin Urine: NEGATIVE
Glucose, UA: NEGATIVE mg/dL
Hgb urine dipstick: NEGATIVE
Ketones, ur: NEGATIVE mg/dL
Nitrite: NEGATIVE
Protein, ur: NEGATIVE mg/dL
Specific Gravity, Urine: 1.006 (ref 1.005–1.030)
pH: 8 (ref 5.0–8.0)

## 2020-07-09 LAB — BASIC METABOLIC PANEL
Anion gap: 9 (ref 5–15)
BUN: 9 mg/dL (ref 8–23)
CO2: 28 mmol/L (ref 22–32)
Calcium: 9.7 mg/dL (ref 8.9–10.3)
Chloride: 102 mmol/L (ref 98–111)
Creatinine, Ser: 0.72 mg/dL (ref 0.44–1.00)
GFR, Estimated: >60 mL/min (ref >60)
Glucose, Bld: 125 mg/dL — ABNORMAL HIGH (ref 70–99)
Potassium: 4 mmol/L (ref 3.5–5.1)
Sodium: 139 mmol/L (ref 135–145)

## 2020-07-09 NOTE — ED Notes (Signed)
EKG to EDP Robinson.  

## 2020-07-09 NOTE — Discharge Instructions (Signed)
Ms. Robertshaw, your exam, labs, EKG, and CT scan were all normal and reassuring today.  You should continue with your home medications as prescribed.  Follow-up with your primary provider should you decide to take a regular daily medication for anxiety.  Return to the ED if needed.

## 2020-07-09 NOTE — ED Provider Notes (Signed)
[facility] Emergency Department Provider Note  ____________________________________________   None    (approximate)  I have reviewed the triage vital signs and the nursing notes.   HISTORY  Chief Complaint Anxiety and Nasal Congestion  Intermittent history provided by patient's adult daughter. She reports that the patient has mostly good days, but today she had a "bad day."  The daughter denies any acute fall, fever, chills, chest pain, shortness of breath.  She notes that the patient seemed somewhat agitated and anxious.  No reported SI/HI, or hallucinations.  Daughter also reports that the patient had been prescribed Lexapro which she had been taking as needed, instead of daily as prescribed.  HPI Tamara Sparks is a 87 y.o. female presents from home via EMS, for complaints of anxiety and head pressure.  The patient lives alone, but her adult daughters are within 5 minutes drive of her home.  Patient denies any outright fall, trauma, or injury.  She also denies any chest pain, shortness of breath, or weakness.  She states she just feels very anxious and nervous.  She also recalls the recent death of her older sister in May 12, 2023.  She also laments about her parents both having strokes as well as heart disease prior to the death.  Patient does not currently take any antianxiety medicine.  When asked about her previously prescribed Lexapro, she describes that she discontinued the medication after she read the package insert warned that the medicine could worsen symptoms of anxiety.  Patient without any other complaints at the time she continues to report good access to food and water, and has had no difficulties controlling her bladder or bowel.     Past Medical History:  Diagnosis Date  . Anxiety state, unspecified   . Cardiac dysrhythmia, unspecified   . Depression   . Dyslipidemia, goal LDL below 130   . Dyspnea    DOE, patient does alot of work for living  alone  . Dysrhythmia    patient unsure of this  . Heart murmur   . History of stroke    Right cerebellar intracerebral hemorrhage  . HOH (hard of hearing)   . Hyposmolality and/or hyponatremia   . Stroke [facility]) 2012   right hand shakes and can't write well  . Tremors of nervous system    RIGHT ARM  . Tuberculosis 1947   RIGHT UPPER LOBE EXCISION AGE 20  . Unspecified essential hypertension   . Unspecified vitamin D deficiency   . Urinary tract infection, site not specified     Patient Active Problem List   Diagnosis Date Noted  . MDD (major depressive disorder), single episode, mild (HCC) 10/06/2019  . Hypertensive urgency 07/05/2018  . Abnormal ankle brachial index (ABI) 03/11/2018  . Aortic atherosclerosis (HCC) 01/20/2018  . Multiple renal cysts 01/20/2018  . History of TB (tuberculosis) 10/05/2017  . History of lobectomy of lung 10/05/2017  . Dyslipidemia 09/23/2015  . Hyperglycemia 09/23/2015  . Seasonal allergies 09/09/2015  . Dyslipidemia, goal LDL below 130 03/16/2015  . Anxiety disorder 12/08/2014  . Rapid heart beat 01/23/2014  . Diastolic murmur 01/23/2014  . Essential hypertension 01/23/2014    Past Surgical History:  Procedure Laterality Date  . CARDIAC CATHETERIZATION     MC  . CATARACT EXTRACTION W/PHACO Right 03/08/2016   Procedure: CATARACT EXTRACTION PHACO AND INTRAOCULAR LENS PLACEMENT (IOC);  Surgeon: Sallee Lange, MD;  Location: [facility] ORS;  Service: Ophthalmology;  Laterality: Right;   FLUID CASSETTE [id number] H, EXP  07/19/17 [place]    3:16.8   AP%   27.2 CDE 94.14  . EXTERNAL EAR SURGERY Left   . LOBECTOMY Right 1952   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17    Prior to Admission medications   Medication Sig Start Date End Date Taking? Authorizing Provider  amLODipine (NORVASC) 5 MG tablet TAKE 1 TABLET BY MOUTH EVERY DAY 07/23/19   Danelle Berry, PA-C  Cholecalciferol (VITAMIN D3) 5000 UNITS CAPS Take 1 capsule by mouth daily.     [provider]  metoprolol succinate (TOPROL-XL) 50 MG 24 hr tablet Take 1 tablet (50 mg total) by mouth daily. Take with or immediately following a meal. 07/23/19   Danelle Berry, PA-C  rosuvastatin (CRESTOR) 10 MG tablet Take 1 tablet (10 mg total) by mouth daily. 07/24/19   Danelle Berry, PA-C    Allergies Sertraline, Penicillins, and Sulfa antibiotics  Family History  Problem Relation Age of Onset  . Stroke Mother   . Hypertension Father   . Stroke Father   . Hypertension Sister   . Hypertension Brother   . Cancer Brother        brain  . Hypertension Sister   . Hypertension Brother   . Cancer Brother        stomach    Social History Social History   Tobacco Use  . Smoking status: Never Smoker  . Smokeless tobacco: Never Used  . Tobacco comment: smoking cessation materials not required  Vaping Use  . Vaping Use: Never used  Substance Use Topics  . Alcohol use: No    Alcohol/week: 0.0 standard drinks  . Drug use: No    Review of Systems Constitutional: No fever/chills Eyes: No visual changes. ENT: No sore throat. Cardiovascular: Denies chest pain. Respiratory: Denies shortness of breath. Gastrointestinal: No abdominal pain.  No nausea, no vomiting.  No diarrhea.  No constipation. Genitourinary: Negative for dysuria. Musculoskeletal: Negative for back pain. Skin: Negative for rash. Neurological: Negative for headaches, focal weakness or numbness. Psychiatric: reports anxiety as above  ____________________________________________   PHYSICAL EXAM:  VITAL SIGNS: ED Triage Vitals [07/09/20 1421]  Enc Vitals Group     BP (!) 156/92     Pulse Rate 86     Resp 16     Temp 98.3 F (36.8 C)     Temp Source Oral     SpO2 97 %     Weight 115 lb (52.2 kg)     Height 5\' 1"  (1.549 m)     Head Circumference      Peak Flow      Pain Score 0     Pain Loc      Pain Edu?      Excl. in GC?    Constitutional: Alert and oriented. Well appearing and in no acute distress. Patient is  hard-of-hearing. Eyes: Conjunctivae are normal. PERRL. EOMI. Head: Atraumatic. Nose: No congestion/rhinnorhea. Ears: TMs intact bilaterally Mouth/Throat: Mucous membranes are moist.  Oropharynx non-erythematous. Neck: No stridor.   Cardiovascular: Normal rate, regular rhythm. Grossly normal heart sounds.  Good peripheral circulation. Respiratory: Normal respiratory effort.  No retractions. Lungs CTAB. Gastrointestinal: Soft and nontender. No distention. No abdominal bruits. No CVA tenderness. Musculoskeletal: No lower extremity tenderness nor edema.  No joint effusions. Neurologic:  Normal speech and language. No gross focal neurologic deficits are appreciated. No gait instability. Skin:  Skin is warm, dry and intact. No rash noted. Psychiatric: Mood and affect are normal. Speech and behavior are  normal. Patient  ____________________________________________   LABS (all labs ordered are listed, but only abnormal results are displayed)  Labs Reviewed  BASIC METABOLIC PANEL - Abnormal; Notable for the following components:      Result Value   Glucose, Bld 125 (*)    All other components within normal limits  URINALYSIS, COMPLETE (UACMP) WITH MICROSCOPIC - Abnormal; Notable for the following components:   Color, Urine STRAW (*)    APPearance CLEAR (*)    Leukocytes,Ua TRACE (*)    Bacteria, UA MANY (*)    All other components within normal limits  CBC WITH DIFFERENTIAL/PLATELET   ____________________________________________  EKG NSR Vent. rate 89 BPM PR interval 188 ms QRS duration 80 ms QT/QTc 404/491 ms P-R-T axes 65 -48 77 Anterior right fascicular block No STEMI ____________________________________________  RADIOLOGY I, Lissa HoardJenise V Bacon-Giavanni Odonovan, personally viewed and evaluated these images (plain radiographs) as part of my medical decision making, as well as reviewing the written report by the radiologist.  ED MD interpretation:  Agree with interpretation   Official  radiology report(s): CT Head Wo Contrast  Result Date: 07/09/2020 CLINICAL DATA:  Altered level of consciousness, anxiety, nasal congestion EXAM: CT HEAD WITHOUT CONTRAST TECHNIQUE: Contiguous axial images were obtained from the base of the skull through the vertex without intravenous contrast. COMPARISON:  07/06/2018 FINDINGS: Brain: No acute infarct or hemorrhage. Patchy hypodensities throughout the periventricular white matter unchanged consistent with chronic small vessel ischemic changes. Lateral ventricles and midline structures are unremarkable. No acute extra-axial fluid collections. No mass effect. Vascular: No hyperdense vessel or unexpected calcification. Skull: Normal. Negative for fracture or focal lesion. Sinuses/Orbits: No acute finding. Other: None. IMPRESSION: 1. Stable head CT, no acute process. Electronically Signed   By: Sharlet SalinaMichael  Brown M.D.   On: 07/09/2020 16:35  ____________________________________________   PROCEDURES  Procedure(s) performed (including Critical Care):  Procedures  ____________________________________________   INITIAL IMPRESSION / ASSESSMENT AND PLAN / ED COURSE  As part of my medical decision making, I reviewed the following data within the electronic MEDICAL RECORD NUMBER History obtained from family, EKG interpreted no significant changes from prior EKG, Old chart reviewed and Notes from prior ED visits.   Geriatric patient ED evaluation of an episode of anxiety just prior to arrival.  EMS was called by the patient's daughter after she had what the daughter describes as "a bad day."  The patient without any significant complaints of chest pain, shortness of breath, weakness, presents with complaints of feeling nervous and congested in her head.  Based labs and exam are also reassuring at this time.  CT scan not reveal any acute intracranial process.  Patient is calm and engaged at the time of this disposition.  Daughter is at bedside, reports that the patient  is again at baseline.  She reports patient has previously been prescribed antianxiety medicine, but will not take it daily as it is intended.  As such the patient's daughter does not believe that she needs a prescription for antianxiety medicine at this time.  I have advised that she should follow with primary provider if she intends to consider restarting the previously prescribed Lexapro.  Patient and daughter verbalized understanding at the time of this disposition.  A follow-up with the primary provider should be scheduled as necessary. ____________________________________________   FINAL CLINICAL IMPRESSION(S) / ED DIAGNOSES  Final diagnoses:  Anxiety     ED Discharge Orders    None      *Please note:  Festus HoltsLouise B Macaraeg was  evaluated in Emergency Department on 07/09/2020 for the symptoms described in the history of present illness. She was evaluated in the context of the global COVID-19 pandemic, which necessitated consideration that the patient might be at risk for infection with the SARS-CoV-2 virus that causes COVID-19. Institutional protocols and algorithms that pertain to the evaluation of patients at risk for COVID-19 are in a state of rapid change based on information released by regulatory bodies including the CDC and federal and state organizations. These policies and algorithms were followed during the patient's care in the ED.  Some ED evaluations and interventions may be delayed as a result of limited staffing during and the pandemic.*   Note:  This document was prepared using Dragon voice recognition software and may include unintentional dictation errors.    Lissa Hoard, PA-C 07/09/20 2304    Merwyn Katos, MD 07/09/20 2328

## 2020-07-09 NOTE — ED Triage Notes (Signed)
Pt to ED via [facility] from home for anxiety and nasal congestion. Pt states that she feels very nervous. Pt states that she has hx/o same in the past . Pt is in NAD.

## 2020-07-26 ENCOUNTER — Other Ambulatory Visit: Payer: Self-pay | Admitting: Family Medicine

## 2020-07-26 DIAGNOSIS — E785 Hyperlipidemia, unspecified: Secondary | ICD-10-CM

## 2020-08-07 ENCOUNTER — Other Ambulatory Visit: Payer: Self-pay | Admitting: Family Medicine

## 2020-08-07 DIAGNOSIS — I10 Essential (primary) hypertension: Secondary | ICD-10-CM

## 2020-08-10 ENCOUNTER — Encounter: Payer: Self-pay | Admitting: Family Medicine

## 2020-08-10 ENCOUNTER — Telehealth (INDEPENDENT_AMBULATORY_CARE_PROVIDER_SITE_OTHER): Payer: Medicare Other | Admitting: Family Medicine

## 2020-08-10 VITALS — BP 170/80 | HR 68 | Ht 61.0 in | Wt 110.0 lb

## 2020-08-10 DIAGNOSIS — I700 Atherosclerosis of aorta: Secondary | ICD-10-CM | POA: Diagnosis not present

## 2020-08-10 DIAGNOSIS — I10 Essential (primary) hypertension: Secondary | ICD-10-CM | POA: Diagnosis not present

## 2020-08-10 DIAGNOSIS — Z5181 Encounter for therapeutic drug level monitoring: Secondary | ICD-10-CM

## 2020-08-10 DIAGNOSIS — F320 Major depressive disorder, single episode, mild: Secondary | ICD-10-CM

## 2020-08-10 DIAGNOSIS — E785 Hyperlipidemia, unspecified: Secondary | ICD-10-CM

## 2020-08-10 DIAGNOSIS — F419 Anxiety disorder, unspecified: Secondary | ICD-10-CM

## 2020-08-10 MED ORDER — METOPROLOL SUCCINATE ER 50 MG PO TB24: 50.0000 mg | ORAL_TABLET | Freq: Every day | ORAL | 3 refills | Status: DC

## 2020-08-10 MED ORDER — AMLODIPINE BESYLATE 5 MG PO TABS: ORAL_TABLET | ORAL | 3 refills | Status: DC

## 2020-08-10 MED ORDER — ROSUVASTATIN CALCIUM 10 MG PO TABS: 10.0000 mg | ORAL_TABLET | Freq: Every day | ORAL | 3 refills | Status: DC

## 2020-08-10 NOTE — Progress Notes (Signed)
Name: Tamara Sparks   MRN: [id number]    DOB: 06-03-1928   Date:08/10/2020       Progress Note  Subjective:    Chief Complaint  Chief Complaint  Patient presents with  . Follow-up  . Hypertension  . Hyperlipidemia    I connected with  [name] on 08/10/20 at 10:40 AM EDT by telephone and verified that I am speaking with the correct person using two identifiers.  I discussed the limitations, risks, security and privacy concerns of performing an evaluation and management service by telephone and the availability of in person appointments. Staff also discussed with the patient that there may be a patient responsible charge related to this service. Patient Location: home Provider Location[facility] Additional Individuals present: [name]   HPI  Hypertension:  BP elevated today with home reading Currently managed on Amlodopine 5mg  qd and Metoprolol 50mg  qd - she still gets a little anxious about BP and has white coat HTN, previously had PRN BP meds but her daughter has had to take away her home BP cuff cause it was causing increased anxiety and worry about health and higher BP readings.  Currently BP fairly well controlled today here in office even though it tends to be a little higher in clinic.  Pt reports good med compliance and denies any SE. BP Readings from Last 3 Encounters:  08/10/20 (!) 170/80  07/09/20 (!) 159/54  02/11/20 (!) 144/72  reading today elevated 2/18 visit from ER visit for anxiety Pt denies CP, SOB, exertional sx, LE edema, palpitation, Ha's, visual disturbances, lightheadedness, hypotension, syncope.  Hyperlipidemia: Currently treated with Crestor 10mg  qd - we have had discussions about holding this med due to age, she did restart it last year and wants to continue med due to hx of CVA for her and hx of stroke and HTN in both her parents.  No SE or concerns restarting med, no SE or symptoms Last Lipids: well controlled Lab Results  Component  Value Date   CHOL 146 02/11/2020   HDL 50 02/11/2020   LDLCALC 73 02/11/2020   TRIG 152 (H) 02/11/2020   CHOLHDL 2.9 02/11/2020   - Denies: Chest pain, shortness of breath, myalgias, claudication  Anxiety and depression  Recent ER visit for anxiety -patient will not consistently take medications, possibly interested in taking medicines as needed, not interested in seeing a psychiatrist Depression screen [facility] 2/9 08/10/2020 02/11/2020 10/23/2019  Decreased Interest 0 0 0  Down, Depressed, Hopeless 0 0 1  PHQ - 2 Score 0 0 1  Altered sleeping 0 - 0  Tired, decreased energy 0 - 0  Change in appetite 0 - 0  Feeling bad or failure about yourself  0 - 1  Trouble concentrating 0 - 1  Moving slowly or fidgety/restless 0 - 0  Suicidal thoughts 0 - 0  PHQ-9 Score 0 - 3  Difficult doing work/chores Not difficult at all - Not difficult at all  Some recent data might be hidden   No flowsheet data found.   Patient Active Problem List   Diagnosis Date Noted  . MDD (major depressive disorder), single episode, mild (HCC) 10/06/2019  . Hypertensive urgency 07/05/2018  . Abnormal ankle brachial index (ABI) 03/11/2018  . Aortic atherosclerosis (HCC) 01/20/2018  . Multiple renal cysts 01/20/2018  . History of TB (tuberculosis) 10/05/2017  . History of lobectomy of lung 10/05/2017  . Dyslipidemia 09/23/2015  . Hyperglycemia 09/23/2015  . Seasonal allergies 09/09/2015  .  Dyslipidemia, goal LDL below 130 03/16/2015  . Anxiety disorder 12/08/2014  . Rapid heart beat 01/23/2014  . Diastolic murmur 01/23/2014  . Essential hypertension 01/23/2014    Current Outpatient Medications:  .  amLODipine (NORVASC) 5 MG tablet, TAKE 1 TABLET BY MOUTH EVERY DAY, Disp: 90 tablet, Rfl: 3 .  Cholecalciferol (VITAMIN D3) 5000 UNITS CAPS, Take 1 capsule by mouth daily. , Disp: , Rfl:  .  metoprolol succinate (TOPROL-XL) 50 MG 24 hr tablet, Take 1 tablet (50 mg total) by mouth daily. Take with or immediately  following a meal., Disp: 90 tablet, Rfl: 3 .  rosuvastatin (CRESTOR) 10 MG tablet, TAKE 1 TABLET BY MOUTH EVERY DAY, Disp: 90 tablet, Rfl: 0 Allergies  Allergen Reactions  . Sertraline   . Penicillins Rash    Has patient had a PCN reaction causing immediate rash, facial/tongue/throat swelling, SOB or lightheadedness with hypotension: Yes Has patient had a PCN reaction causing severe rash involving mucus membranes or skin necrosis: No Has patient had a PCN reaction that required hospitalization: No Has patient had a PCN reaction occurring within the last 10 years: No If all of the above answers are "NO", then may proceed with Cephalosporin use.  . Sulfa Antibiotics Other (See Comments)    Pt does not remember but thinks that it just didn't agree with her    Past Surgical History:  Procedure Laterality Date  . CARDIAC CATHETERIZATION     [facility]  . CATARACT EXTRACTION W/PHACO Right 03/08/2016   Procedure: CATARACT EXTRACTION PHACO AND INTRAOCULAR LENS PLACEMENT (IOC);  Surgeon: Sallee LangeSteven Dingeldein, MD;  Location: [facility] ORS;  Service: Ophthalmology;  Laterality: Right;   FLUID CASSETTE [id number] H, EXP 07/19/17 US    3:16.8   AP%   27.2 CDE 94.14  . EXTERNAL EAR SURGERY Left   . LOBECTOMY Right 1952   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17   Family History  Problem Relation Age of Onset  . Stroke Mother   . Hypertension Father   . Stroke Father   . Hypertension Sister   . Hypertension Brother   . Cancer Brother        brain  . Hypertension Sister   . Hypertension Brother   . Cancer Brother        stomach   Social History   Socioeconomic History  . Marital status: Widowed    Spouse name: Duanne GuessDewey  . Number of children: 4  . Years of education: Not on file  . Highest education level: 9th grade  Occupational History  . Occupation: Retired  Tobacco Use  . Smoking status: Never Smoker  . Smokeless tobacco: Never Used  . Tobacco comment: smoking cessation materials not required  Vaping  Use  . Vaping Use: Never used  Substance and Sexual Activity  . Alcohol use: No    Alcohol/week: 0.0 standard drinks  . Drug use: No  . Sexual activity: Not Currently  Other Topics Concern  . Not on file  Social History Narrative  . Not on file   Social Determinants of Health   Financial Resource Strain: Low Risk   . Difficulty of Paying Living Expenses: Not hard at all  Food Insecurity: No Food Insecurity  . Worried About Programme researcher, broadcasting/film/videounning Out of Food in the Last Year: Never true  . Ran Out of Food in the Last Year: Never true  Transportation Needs: No Transportation Needs  . Lack of Transportation (Medical): No  . Lack of Transportation (Non-Medical): No  Physical Activity:  Inactive  . Days of Exercise per Week: 0 days  . Minutes of Exercise per Session: 0 min  Stress: Stress Concern Present  . Feeling of Stress : Rather much  Social Connections: Unknown  . Frequency of Communication with Friends and Family: Patient refused  . Frequency of Social Gatherings with Friends and Family: Patient refused  . Attends Religious Services: Patient refused  . Active Member of Clubs or Organizations: Patient refused  . Attends Banker Meetings: Patient refused  . Marital Status: Widowed  Intimate Partner Violence: Not At Risk  . Fear of Current or Ex-Partner: No  . Emotionally Abused: No  . Physically Abused: No  . Sexually Abused: No    Chart Review Today:  Review of Systems  Unable to perform ROS: Other (pt not willing to talk to me on the phone - daughter doing visit for her)     Objective:    Virtual encounter, vitals limited, only able to obtain the following: Today's Vitals   08/10/20 1024  BP: (!) 170/80  Weight: 110 lb (49.9 kg)  Height: 5\' 1"  (1.549 m)   Body mass index is 20.78 kg/m. Nursing Note and Vital Signs reviewed.  Physical Exam Vitals and nursing note reviewed.     PE limited by telephone encounter   PHQ2/9: Depression screen [facility] 2/9  08/10/2020 02/11/2020 10/23/2019 10/06/2019 07/23/2019  Decreased Interest 0 0 0 0 1  Down, Depressed, Hopeless 0 0 1 1 1   PHQ - 2 Score 0 0 1 1 2   Altered sleeping 0 - 0 0 1  Tired, decreased energy 0 - 0 0 0  Change in appetite 0 - 0 0 0  Feeling bad or failure about yourself  0 - 1 1 0  Trouble concentrating 0 - 1 1 0  Moving slowly or fidgety/restless 0 - 0 0 0  Suicidal thoughts 0 - 0 0 0  PHQ-9 Score 0 - 3 3 3   Difficult doing work/chores Not difficult at all - Not difficult at all Not difficult at all -  Some recent data might be hidden   PHQ-2/9 Result is neg, reviewed today  Fall Risk: Fall Risk  08/10/2020 02/11/2020 10/23/2019 10/06/2019 07/23/2019  Falls in the past year? 0 0 0 0 0  Comment - - - - -  Number falls in past yr: 0 0 0 0 0  Injury with Fall? 0 0 0 0 0  Risk Factor Category  - - - - -  Risk for fall due to : - - Impaired balance/gait - -  Risk for fall due to: Comment - - - - -  Follow up - Falls evaluation completed Falls prevention discussed - -     Assessment and Plan:     ICD-10-CM   1. Essential hypertension  I10 amLODipine (NORVASC) 5 MG tablet    metoprolol succinate (TOPROL-XL) 50 MG 24 hr tablet  2. Dyslipidemia  E78.5 rosuvastatin (CRESTOR) 10 MG tablet  3. Encounter for medication monitoring  Z51.81   4. Essential hypertension  I10 amLODipine (NORVASC) 5 MG tablet    metoprolol succinate (TOPROL-XL) 50 MG 24 hr tablet   elevated today, may be due to anxiety, hx of labile BP, no current sx, continue norvasc and metoprolol, add hydralazine PRN to keep BP <160/100  5. Dyslipidemia  E78.5 rosuvastatin (CRESTOR) 10 MG tablet   tolerating statin, last lipids well controlled, discussed continuing vs d/c for her age, with intermittently high BP and no med  concerns, will continue statin  6. Aortic atherosclerosis (HCC)  I70.0   7. MDD (major depressive disorder), single episode, mild (HCC)  F32.0    Patient reported to her daughter and daughter reported to me  her mood is good and she does not feel depressed  8. Anxiety disorder, unspecified type  F41.9    Very anxious, have recommended daily medications and likely needs psychiatry and therapy, patient not cooperative, her own anxiety is a barrier to her care    I discussed the assessment and treatment plan with the patient. The patient was provided an opportunity to ask questions and all were answered. The patient agreed with the plan and demonstrated an understanding of the instructions.   The patient was advised to call back or seek an in-person evaluation if the symptoms worsen or if the condition fails to improve as anticipated.  I provided 20+ minutes of non-face-to-face time during this encounter.  Danelle Berry, PA-C 08/10/20 10:59 AM

## 2020-10-26 ENCOUNTER — Ambulatory Visit: Payer: Medicare Other

## 2020-11-11 ENCOUNTER — Telehealth: Payer: Self-pay | Admitting: Family Medicine

## 2020-11-11 NOTE — Telephone Encounter (Signed)
Copied from CRM [id number]. Topic: Medicare AWV >> Nov 11, 2020  2:29 PM Claudette Laws R wrote: Reason for CRM:  Left message for patient to call back and schedule Medicare Annual Wellness Visit (AWV) in office.   If unable to come into the office for AWV,  please offer to do virtually or by telephone.  Last AWV: 10/23/2019  Please schedule at anytime with [facility] Health Advisor.  40 minute appointment  Any questions, please contact me at [phone]

## 2020-12-21 ENCOUNTER — Telehealth: Payer: Self-pay | Admitting: Family Medicine

## 2020-12-21 NOTE — Telephone Encounter (Signed)
Copied from CRM [id number]. Topic: Medicare AWV >> Dec 21, 2020  2:29 PM Claudette Laws R wrote: Reason for CRM:  Left message for patient to call back and schedule Medicare Annual Wellness Visit (AWV) in office.   If unable to come into the office for AWV,  please offer to do virtually or by telephone.  Last AWV:  10/23/2019  Please schedule at anytime with [facility] Health Advisor.  40 minute appointment  Any questions, please contact me at [phone]

## 2021-01-03 ENCOUNTER — Telehealth: Payer: Self-pay | Admitting: Family Medicine

## 2021-01-03 NOTE — Telephone Encounter (Signed)
Copied from CRM [id number]. Topic: Medicare AWV >> Jan 03, 2021  9:33 AM Claudette Laws R wrote: Reason for CRM:  Left message for patient to call back and schedule Medicare Annual Wellness Visit (AWV) in office.   If unable to come into the office for AWV,  please offer to do virtually or by telephone.  Last AWV:  10/23/2019  Please schedule at anytime with [facility] Health Advisor.  40 minute appointment  Any questions, please contact me at [phone]

## 2021-01-18 ENCOUNTER — Telehealth: Payer: Self-pay | Admitting: Family Medicine

## 2021-01-18 NOTE — Telephone Encounter (Signed)
Copied from CRM [id number]. Topic: Medicare AWV >> Jan 18, 2021 10:17 AM Claudette Laws R wrote: Reason for CRM:  Left message for patient to call back and schedule Medicare Annual Wellness Visit (AWV) in office.   If unable to come into the office for AWV,  please offer to do virtually or by telephone.  Last AWV:  10/23/2019  Please schedule at anytime with [facility] Health Advisor.  40 minute appointment  Any questions, please contact me at [phone]

## 2021-02-04 ENCOUNTER — Telehealth: Payer: Self-pay | Admitting: *Deleted

## 2021-02-04 NOTE — Chronic Care Management (AMB) (Signed)
  Chronic Care Management   Outreach Note  02/04/2021 Name: LEILANA MCQUIRE MRN: [id number] DOB: 08/22/1928  [name] is a 87 y.o. year old female who is a primary care patient of Danelle Berry, [place]. I reached out to [name] by phone today in response to a referral sent by Ms. Valetta Mole Lorenzi's PCP Danelle Berry, PA-C     An unsuccessful telephone outreach was attempted today. The patient was referred to the case management team for assistance with care management and care coordination.   Follow Up Plan: A HIPAA compliant phone message was left for the patient providing contact information and requesting a return call.  If patient returns call to provider office, please advise to call Embedded Care Management Care Guide Connor Foxworthy at [phone]  Burman Nieves, CCMA Care Guide, Embedded Care Coordination [facility]  Care Management  Direct Dial: [phone]

## 2021-02-11 NOTE — Chronic Care Management (AMB) (Signed)
  Chronic Care Management   Outreach Note  02/11/2021 Name: YIZEL CANBY MRN: [id number] DOB: Oct 13, 1928  [name] is a 87 y.o. year old female who is a primary care patient of Danelle Berry, [place]. I reached out to [name] by phone today in response to a referral sent by Ms. Valetta Mole Eline's PCP Danelle Berry, PA-C     A second unsuccessful telephone outreach was attempted today. The patient was referred to the case management team for assistance with care management and care coordination.   Follow Up Plan: A HIPAA compliant phone message was left for the patient providing contact information and requesting a return call.  If patient returns call to provider office, please advise to call Embedded Care Management Care Guide Brookelynne Dimperio at [phone]  Burman Nieves, CCMA Care Guide, Embedded Care Coordination [facility] Health  Care Management  Direct Dial: [phone]

## 2021-02-15 NOTE — Chronic Care Management (AMB) (Signed)
  Chronic Care Management   Note  02/15/2021 Name: Tamara Sparks MRN: [id number] DOB: [birth year]  [name] is a 87 y.o. year old female who is a primary care patient of Delsa Grana, [place]. I reached out to [name] by phone today in response to a referral sent by Ms. Elba PCP.  Ms. Livesay was given information about Chronic Care Management services today including:  CCM service includes personalized support from designated clinical staff supervised by her physician, including individualized plan of care and coordination with other care providers 24/7 contact phone numbers for assistance for urgent and routine care needs. Service will only be billed when office clinical staff spend 20 minutes or more in a month to coordinate care. Only one practitioner may furnish and bill the service in a calendar month. The patient may stop CCM services at any time (effective at the end of the month) by phone call to the office staff. The patient is responsible for co-pay (up to 20% after annual deductible is met) if co-pay is required by the individual health plan.   Patient agreed to services and verbal consent obtained.   Follow up plan: Telephone appointment with care management team member scheduled for: 03/02/2021  Julian Hy, [place] Management  Direct Dial: [phone]

## 2021-03-02 ENCOUNTER — Ambulatory Visit (INDEPENDENT_AMBULATORY_CARE_PROVIDER_SITE_OTHER): Payer: Medicare Other

## 2021-03-02 DIAGNOSIS — I10 Essential (primary) hypertension: Secondary | ICD-10-CM

## 2021-03-02 DIAGNOSIS — E785 Hyperlipidemia, unspecified: Secondary | ICD-10-CM

## 2021-03-02 NOTE — Chronic Care Management (AMB) (Signed)
Chronic Care Management   CCM RN Visit Note  03/02/2021 Name: Tamara Sparks MRN: [id number] DOB: 10-25-1928  Subjective: Tamara Sparks is a 87 y.o. year old female who is a primary care patient of Delsa Grana, [place]. The care management team was consulted for assistance with disease management and care coordination needs.    Engaged with patient by telephone for initial visit in response to provider referral for case management and care coordination services.   Consent to Services:  The patient was given the following information about Chronic Care Management services: 1. CCM service includes personalized support from designated clinical staff supervised by the primary care provider, including individualized plan of care and coordination with other care providers 2. 24/7 contact phone numbers for assistance for urgent and routine care needs. 3. Service will only be billed when office clinical staff spend 20 minutes or more in a month to coordinate care. 4. Only one practitioner may furnish and bill the service in a calendar month. 5.The patient may stop CCM services at any time (effective at the end of the month) by phone call to the office staff. 6. The patient will be responsible for cost sharing (co-pay) of up to 20% of the service fee (after annual deductible is met). Patient declined need for further care management outreach.   Assessment: Review of patient past medical history, allergies, medications, health status, including review of consultants reports, laboratory and other test data, was performed as part of comprehensive evaluation and provision of chronic care management services.   SDOH (Social Determinants of Health) assessments and interventions performed:  SDOH Interventions    Flowsheet Row Most Recent Value  SDOH Interventions   Food Insecurity Interventions Intervention Not Indicated  Transportation Interventions Intervention Not Indicated        CCM Care  Plan  Allergies  Allergen Reactions   Sertraline    Penicillins Rash    Has patient had a PCN reaction causing immediate rash, facial/tongue/throat swelling, SOB or lightheadedness with hypotension: Yes Has patient had a PCN reaction causing severe rash involving mucus membranes or skin necrosis: No Has patient had a PCN reaction that required hospitalization: No Has patient had a PCN reaction occurring within the last 10 years: No If all of the above answers are "NO", then may proceed with Cephalosporin use.   Sulfa Antibiotics Other (See Comments)    Pt does not remember but thinks that it just didn't agree with her    Outpatient Encounter Medications as of 03/02/2021  Medication Sig   amLODipine (NORVASC) 5 MG tablet TAKE 1 TABLET BY MOUTH EVERY DAY   Cholecalciferol (VITAMIN D3) 5000 UNITS CAPS Take 1 capsule by mouth daily.    metoprolol succinate (TOPROL-XL) 50 MG 24 hr tablet Take 1 tablet (50 mg total) by mouth daily. Take with or immediately following a meal.   rosuvastatin (CRESTOR) 10 MG tablet Take 1 tablet (10 mg total) by mouth daily.   No facility-administered encounter medications on file as of 03/02/2021.    Patient Active Problem List   Diagnosis Date Noted   MDD (major depressive disorder), single episode, mild ([place]) 10/06/2019   Hypertensive urgency 07/05/2018   Abnormal ankle brachial index (ABI) 03/11/2018   Aortic atherosclerosis ([place]) 01/20/2018   Multiple renal cysts 01/20/2018   History of TB (tuberculosis) 10/05/2017   History of lobectomy of lung 10/05/2017   Dyslipidemia 09/23/2015   Hyperglycemia 09/23/2015   Seasonal allergies 09/09/2015   Dyslipidemia, goal LDL below 130 03/16/2015  Anxiety disorder 12/08/2014   Rapid heart beat 56/38/9373   Diastolic murmur 42/87/6811   Essential hypertension 01/23/2014    Tamara Sparks reports doing well with medications and self-care. Reports excellent medication compliance and managing medications well  with a pillbox. Declines need for assistance with hypertension and hyperlipidemia management. Reports good family support and indicates her daughter is assisting with transportation and managing her care. Declines urgent concerns or need for care management outreach. Agreed to contact the clinic if her health needs change and outreach is required. The care management team will gladly assist.   Horris Latino [facility] Care Management [facility] [phone]

## 2021-03-21 DIAGNOSIS — I10 Essential (primary) hypertension: Secondary | ICD-10-CM | POA: Diagnosis not present

## 2021-03-21 DIAGNOSIS — E785 Hyperlipidemia, unspecified: Secondary | ICD-10-CM

## 2021-08-05 ENCOUNTER — Other Ambulatory Visit: Payer: Self-pay | Admitting: Family Medicine

## 2021-08-05 DIAGNOSIS — I10 Essential (primary) hypertension: Secondary | ICD-10-CM

## 2021-08-05 NOTE — Telephone Encounter (Signed)
Lvm to schedule appt soon and that a 30 day supply for the requested meds was called in to [facility] on [address]

## 2021-08-05 NOTE — Telephone Encounter (Signed)
Pt needs appt has not been seen in a yr. Only 30 days given of bp meds ?

## 2021-08-19 ENCOUNTER — Encounter: Payer: Self-pay | Admitting: Family Medicine

## 2021-08-19 ENCOUNTER — Ambulatory Visit (INDEPENDENT_AMBULATORY_CARE_PROVIDER_SITE_OTHER): Payer: Medicare Other | Admitting: Family Medicine

## 2021-08-19 VITALS — BP 156/70 | HR 88 | Temp 97.7°F | Resp 18 | Ht 60.0 in | Wt 110.0 lb

## 2021-08-19 DIAGNOSIS — E785 Hyperlipidemia, unspecified: Secondary | ICD-10-CM

## 2021-08-19 DIAGNOSIS — H9193 Unspecified hearing loss, bilateral: Secondary | ICD-10-CM

## 2021-08-19 DIAGNOSIS — E559 Vitamin D deficiency, unspecified: Secondary | ICD-10-CM | POA: Diagnosis not present

## 2021-08-19 DIAGNOSIS — I700 Atherosclerosis of aorta: Secondary | ICD-10-CM

## 2021-08-19 DIAGNOSIS — Z5181 Encounter for therapeutic drug level monitoring: Secondary | ICD-10-CM

## 2021-08-19 DIAGNOSIS — F320 Major depressive disorder, single episode, mild: Secondary | ICD-10-CM

## 2021-08-19 DIAGNOSIS — Z7189 Other specified counseling: Secondary | ICD-10-CM | POA: Diagnosis not present

## 2021-08-19 DIAGNOSIS — R739 Hyperglycemia, unspecified: Secondary | ICD-10-CM

## 2021-08-19 DIAGNOSIS — H5789 Other specified disorders of eye and adnexa: Secondary | ICD-10-CM

## 2021-08-19 DIAGNOSIS — I10 Essential (primary) hypertension: Secondary | ICD-10-CM

## 2021-08-19 DIAGNOSIS — J302 Other seasonal allergic rhinitis: Secondary | ICD-10-CM | POA: Diagnosis not present

## 2021-08-19 DIAGNOSIS — F419 Anxiety disorder, unspecified: Secondary | ICD-10-CM | POA: Diagnosis not present

## 2021-08-19 DIAGNOSIS — Z23 Encounter for immunization: Secondary | ICD-10-CM

## 2021-08-19 MED ORDER — AMLODIPINE BESYLATE 5 MG PO TABS: 5.0000 mg | ORAL_TABLET | Freq: Every day | ORAL | 1 refills | Status: DC

## 2021-08-19 MED ORDER — ROSUVASTATIN CALCIUM 10 MG PO TABS: 10.0000 mg | ORAL_TABLET | Freq: Every day | ORAL | 3 refills | Status: DC

## 2021-08-19 MED ORDER — METOPROLOL SUCCINATE ER 50 MG PO TB24: 50.0000 mg | ORAL_TABLET | Freq: Every day | ORAL | 1 refills | Status: DC

## 2021-08-19 NOTE — Progress Notes (Signed)
? ?Name: Tamara Sparks   MRN: [id number]    DOB: 05-15-1929   Date:08/19/2021 ? ?     Progress Note ? ?Chief Complaint  ?Patient presents with  ? Follow-up  ? Hypertension  ? Hyperlipidemia  ? Anxiety  ? Depression  ? ? ? ?Subjective:  ? ?Tamara Sparks is a 88 y.o. female, presents to clinic for routine f/up ? ?HTN - BP is usually high in office and better at home ?At her baseline today for in office reading due to white coat HTN ?Currently managed on amlodipine 5 mg and metoprolol XL 50 mg  ?Pt reports good med compliance and denies any SE.   ?Blood pressure today is well controlled. ?BP Readings from Last 3 Encounters:  ?08/19/21 (!) 156/70  ?08/10/20 (!) 170/80  ?07/09/20 (!) 159/54  ? ?Pt denies CP, SOB, exertional sx, LE edema, palpitation, Ha's, visual disturbances, lightheadedness, hypotension, syncope. ? ?HLD - on crestor 10, good compliance no SE or concerns ?Lab Results  ?Component Value Date  ? CHOL 146 02/11/2020  ? HDL 50 02/11/2020  ? [place] 73 02/11/2020  ? TRIG 152 (H) 02/11/2020  ? CHOLHDL 2.9 02/11/2020  ? ?Blood sugar elevated w/o recent A1C checked ? ?On higher dose Vit D suppplement, daughter states she doesn't take every single day, no dexa in chart ?No recent falls, has walker she uses regularly, grab bars and hand rails everywhere in home ? ?ACP - pt lives alone, has life alert, no advanced directives done, is not DNR  ?Her daughters call and check on her every day ?Here with her daughter Tamara Sparks - cell in chart is Linda's ? ?Anxiety - previously stopped lexapro med, anxiety sx are well controlled still w/o meds and pts mood is reportedly good, no depressive sx ? ?  08/19/2021  ?  9:21 AM 08/10/2020  ? 10:25 AM 02/11/2020  ? 10:40 AM  ?Depression screen PHQ 2/9  ?Decreased Interest 2 0 0  ?Down, Depressed, Hopeless 2 0 0  ?PHQ - 2 Score 4 0 0  ?Altered sleeping 0 0   ?Tired, decreased energy 0 0   ?Change in appetite 0 0   ?Feeling bad or failure about yourself  0 0   ?Trouble  concentrating 0 0   ?Moving slowly or fidgety/restless 0 0   ?Suicidal thoughts 0 0   ?PHQ-9 Score 4 0   ?Difficult doing work/chores Somewhat difficult Not difficult at all   ? ? ?  08/19/2021  ?  9:33 AM  ?GAD 7 : Generalized Anxiety Score  ?Nervous, Anxious, on Edge 1  ?Control/stop worrying 1  ?Worry too much - different things 1  ?Trouble relaxing 0  ?Restless 0  ?Easily annoyed or irritable 0  ?Afraid - awful might happen 1  ?Total GAD 7 Score 4  ?Anxiety Difficulty Somewhat difficult  ? ?Left ear stopped up, seasonal allergies ?No change to [facility] ? ? ? ?Current Outpatient Medications:  ?  Cholecalciferol (VITAMIN D3) 5000 UNITS CAPS, Take 1 capsule by mouth daily. , Disp: , Rfl:  ?  amLODipine (NORVASC) 5 MG tablet, Take 1 tablet (5 mg total) by mouth daily., Disp: 90 tablet, Rfl: 1 ?  metoprolol succinate (TOPROL-XL) 50 MG 24 hr tablet, Take 1 tablet (50 mg total) by mouth daily. TAKE WITH OR IMMEDIATELY FOLLOWING A MEAL., Disp: 90 tablet, Rfl: 1 ?  rosuvastatin (CRESTOR) 10 MG tablet, Take 1 tablet (10 mg total) by mouth daily., Disp: 90 tablet, Rfl: 3 ? ?  Patient Active Problem List  ? Diagnosis Date Noted  ? MDD (major depressive disorder), single episode, mild ([place]) 10/06/2019  ? Hypertensive urgency 07/05/2018  ? Abnormal ankle brachial index (ABI) 03/11/2018  ? Aortic atherosclerosis ([place]) 01/20/2018  ? Multiple renal cysts 01/20/2018  ? History of TB (tuberculosis) 10/05/2017  ? History of lobectomy of lung 10/05/2017  ? Dyslipidemia 09/23/2015  ? Hyperglycemia 09/23/2015  ? Seasonal allergies 09/09/2015  ? Dyslipidemia, goal LDL below 130 03/16/2015  ? Anxiety disorder 12/08/2014  ? Rapid heart beat 01/23/2014  ? Diastolic murmur 99991111  ? Essential hypertension 01/23/2014  ? ? ?Past Surgical History:  ?Procedure Laterality Date  ? CARDIAC CATHETERIZATION    ? [place]  ? CATARACT EXTRACTION W/PHACO Right 03/08/2016  ? Procedure: CATARACT EXTRACTION PHACO AND INTRAOCULAR LENS PLACEMENT (IOC);  Surgeon:  Estill Cotta, MD;  Location: [facility] ORS;  Service: Ophthalmology;  Laterality: Right;   ?FLUID CASSETTE [id number] H, EXP 07/19/17 ?[place]    3:16.8   ?AP%   27.2 ?CDE 94.14  ? EXTERNAL EAR SURGERY Left   ? LOBECTOMY Right 1952  ? 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89  ? ? ?Family History  ?Problem Relation Age of Onset  ? Stroke Mother   ? Hypertension Father   ? Stroke Father   ? Hypertension Sister   ? Hypertension Brother   ? Cancer Brother   ?     brain  ? Hypertension Sister   ? Hypertension Brother   ? Cancer Brother   ?     stomach  ? ? ?Social History  ? ?Tobacco Use  ? Smoking status: Never  ? Smokeless tobacco: Never  ? Tobacco comments:  ?  smoking cessation materials not required  ?Vaping Use  ? Vaping Use: Never used  ?Substance Use Topics  ? Alcohol use: No  ?  Alcohol/week: 0.0 standard drinks  ? Drug use: No  ?  ? ?Allergies  ?Allergen Reactions  ? Sertraline   ? Penicillins Rash  ?  Has patient had a PCN reaction causing immediate rash, facial/tongue/throat swelling, SOB or lightheadedness with hypotension: Yes ?Has patient had a PCN reaction causing severe rash involving mucus membranes or skin necrosis: No ?Has patient had a PCN reaction that required hospitalization: No ?Has patient had a PCN reaction occurring within the last 10 years: No ?If all of the above answers are "NO", then may proceed with Cephalosporin use.  ? Sulfa Antibiotics Other (See Comments)  ?  Pt does not remember but thinks that it just didn't agree with her  ? ? ?Health Maintenance  ?Topic Date Due  ? INFLUENZA VACCINE  08/19/2021 (Originally 12/20/2020)  ? Pneumonia Vaccine 76+ Years old  Completed  ? HPV VACCINES  Aged Out  ? DEXA SCAN  Discontinued  ? TETANUS/TDAP  Discontinued  ? COVID-19 Vaccine  Discontinued  ? Zoster Vaccines- Shingrix  Discontinued  ? ? ?Chart Review Today: ?I personally reviewed active problem list, medication list, allergies, family history, social history, health maintenance, notes from last encounter, lab  results, imaging with the patient/caregiver today. ? ? ?Review of Systems  ?Constitutional: Negative.   ?HENT: Negative.    ?Eyes: Negative.   ?Respiratory: Negative.    ?Cardiovascular: Negative.   ?Gastrointestinal: Negative.   ?Endocrine: Negative.   ?Genitourinary: Negative.   ?Musculoskeletal: Negative.   ?Skin: Negative.   ?Allergic/Immunologic: Negative.   ?Neurological: Negative.   ?Hematological: Negative.   ?Psychiatric/Behavioral: Negative.    ?All other systems reviewed and are  negative.  ? ?Objective:  ? ?Vitals:  ? 08/19/21 0934 08/19/21 0943  ?BP: (!) 152/70 (!) 156/70  ?Pulse: 88   ?Resp: 18   ?Temp: 97.7 ?F (36.5 ?C)   ?TempSrc: Oral   ?SpO2: 98%   ?Weight: 110 lb (49.9 kg)   ?Height: 5' (1.524 m)   ? ? ?Body mass index is 21.48 kg/m?. ? ?Physical Exam ?Vitals and nursing note reviewed.  ?Constitutional:   ?   General: She is not in acute distress. ?   Appearance: Normal appearance. She is well-developed and normal weight. She is not ill-appearing, toxic-appearing or diaphoretic.  ?   Interventions: Face mask in place.  ?HENT:  ?   Head: Normocephalic and atraumatic.  ?   Right Ear: Tympanic membrane, ear canal and external ear normal. Decreased hearing noted. There is no impacted cerumen.  ?   Left Ear: Tympanic membrane, ear canal and external ear normal. Decreased hearing noted. There is no impacted cerumen.  ?   Ears:  ?   Comments: HOH - baseline ?Surgical changes to left TM but translucent, no effusion ?   Mouth/Throat:  ?   Mouth: Mucous membranes are moist.  ?   Pharynx: Oropharynx is clear.  ?Eyes:  ?   General: Lids are normal. No scleral icterus.    ?   Right eye: No discharge.     ?   Left eye: Discharge present. ?   Conjunctiva/sclera: Conjunctivae normal.  ?   Pupils: Pupils are equal, round, and reactive to light.  ?Neck:  ?   Trachea: Phonation normal. No tracheal deviation.  ?Cardiovascular:  ?   Rate and Rhythm: Normal rate and regular rhythm.  ?   Pulses: Normal pulses.     ?      Radial pulses are 2+ on the right side and 2+ on the left side.  ?     Posterior tibial pulses are 2+ on the right side and 2+ on the left side.  ?   Heart sounds: Normal heart sounds. No murmur heard. ?  No fricti

## 2021-08-19 NOTE — Progress Notes (Signed)
?   08/19/21 1145  ?Advance Directives (For Healthcare)  ?Does Patient Have a Medical Advance Directive? No  ?Would patient like information on creating a medical advance directive? Yes (ED - Information included in AVS)  ?Mental Health Advance Directives  ?Does Patient Have a Mental Health Advance Directive? No  ?Would patient like information on creating a mental health advance directive? No - Patient declined  ? ? ?Packet given to pt and daughter [name] today - encouraged to complete and return with copy to scan to chart. ? ?

## 2021-08-23 LAB — CBC WITH DIFFERENTIAL/PLATELET
Absolute Monocytes: 562 cells/uL (ref 200–950)
Basophils Absolute: 58 cells/uL (ref 0–200)
Basophils Relative: 0.8 %
Eosinophils Absolute: 209 cells/uL (ref 15–500)
Eosinophils Relative: 2.9 %
HCT: 45 % (ref 35.0–45.0)
Hemoglobin: 14.8 g/dL (ref 11.7–15.5)
Lymphs Abs: 1433 cells/uL (ref 850–3900)
MCH: 29.5 pg (ref 27.0–33.0)
MCHC: 32.9 g/dL (ref 32.0–36.0)
MCV: 89.6 fL (ref 80.0–100.0)
MPV: 9.4 fL (ref 7.5–12.5)
Monocytes Relative: 7.8 %
Neutro Abs: 4939 cells/uL (ref 1500–7800)
Neutrophils Relative %: 68.6 %
Platelets: 359 10*3/uL (ref 140–400)
RBC: 5.02 10*6/uL (ref 3.80–5.10)
RDW: 12.6 % (ref 11.0–15.0)
Total Lymphocyte: 19.9 %
WBC: 7.2 10*3/uL (ref 3.8–10.8)

## 2021-08-23 LAB — COMPLETE METABOLIC PANEL WITH GFR
AG Ratio: 1.6 (calc) (ref 1.0–2.5)
ALT: 17 U/L (ref 6–29)
AST: 25 U/L (ref 10–35)
Albumin: 4.8 g/dL (ref 3.6–5.1)
Alkaline phosphatase (APISO): 89 U/L (ref 37–153)
BUN: 11 mg/dL (ref 7–25)
CO2: 29 mmol/L (ref 20–32)
Calcium: 10.8 mg/dL — ABNORMAL HIGH (ref 8.6–10.4)
Chloride: 104 mmol/L (ref 98–110)
Creat: 0.9 mg/dL (ref 0.60–0.95)
Globulin: 3 g/dL (calc) (ref 1.9–3.7)
Glucose, Bld: 107 mg/dL — ABNORMAL HIGH (ref 65–99)
Potassium: 4 mmol/L (ref 3.5–5.3)
Sodium: 142 mmol/L (ref 135–146)
Total Bilirubin: 0.7 mg/dL (ref 0.2–1.2)
Total Protein: 7.8 g/dL (ref 6.1–8.1)
eGFR: 61 mL/min/{1.73_m2} (ref >=60)

## 2021-08-23 LAB — LIPID PANEL
Cholesterol: 159 mg/dL (ref <200)
HDL: 64 mg/dL (ref >=50)
LDL Cholesterol (Calc): 75 mg/dL (calc)
Non-HDL Cholesterol (Calc): 95 mg/dL (calc) (ref <130)
Total CHOL/HDL Ratio: 2.5 (calc) (ref <5.0)
Triglycerides: 115 mg/dL (ref <150)

## 2021-08-23 LAB — VITAMIN D 1,25 DIHYDROXY
Vitamin D 1, 25 (OH)2 Total: 71 pg/mL (ref 18–72)
Vitamin D2 1, 25 (OH)2: <8 pg/mL
Vitamin D3 1, 25 (OH)2: 71 pg/mL

## 2021-08-23 LAB — HEMOGLOBIN A1C
Hgb A1c MFr Bld: 5.4 % of total Hgb (ref <5.7)
Mean Plasma Glucose: 108 mg/dL
eAG (mmol/L): 6 mmol/L

## 2021-09-08 ENCOUNTER — Other Ambulatory Visit: Payer: Self-pay | Admitting: Family Medicine

## 2021-09-08 DIAGNOSIS — I10 Essential (primary) hypertension: Secondary | ICD-10-CM

## 2021-09-08 MED ORDER — AMLODIPINE BESYLATE 5 MG PO TABS: 5.0000 mg | ORAL_TABLET | Freq: Every day | ORAL | 0 refills | Status: DC

## 2021-09-08 NOTE — Telephone Encounter (Signed)
Pt is not able to get her refill for amLODipine (NORVASC) 5 MG tablet until 4.27.23/ [facility] advised her of this/ she has 2 pills and needs a few more pills to get her to 4.27.23/ she will need a [id number] [address], [facility] [address]  ?[address] [place] [address]  ?Phone:  [phone] for 5 pills / please advise  ?

## 2021-09-08 NOTE — Telephone Encounter (Signed)
Call to daughter- mother is running short on Rx- needs 5 pills ?Requested Prescriptions  ?Pending Prescriptions Disp Refills  ?? amLODipine (NORVASC) 5 MG tablet 5 tablet 0  ?  Sig: Take 1 tablet (5 mg total) by mouth daily.  ?  ? Cardiovascular: Calcium Channel Blockers 2 Failed - 09/08/2021 10:25 AM  ?  ?  Failed - Last BP in normal range  ?  BP Readings from Last 1 Encounters:  ?08/19/21 (!) 156/70  ?   ?  ?  Passed - Last Heart Rate in normal range  ?  Pulse Readings from Last 1 Encounters:  ?08/19/21 88  ?   ?  ?  Passed - Valid encounter within last 6 months  ?  Recent Outpatient Visits   ?      ? 2 weeks ago Essential hypertension  ? [facility] Danelle Berry, PA-C  ? 1 year ago Essential hypertension  ? [facility] Danelle Berry, PA-C  ? 1 year ago Essential hypertension  ? [facility] Danelle Berry, PA-C  ? 1 year ago Essential hypertension  ? [facility] Danelle Berry, PA-C  ? 2 years ago Essential hypertension  ? [facility] Danelle Berry, [place]  ?  ?  ?Future Appointments   ?        ? In 5 months Danelle Berry, PA-C [facility]  ?  ? ?  ?  ?  ? ? ?

## 2022-02-16 ENCOUNTER — Ambulatory Visit: Payer: Medicare Other | Admitting: Family Medicine

## 2022-03-07 ENCOUNTER — Other Ambulatory Visit: Payer: Self-pay | Admitting: Family Medicine

## 2022-03-07 DIAGNOSIS — I10 Essential (primary) hypertension: Secondary | ICD-10-CM

## 2022-03-14 ENCOUNTER — Encounter: Payer: Self-pay | Admitting: Family Medicine

## 2022-03-14 ENCOUNTER — Ambulatory Visit (INDEPENDENT_AMBULATORY_CARE_PROVIDER_SITE_OTHER): Payer: Medicare Other | Admitting: Family Medicine

## 2022-03-14 VITALS — BP 184/70 | HR 77 | Temp 97.9°F | Resp 16 | Ht 60.0 in | Wt 109.7 lb

## 2022-03-14 DIAGNOSIS — Z5181 Encounter for therapeutic drug level monitoring: Secondary | ICD-10-CM

## 2022-03-14 DIAGNOSIS — F320 Major depressive disorder, single episode, mild: Secondary | ICD-10-CM

## 2022-03-14 DIAGNOSIS — I10 Essential (primary) hypertension: Secondary | ICD-10-CM | POA: Diagnosis not present

## 2022-03-14 DIAGNOSIS — I700 Atherosclerosis of aorta: Secondary | ICD-10-CM | POA: Diagnosis not present

## 2022-03-14 DIAGNOSIS — R739 Hyperglycemia, unspecified: Secondary | ICD-10-CM | POA: Diagnosis not present

## 2022-03-14 DIAGNOSIS — F419 Anxiety disorder, unspecified: Secondary | ICD-10-CM

## 2022-03-14 DIAGNOSIS — Z710 Person encountering health services to consult on behalf of another person: Secondary | ICD-10-CM | POA: Diagnosis not present

## 2022-03-14 DIAGNOSIS — Z23 Encounter for immunization: Secondary | ICD-10-CM

## 2022-03-14 DIAGNOSIS — E785 Hyperlipidemia, unspecified: Secondary | ICD-10-CM

## 2022-03-14 NOTE — Assessment & Plan Note (Signed)
On statin, good compliance

## 2022-03-14 NOTE — Progress Notes (Signed)
Name: Tamara Sparks   MRN: [id number]    DOB: [birth year]   Date:03/14/2022       Progress Note  Chief Complaint  Patient presents with   Follow-up   Hypertension   Hyperlipidemia   Anxiety     Subjective:   Tamara Sparks is a 88 y.o. female, presents to clinic for routine f/up, here with her Daughter Tamara Sparks Pt extremely anxious, tearful and just fraught today  Social anxiety - doesn't want to be around people, never has, severe fear of going anywhere won't doctor or getting hair done  Still lives by herself - three daughters living near by and they have a plan to help her - right now they visit her and take turns Anxiety meds in the past made her feel worse, getting BP checked even at home causes severe anxiety    03/14/2022    8:59 AM 08/19/2021    9:21 AM 08/10/2020   10:25 AM  Depression screen PHQ 2/9  Decreased Interest 1 2 0  Down, Depressed, Hopeless 1 2 0  PHQ - 2 Score 2 4 0  Altered sleeping 1 0 0  Tired, decreased energy 1 0 0  Change in appetite 0 0 0  Feeling bad or failure about yourself  0 0 0  Trouble concentrating 0 0 0  Moving slowly or fidgety/restless 0 0 0  Suicidal thoughts 0 0 0  PHQ-9 Score 4 4 0  Difficult doing work/chores Somewhat difficult Somewhat difficult Not difficult at all      03/14/2022    8:59 AM 08/19/2021    9:33 AM  GAD 7 : Generalized Anxiety Score  Nervous, Anxious, on Edge 0 1  Control/stop worrying 0 1  Worry too much - different things 0 1  Trouble relaxing 0 0  Restless 0 0  Easily annoyed or irritable 0 0  Afraid - awful might happen 0 1  Total GAD 7 Score 0 4  Anxiety Difficulty Not difficult at all Somewhat difficult   At home she if less anxious when just left alone  HTN on metoprolol and amlodipine BP Readings from Last 3 Encounters:  03/14/22 (!) 184/70  08/19/21 (!) 156/70  08/10/20 (!) 170/80   She denies CP, HA, SOB, palpitations, LE edema, vision changes, DOE, orthopnea  HLD on crestor 10  good compliance Last lipids reviewed Lab Results  Component Value Date   CHOL 159 08/19/2021   HDL 64 08/19/2021   LDLCALC 75 08/19/2021   TRIG 115 08/19/2021   CHOLHDL 2.5 08/19/2021   Calcium high last OV, likely pt is not taking Vit D  Pt does not have ACP done - Tamara Sparks on chart here with her,  Sisters Insurance claims handler, [name] ([place] moving nearby soon) Sisters do not like to talk about advance care planning, packet given today Pt home alone, has life alert, generally seen by family member once a day  If something happens the sisters plan to take turns staying with her and caring for her     Current Outpatient Medications:    amLODipine (NORVASC) 5 MG tablet, TAKE 1 TABLET (5 MG TOTAL) BY MOUTH DAILY., Disp: 30 tablet, Rfl: 0   metoprolol succinate (TOPROL-XL) 50 MG 24 hr tablet, TAKE 1 TABLET BY MOUTH DAILY. TAKE WITH OR IMMEDIATELY FOLLOWING A MEAL., Disp: 30 tablet, Rfl: 0   rosuvastatin (CRESTOR) 10 MG tablet, Take 1 tablet (10 mg total) by mouth daily., Disp: 90 tablet, Rfl: 3  Cholecalciferol (VITAMIN D3) 5000 UNITS CAPS, Take 1 capsule by mouth daily.  (Patient not taking: Reported on 03/14/2022), Disp: , Rfl:   Patient Active Problem List   Diagnosis Date Noted   MDD (major depressive disorder), single episode, mild (HCC) 10/06/2019   Hypertensive urgency 07/05/2018   Abnormal ankle brachial index (ABI) 03/11/2018   Aortic atherosclerosis (HCC) 01/20/2018   Multiple renal cysts 01/20/2018   History of TB (tuberculosis) 10/05/2017   History of lobectomy of lung 10/05/2017   Dyslipidemia 09/23/2015   Hyperglycemia 09/23/2015   Seasonal allergies 09/09/2015   Dyslipidemia, goal LDL below 130 03/16/2015   Anxiety disorder 12/08/2014   Rapid heart beat 01/23/2014   Diastolic murmur 01/23/2014   Essential hypertension 01/23/2014    Past Surgical History:  Procedure Laterality Date   CARDIAC CATHETERIZATION     [facility]   CATARACT EXTRACTION W/PHACO  Right 03/08/2016   Procedure: CATARACT EXTRACTION PHACO AND INTRAOCULAR LENS PLACEMENT (IOC);  Surgeon: Sallee Lange, MD;  Location: [facility] ORS;  Service: Ophthalmology;  Laterality: Right;   FLUID CASSETTE [id number] H, EXP 07/19/17 [place]    3:16.8   AP%   27.2 CDE 94.14   EXTERNAL EAR SURGERY Left    LOBECTOMY Right 1952   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17    Family History  Problem Relation Age of Onset   Stroke Mother    Hypertension Father    Stroke Father    Hypertension Sister    Hypertension Brother    Cancer Brother        brain   Hypertension Sister    Hypertension Brother    Cancer Brother        stomach    Social History   Tobacco Use   Smoking status: Never   Smokeless tobacco: Never   Tobacco comments:    smoking cessation materials not required  Vaping Use   Vaping Use: Never used  Substance Use Topics   Alcohol use: No    Alcohol/week: 0.0 standard drinks of alcohol   Drug use: No     Allergies  Allergen Reactions   Sertraline    Penicillins Rash    Has patient had a PCN reaction causing immediate rash, facial/tongue/throat swelling, SOB or lightheadedness with hypotension: Yes Has patient had a PCN reaction causing severe rash involving mucus membranes or skin necrosis: No Has patient had a PCN reaction that required hospitalization: No Has patient had a PCN reaction occurring within the last 10 years: No If all of the above answers are "NO", then may proceed with Cephalosporin use.   Sulfa Antibiotics Other (See Comments)    Pt does not remember but thinks that it just didn't agree with her    Health Maintenance  Topic Date Due   Medicare Annual Wellness (AWV)  11/21/2020   Pneumonia Vaccine 54+ Years old  Completed   INFLUENZA VACCINE  Completed   HPV VACCINES  Aged Out   DEXA SCAN  Discontinued   TETANUS/TDAP  Discontinued   COVID-19 Vaccine  Discontinued   Zoster Vaccines- Shingrix  Discontinued    Chart Review Today: I personally  reviewed active problem list, medication list, allergies, family history, social history, health maintenance, notes from last encounter, lab results, imaging with the patient/caregiver today.   Review of Systems  Constitutional: Negative.   HENT: Negative.    Eyes: Negative.   Respiratory: Negative.    Cardiovascular: Negative.   Gastrointestinal: Negative.   Endocrine: Negative.   Genitourinary: Negative.  Musculoskeletal: Negative.   Skin: Negative.   Allergic/Immunologic: Negative.   Neurological: Negative.   Hematological: Negative.   Psychiatric/Behavioral: Negative.    All other systems reviewed and are negative.    Objective:   Vitals:   03/14/22 0912 03/14/22 0925  BP: (!) 190/82 (!) 184/70  Pulse: 77   Resp: 16   Temp: 97.9 F (36.6 C)   TempSrc: Oral   SpO2: 98%   Weight: 109 lb 11.2 oz (49.8 kg)   Height: 5' (1.524 m)     Body mass index is 21.42 kg/m.  Physical Exam Vitals and nursing note reviewed.  Constitutional:      General: She is not in acute distress.    Appearance: She is well-developed and well-groomed. She is not ill-appearing, toxic-appearing or diaphoretic.     Comments: Elderly, appears stated age, NAD, looks a little pale but otherwise well, very anxious, upset and tearful   HENT:     Head: Normocephalic and atraumatic.     Right Ear: Decreased hearing noted.     Left Ear: Decreased hearing noted.     Ears:     Comments: Extremely HOH - only hears and answers when daughter talks to her    Nose: Nose normal.  Eyes:     General:        Right eye: No discharge.        Left eye: No discharge.     Conjunctiva/sclera: Conjunctivae normal.  Neck:     Trachea: No tracheal deviation.  Cardiovascular:     Rate and Rhythm: Normal rate and regular rhythm.  Pulmonary:     Effort: Pulmonary effort is normal. No respiratory distress.     Breath sounds: No stridor.  Musculoskeletal:        General: Normal range of motion.  Skin:     General: Skin is warm and dry.     Capillary Refill: Capillary refill takes less than 2 seconds.     Coloration: Skin is pale. Skin is not jaundiced.     Findings: No bruising, lesion or rash.  Neurological:     Mental Status: She is alert.     Motor: No tremor or abnormal muscle tone.     Coordination: Coordination normal.     Gait: Gait normal.  Psychiatric:        Mood and Affect: Mood is anxious. Affect is tearful.         Assessment & Plan:   Problem List Items Addressed This Visit       Cardiovascular and Mediastinum   Essential hypertension - Primary (Chronic)    BP always elevated in office, pt is near full panic attack everytime brought into office and even with checking BP at home.  difficult to say if BP is at goal or HTN well controlled or not.  Pt is extremely anxious and upset today - likely sig elevating her BP reading, no improvement when rechecked. She is asx, so doubt any end organ damage, prior labs showed normal renal function Will keep meds the same.      Relevant Orders   COMPLETE METABOLIC PANEL WITH GFR   Aortic atherosclerosis ([place])    On statin, good compliance        Other   Anxiety disorder    Severe and significant anxiety with coming to the doctors office, leaving her home or going in public around other people, and with even checking BP at home. Past meds were not helpful and  had bothersome SE She states today they made her feel bad and she burst into tears Her Daughter Tamara Sparks is here states when at home she is pleasant and overall has good mood and no behavioral disturbances.       Dyslipidemia    Compliant with meds - crestor 10 mg, no SE, no myalgias, fatigue or jaundice Labs last checked March 2023, well controlled, will recheck annually No changes, stable, well controlled      Relevant Orders   COMPLETE METABOLIC PANEL WITH GFR   MDD (major depressive disorder), single episode, mild (HCC)    Not on meds, phq 9 reviewed and neg  today      Other Visit Diagnoses     Hyperglycemia       Relevant Orders   COMPLETE METABOLIC PANEL WITH GFR   Need for influenza vaccination       Relevant Orders   Flu Vaccine QUAD High Dose(Fluad) (Completed)   Hypercalcemia       Relevant Orders   COMPLETE METABOLIC PANEL WITH GFR   VITAMIN D 25 Hydroxy (Vit-D Deficiency, Fractures)   Encounter for medication monitoring       Relevant Orders   COMPLETE METABOLIC PANEL WITH GFR   VITAMIN D 25 Hydroxy (Vit-D Deficiency, Fractures)   Discussion about advance care planning held with family member       discussed extensively with pt and daughter Tamara Sparks, ACP packet, advanced directives, and encouraged future planning with family and offered CCM SW          Return in about 6 months (around 09/13/2022) for Routine follow-up telephone call .   Danelle Berry, PA-C 03/14/22 9:27 AM

## 2022-03-14 NOTE — Assessment & Plan Note (Signed)
Compliant with meds - crestor 10 mg, no SE, no myalgias, fatigue or jaundice Labs last checked March 2023, well controlled, will recheck annually No changes, stable, well controlled

## 2022-03-14 NOTE — Assessment & Plan Note (Signed)
BP always elevated in office, pt is near full panic attack everytime brought into office and even with checking BP at home.  difficult to say if BP is at goal or HTN well controlled or not.  Pt is extremely anxious and upset today - likely sig elevating her BP reading, no improvement when rechecked. She is asx, so doubt any end organ damage, prior labs showed normal renal function Will keep meds the same.

## 2022-03-14 NOTE — Assessment & Plan Note (Signed)
Not on meds, phq 9 reviewed and neg today

## 2022-03-14 NOTE — Patient Instructions (Signed)
Vit D supplement 1000-2000 IU daily to help support bone health  Avoid potentials for falls with hand rails, good lighting, no rugs/cords in walkways etc.

## 2022-03-14 NOTE — Assessment & Plan Note (Signed)
Severe and significant anxiety with coming to the doctors office, leaving her home or going in public around other people, and with even checking BP at home. Past meds were not helpful and had bothersome SE She states today they made her feel bad and she burst into tears Her Daughter [name] is here states when at home she is pleasant and overall has good mood and no behavioral disturbances.

## 2022-03-15 LAB — COMPLETE METABOLIC PANEL WITH GFR
AG Ratio: 1.5 (calc) (ref 1.0–2.5)
ALT: 18 U/L (ref 6–29)
AST: 24 U/L (ref 10–35)
Albumin: 4.4 g/dL (ref 3.6–5.1)
Alkaline phosphatase (APISO): 87 U/L (ref 37–153)
BUN: 11 mg/dL (ref 7–25)
CO2: 26 mmol/L (ref 20–32)
Calcium: 10 mg/dL (ref 8.6–10.4)
Chloride: 105 mmol/L (ref 98–110)
Creat: 0.82 mg/dL (ref 0.60–0.95)
Globulin: 3 g/dL (calc) (ref 1.9–3.7)
Glucose, Bld: 102 mg/dL — ABNORMAL HIGH (ref 65–99)
Potassium: 4.1 mmol/L (ref 3.5–5.3)
Sodium: 141 mmol/L (ref 135–146)
Total Bilirubin: 0.8 mg/dL (ref 0.2–1.2)
Total Protein: 7.4 g/dL (ref 6.1–8.1)
eGFR: 67 mL/min/{1.73_m2} (ref >=60)

## 2022-03-15 LAB — VITAMIN D 25 HYDROXY (VIT D DEFICIENCY, FRACTURES): Vit D, 25-Hydroxy: 43 ng/mL (ref 30–100)

## 2022-04-08 ENCOUNTER — Other Ambulatory Visit: Payer: Self-pay | Admitting: Family Medicine

## 2022-04-08 DIAGNOSIS — I10 Essential (primary) hypertension: Secondary | ICD-10-CM

## 2022-08-15 ENCOUNTER — Telehealth: Payer: Self-pay | Admitting: Family Medicine

## 2022-08-15 NOTE — Telephone Encounter (Signed)
Copied from [place] [id number]. Topic: General - Inquiry >> Aug 15, 2022 10:12 AM Tamara Sparks wrote: Reason for CRM: Pt stated she was returning a missed call. No, notes while checking pt mentioned the medical center has someone that goes and checks on her to her home and that she will get in touch with them later and disconnected call.  Please advise.

## 2022-08-15 NOTE — Telephone Encounter (Signed)
I have not called patient.  

## 2022-09-07 ENCOUNTER — Other Ambulatory Visit: Payer: Self-pay | Admitting: Family Medicine

## 2022-09-07 DIAGNOSIS — E785 Hyperlipidemia, unspecified: Secondary | ICD-10-CM

## 2022-09-14 ENCOUNTER — Telehealth (INDEPENDENT_AMBULATORY_CARE_PROVIDER_SITE_OTHER): Payer: Medicare Other | Admitting: Family Medicine

## 2022-09-14 ENCOUNTER — Encounter: Payer: Self-pay | Admitting: Family Medicine

## 2022-09-14 VITALS — Wt 109.0 lb

## 2022-09-14 DIAGNOSIS — F320 Major depressive disorder, single episode, mild: Secondary | ICD-10-CM

## 2022-09-14 DIAGNOSIS — F419 Anxiety disorder, unspecified: Secondary | ICD-10-CM

## 2022-09-14 DIAGNOSIS — I10 Essential (primary) hypertension: Secondary | ICD-10-CM | POA: Diagnosis not present

## 2022-09-14 DIAGNOSIS — E785 Hyperlipidemia, unspecified: Secondary | ICD-10-CM | POA: Diagnosis not present

## 2022-09-14 MED ORDER — AMLODIPINE BESYLATE 5 MG PO TABS: 5.0000 mg | ORAL_TABLET | Freq: Every day | ORAL | 1 refills | Status: DC

## 2022-09-14 MED ORDER — METOPROLOL SUCCINATE ER 50 MG PO TB24
ORAL_TABLET | ORAL | 1 refills | Status: DC
Start: 2022-09-14 — End: 2023-04-09

## 2022-09-14 MED ORDER — ROSUVASTATIN CALCIUM 10 MG PO TABS: 10.0000 mg | ORAL_TABLET | Freq: Every day | ORAL | 1 refills | Status: DC

## 2022-09-14 NOTE — Progress Notes (Signed)
Name: Tamara Sparks   MRN: [id number]    DOB: Feb 19, 1929   Date:09/14/2022       Progress Note  Subjective:    Chief Complaint  Chief Complaint  Patient presents with   Follow-up    I connected with  [name] on 09/14/22 at  9:00 AM EDT by telephone and verified that I am speaking with the correct person using two identifiers.  I discussed the limitations, risks, security and privacy concerns of performing an evaluation and management service by telephone and the availability of in person appointments. Staff also discussed with the patient that there may be a patient responsible charge related to this service. Patient Location: home Provider Location: [facility] clinic Additional Individuals present: none  HPI F/up with pt (usually HOH and very anxious in office, typically presents with her daughter [name]  HTN managed on norvasc 5 mg and metoprolol   HLD on crestor 10 daily   Pt reports no SX or SE with her meds, not checking BP or HR at home  Sleep good- no concerns She reports a little depression when alone by herself for too long, she would like to get out more, she would like to get her hair cut but now it makes her nervous, she instead is having people come to her.     Patient Active Problem List   Diagnosis Date Noted   MDD (major depressive disorder), single episode, mild 10/06/2019   Aortic atherosclerosis 01/20/2018   Dyslipidemia 09/23/2015   Anxiety disorder 12/08/2014   Essential hypertension 01/23/2014    Current Outpatient Medications:    amLODipine (NORVASC) 5 MG tablet, TAKE 1 TABLET (5 MG TOTAL) BY MOUTH DAILY., Disp: 90 tablet, Rfl: 1   metoprolol succinate (TOPROL-XL) 50 MG 24 hr tablet, TAKE 1 TABLET BY MOUTH EVERY DAY WITH OR IMMEDIATELY FOLLOWING A MEAL, Disp: 90 tablet, Rfl: 1   rosuvastatin (CRESTOR) 10 MG tablet, TAKE 1 TABLET BY MOUTH EVERY DAY, Disp: 90 tablet, Rfl: 0   Cholecalciferol (VITAMIN D3) 5000 UNITS CAPS, Take 1 capsule by  mouth daily.  (Patient not taking: Reported on 03/14/2022), Disp: , Rfl:  Allergies  Allergen Reactions   Sertraline    Penicillins Rash    Has patient had a PCN reaction causing immediate rash, facial/tongue/throat swelling, SOB or lightheadedness with hypotension: Yes Has patient had a PCN reaction causing severe rash involving mucus membranes or skin necrosis: No Has patient had a PCN reaction that required hospitalization: No Has patient had a PCN reaction occurring within the last 10 years: No If all of the above answers are "NO", then may proceed with Cephalosporin use.   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17   Family History  Problem Relation Age of Onset   Stroke Mother    Hypertension Father    Stroke Father    Hypertension Sister  Hypertension Brother    Cancer Brother        brain   Hypertension Sister    Hypertension Brother    Cancer Brother        stomach   Social History   Socioeconomic History   Marital status: Widowed    Spouse name: [name]   Number of children: 4   Years of education: Not on file   Highest education level: 9th grade  Occupational History   Occupation: Retired  Tobacco Use   Smoking status: Never   Smokeless tobacco: Never   Tobacco comments:    smoking cessation materials not required  Vaping Use   Vaping Use: Never used  Substance and Sexual Activity   Alcohol use: No    Alcohol/week:  0.0 standard drinks of alcohol   Drug use: No   Sexual activity: Not Currently  Other Topics Concern   Not on file  Social History Narrative   Not on file   Social Determinants of Health   Financial Resource Strain: Low Risk  (10/23/2019)   Overall Financial Resource Strain (CARDIA)    Difficulty of Paying Living Expenses: Not hard at all  Food Insecurity: No Food Insecurity (03/02/2021)   Hunger Vital Sign    Worried About Running Out of Food in the Last Year: Never true    Ran Out of Food in the Last Year: Never true  Transportation Needs: No Transportation Needs (03/02/2021)   PRAPARE - Administrator, Civil Service (Medical): No    Lack of Transportation (Non-Medical): No  Physical Activity: Inactive (10/23/2019)   Exercise Vital Sign    Days of Exercise per Week: 0 days    Minutes of Exercise per Session: 0 min  Stress: Stress Concern Present (10/23/2019)   [employer] of Occupational Health - Occupational Stress Questionnaire    Feeling of Stress : Rather much  Social Connections: Unknown (10/23/2019)   Social Connection and Isolation Panel [NHANES]    Frequency of Communication with Friends and Family: Patient declined    Frequency of Social Gatherings with Friends and Family: Patient declined    Attends Religious Services: Patient declined    Database administrator or Organizations: Patient declined    Attends Banker Meetings: Patient declined    Marital Status: Widowed  Intimate Partner Violence: Not At Risk (10/23/2019)   Humiliation, Afraid, Rape, and Kick questionnaire    Fear of Current or Ex-Partner: No    Emotionally Abused: No    Physically Abused: No    Sexually Abused: No    Chart Review Today: I personally reviewed active problem list, medication list, allergies, family history, social history, health maintenance, notes from last encounter, lab results, imaging with the patient/caregiver today.  Review of Systems  Constitutional:  Negative.   HENT: Negative.    Eyes: Negative.   Respiratory: Negative.    Cardiovascular: Negative.   Gastrointestinal: Negative.   Endocrine: Negative.   Genitourinary: Negative.   Musculoskeletal: Negative.   Skin: Negative.   Allergic/Immunologic: Negative.   Neurological: Negative.   Hematological: Negative.   Psychiatric/Behavioral: Negative.    All other systems reviewed and are negative.    Objective:    Virtual encounter, vitals limited, only able to obtain the following: Today's Vitals   09/14/22 0849  Weight: 109 lb (49.4 kg)  PainSc: 0-No pain   Body mass index is 21.29 kg/m. Nursing Note and Vital Signs reviewed.  Physical Exam  PE limited by telephone  encounter   PHQ2/9:    09/14/2022    8:52 AM 03/14/2022    8:59 AM 08/19/2021    9:21 AM 08/10/2020   10:25 AM 02/11/2020   10:40 AM  Depression screen PHQ 2/9  Decreased Interest 1 1 2  0 0  Down, Depressed, Hopeless 1 1 2  0 0  PHQ - 2 Score 2 2 4  0 0  Altered sleeping 0 1 0 0   Tired, decreased energy 1 1 0 0   Change in appetite 0 0 0 0   Feeling bad or failure about yourself  0 0 0 0   Trouble concentrating 0 0 0 0   Moving slowly or fidgety/restless 0 0 0 0   Suicidal thoughts 0 0 0 0   PHQ-9 Score 3 4 4  0   Difficult doing work/chores Not difficult at all Somewhat difficult Somewhat difficult Not difficult at all    PHQ-2/9 Result is neg, reviewed today   Fall Risk:    09/14/2022    8:51 AM 03/14/2022    8:59 AM 08/19/2021    9:21 AM 03/02/2021    1:16 PM 08/10/2020   10:25 AM  Fall Risk   Falls in the past year? 0 0 0 0 0  Number falls in past yr:  0 0 0 0  Injury with Fall?  0 0  0  Risk for fall due to : Impaired balance/gait No Fall Risks No Fall Risks    Follow up Falls prevention discussed Falls prevention discussed;Education provided Falls prevention discussed       Assessment and Plan:   1. Essential hypertension BP always high in office, BP cuff monitoring at home has  also previously causes severe anxiety and panic Difficult to determine overall control but she currently feels well, no concerning med SE or day to day sx Pt denies CP, SOB, exertional sx, LE edema, palpitation, Ha's, visual disturbances, lightheadedness, hypotension, syncope. Dietary efforts for BP?  Healthy diet Continue norvasc 5 mg and metoprolol XL 50 once dialy  2. Dyslipidemia Doing well on crestor w/o any med SE or concerns Lab Results  Component Value Date   CHOL 159 08/19/2021   HDL 64 08/19/2021   LDLCALC 75 08/19/2021   TRIG 115 08/19/2021   CHOLHDL 2.5 08/19/2021  Lipids were well controlled   3. Anxiety disorder, unspecified type Still not well controlled, but better than prior years Wishing not to take any meds  4. MDD (major depressive disorder), single episode, mild    09/14/2022    8:52 AM 03/14/2022    8:59 AM 08/19/2021    9:21 AM  Depression screen PHQ 2/9  Decreased Interest 1 1 2   Down, Depressed, Hopeless 1 1 2   PHQ - 2 Score 2 2 4   Altered sleeping 0 1 0  Tired, decreased energy 1 1 0  Change in appetite 0 0 0  Feeling bad or failure about yourself  0 0 0  Trouble concentrating 0 0 0  Moving slowly or fidgety/restless 0 0 0  Suicidal thoughts 0 0 0  PHQ-9 Score 3 4 4   Difficult doing work/chores Not difficult at all Somewhat difficult Somewhat difficult  PHQ 9 reviewed and neg Pt notes mostly getting down when she has been alone for too long, discussed scheduling visits and outings to keep herself from too much isolation   Meds refilled:   ICD-10-CM   1. Essential hypertension  I10 amLODipine (NORVASC) 5 MG tablet    metoprolol  succinate (TOPROL-XL) 50 MG 24 hr tablet    2. Dyslipidemia  E78.5 rosuvastatin (CRESTOR) 10 MG tablet    3. Anxiety disorder, unspecified type  F41.9     4. MDD (major depressive disorder), single episode, mild  F32.0      Return in about 6 months (around 03/16/2023) for Routine follow-up.   I discussed the  assessment and treatment plan with the patient. The patient was provided an opportunity to ask questions and all were answered. The patient agreed with the plan and demonstrated an understanding of the instructions.   The patient was advised to call back or seek an in-person evaluation if the symptoms worsen or if the condition fails to improve as anticipated.  I provided 18 minutes of non-face-to-face time during this encounter.  Danelle Berry, PA-C 09/14/22 9:46 AM

## 2023-04-07 ENCOUNTER — Other Ambulatory Visit: Payer: Self-pay | Admitting: Family Medicine

## 2023-04-07 DIAGNOSIS — I10 Essential (primary) hypertension: Secondary | ICD-10-CM

## 2023-04-09 NOTE — Telephone Encounter (Signed)
Called pt and discussed making appt. Pt stated last time she was in a car she became dizzy. Pt stated she would like to try a ride with her daughter to see if same sx. Pt mentioned having a home care nurse who monitors her BP. Pt mentioned wanting a Flu shot. Pt stated she will call back to make appt.   Requested Prescriptions  Pending Prescriptions Disp Refills   amLODipine (NORVASC) 5 MG tablet [Pharmacy Med Name: AMLODIPINE BESYLATE 5 MG TAB] 90 tablet 1    Sig: TAKE 1 TABLET (5 MG TOTAL) BY MOUTH DAILY.     Cardiovascular: Calcium Channel Blockers 2 Failed - 04/07/2023  1:10 AM      Failed - Last BP in normal range    BP Readings from Last 1 Encounters:  03/14/22 (!) 184/70         Failed - Valid encounter within last 6 months    Recent Outpatient Visits           6 months ago Essential hypertension   [place] [facility] Danelle Berry, PA-C   1 year ago Essential hypertension   [place] [facility] Danelle Berry, PA-C   1 year ago Essential hypertension   [place] [facility] Danelle Berry, PA-C   2 years ago Essential hypertension   [place] [facility] Danelle Berry, PA-C   3 years ago Essential hypertension   [place] [facility] Danelle Berry, [place]              Passed - Last Heart Rate in normal range    Pulse Readings from Last 1 Encounters:  03/14/22 77          metoprolol succinate (TOPROL-XL) 50 MG 24 hr tablet [Pharmacy Med Name: METOPROLOL SUCC ER 50 MG TAB] 90 tablet 1    Sig: TAKE 1 TABLET BY MOUTH EVERY DAY WITH OR IMMEDIATELY FOLLOWING A MEAL     Cardiovascular:  Beta Blockers Failed - 04/07/2023  1:10 AM      Failed - Last BP in normal range    BP Readings from Last 1 Encounters:  03/14/22 (!) 184/70         Failed - Valid encounter within last 6 months    Recent Outpatient Visits           6 months ago Essential hypertension   [place] [facility] Danelle Berry, PA-C   1 year ago Essential hypertension   [place] [facility] Danelle Berry, PA-C   1 year ago Essential hypertension   [place] Danelle Berry, PA-C   2 years ago Essential hypertension   [place] [facility] Danelle Berry, PA-C   3 years ago Essential hypertension   [facility], [place], [place]              Passed - Last Heart Rate in normal range    Pulse Readings from Last 1 Encounters:  03/14/22 77

## 2023-05-02 ENCOUNTER — Other Ambulatory Visit: Payer: Self-pay | Admitting: Family Medicine

## 2023-05-02 DIAGNOSIS — I10 Essential (primary) hypertension: Secondary | ICD-10-CM

## 2023-05-06 ENCOUNTER — Other Ambulatory Visit: Payer: Self-pay | Admitting: Family Medicine

## 2023-05-06 DIAGNOSIS — I10 Essential (primary) hypertension: Secondary | ICD-10-CM

## 2023-05-18 ENCOUNTER — Telehealth (INDEPENDENT_AMBULATORY_CARE_PROVIDER_SITE_OTHER): Payer: Medicare Other | Admitting: Internal Medicine

## 2023-05-18 ENCOUNTER — Encounter: Payer: Self-pay | Admitting: Internal Medicine

## 2023-05-18 DIAGNOSIS — F419 Anxiety disorder, unspecified: Secondary | ICD-10-CM

## 2023-05-18 DIAGNOSIS — E785 Hyperlipidemia, unspecified: Secondary | ICD-10-CM

## 2023-05-18 DIAGNOSIS — I10 Essential (primary) hypertension: Secondary | ICD-10-CM | POA: Diagnosis not present

## 2023-05-18 MED ORDER — ROSUVASTATIN CALCIUM 10 MG PO TABS: 10.0000 mg | ORAL_TABLET | Freq: Every day | ORAL | 1 refills | Status: DC

## 2023-05-18 MED ORDER — AMLODIPINE BESYLATE 5 MG PO TABS: 5.0000 mg | ORAL_TABLET | Freq: Every day | ORAL | 1 refills | Status: DC

## 2023-05-18 MED ORDER — METOPROLOL SUCCINATE ER 50 MG PO TB24
ORAL_TABLET | ORAL | 1 refills | Status: DC
Start: 2023-05-18 — End: 2023-11-12

## 2023-05-18 NOTE — Progress Notes (Signed)
Virtual Visit via Video Note  I connected with Tamara Sparks on 05/18/23 at  1:40 PM EST by a video enabled telemedicine application and verified that I am speaking with the correct person using two identifiers.  Location: Patient: Home Provider: [facility]   I discussed the limitations of evaluation and management by telemedicine and the availability of in person appointments. The patient expressed understanding and agreed to proceed.  History of Present Illness:  Patient is presenting via telemedicine for follow up on chronic medical conditions and medication refill. She is new to me. She is HOH and her daughter [name] is present to help with communication.   Hypertension: -Medications: Amlodipine 5 mg, Metoprolol XL 50 mg -Patient is compliant with above medications and reports no side effects. -Checking BP at home (average): not checking at home but has a home health nurse who will come out to the house and check occasionally  -Denies any SOB, CP, vision changes, LE edema or symptoms of hypotension  HLD/Aortic Arthrosclerosis: -Medications: Crestor 5 mg -Patient is compliant with above medications and reports no side effects.  -Last lipid panel: Lipid Panel     Component Value Date/Time   CHOL 159 08/19/2021 1000   CHOL 145 09/23/2015 0946   TRIG 115 08/19/2021 1000   HDL 64 08/19/2021 1000   HDL 54 09/23/2015 0946   CHOLHDL 2.5 08/19/2021 1000   VLDL 9 07/07/2018 0445   LDLCALC 75 08/19/2021 1000   LABVLDL 23 09/23/2015 0946   Health Maintenance: -Blood work due   Observations/Objective:  General: well appearing, no acute distress ENT: conjunctiva normal appearing bilaterally, HOH Skin: no rashes, cyanosis or abnormal bruising noted  Assessment and Plan:  1. Essential hypertension (Primary): Patient has not been seen in the clinic in over a year, home health nurse monitors blood pressure but does not recall specific numbers. Doing ok with medications but no labs since  October 2023. Patient's daughter states that the patient has severe anxiety which will cause her to get dizzy and vomit and because of this she has been homebound for nearly a year. Will place a referral to home health so she can have her labs drawn at home and will refill medications. Plan to follow up in 6 months or sooner as needed.   - Ambulatory referral to Home Health - amLODipine (NORVASC) 5 MG tablet; Take 1 tablet (5 mg total) by mouth daily.  Dispense: 90 tablet; Refill: 1 - metoprolol succinate (TOPROL-XL) 50 MG 24 hr tablet; TAKE 1 TABLET BY MOUTH EVERY DAY WITH OR IMMEDIATELY FOLLOWING A MEAL  Dispense: 90 tablet; Refill: 1  2. Dyslipidemia: Home health to come out and check lipid panel, refill Crestor 5 mg.   - Ambulatory referral to Home Health - rosuvastatin (CRESTOR) 10 MG tablet; Take 1 tablet (10 mg total) by mouth daily.  Dispense: 90 tablet; Refill: 1  3. Anxiety disorder, unspecified type: Home health order placed for labs.   - Ambulatory referral to Home Health   Follow Up Instructions: 6 months     I discussed the assessment and treatment plan with the patient. The patient was provided an opportunity to ask questions and all were answered. The patient agreed with the plan and demonstrated an understanding of the instructions.   The patient was advised to call back or seek an in-person evaluation if the symptoms worsen or if the condition fails to improve as anticipated.  I provided 14 minutes of non-face-to-face time during this encounter.  Margarita Mail, DO

## 2023-05-21 ENCOUNTER — Telehealth: Payer: Self-pay | Admitting: Family Medicine

## 2023-05-21 NOTE — Telephone Encounter (Signed)
Amp from [facility] is at patients house to do therapy and she stated that she would not let her in. She wanted to report it.

## 2023-11-09 ENCOUNTER — Other Ambulatory Visit: Payer: Self-pay | Admitting: Internal Medicine

## 2023-11-09 DIAGNOSIS — E785 Hyperlipidemia, unspecified: Secondary | ICD-10-CM

## 2023-11-09 DIAGNOSIS — I10 Essential (primary) hypertension: Secondary | ICD-10-CM

## 2023-11-12 NOTE — Telephone Encounter (Signed)
 Requested Prescriptions  Pending Prescriptions Disp Refills   amLODipine  (NORVASC ) 5 MG tablet [Pharmacy Med Name: AMLODIPINE  BESYLATE 5 MG TAB] 90 tablet 0    Sig: TAKE 1 TABLET (5 MG TOTAL) BY MOUTH DAILY.     Cardiovascular: Calcium  Channel Blockers 2 Failed - 11/12/2023  3:32 PM      Failed - Last BP in normal range    BP Readings from Last 1 Encounters:  03/14/22 (!) 184/70         Failed - Valid encounter within last 6 months    Recent Outpatient Visits   None            Passed - Last Heart Rate in normal range    Pulse Readings from Last 1 Encounters:  03/14/22 77          rosuvastatin  (CRESTOR ) 10 MG tablet [Pharmacy Med Name: ROSUVASTATIN  CALCIUM  10 MG TAB] 90 tablet 0    Sig: TAKE 1 TABLET BY MOUTH EVERY DAY     Cardiovascular:  Antilipid - Statins 2 Failed - 11/12/2023  3:32 PM      Failed - Cr in normal range and within 360 days    Creat  Date Value Ref Range Status  03/14/2022 0.82 0.60 - 0.95 mg/dL Final         Failed - Valid encounter within last 12 months    Recent Outpatient Visits   None            Failed - Lipid Panel in normal range within the last 12 months    Cholesterol, Total  Date Value Ref Range Status  09/23/2015 145 100 - 199 mg/dL Final   Cholesterol  Date Value Ref Range Status  08/19/2021 159 <200 mg/dL Final   LDL Cholesterol (Calc)  Date Value Ref Range Status  08/19/2021 75 mg/dL (calc) Final    Comment:    Reference range: <100 . Desirable range <100 mg/dL for primary prevention;   <70 mg/dL for patients with CHD or diabetic patients  with > or = 2 CHD risk factors. SABRA LDL-C is now calculated using the Martin-Hopkins  calculation, which is a validated novel method providing  better accuracy than the Friedewald equation in the  estimation of LDL-C.  Gladis APPLETHWAITE et al. SANDREA. 7986;689(80): 2061-2068  (http://education.[employer].com/faq/FAQ164)    HDL  Date Value Ref Range Status  08/19/2021 64 > OR = 50 mg/dL  Final  94/95/7982 54 >60 mg/dL Final   Triglycerides  Date Value Ref Range Status  08/19/2021 115 <150 mg/dL Final         Passed - Patient is not pregnant       metoprolol  succinate (TOPROL -XL) 50 MG 24 hr tablet [Pharmacy Med Name: METOPROLOL  SUCC ER 50 MG TAB] 90 tablet 0    Sig: TAKE 1 TABLET BY MOUTH EVERY DAY WITH OR IMMEDIATELY FOLLOWING A MEAL     Cardiovascular:  Beta Blockers Failed - 11/12/2023  3:32 PM      Failed - Last BP in normal range    BP Readings from Last 1 Encounters:  03/14/22 (!) 184/70         Failed - Valid encounter within last 6 months    Recent Outpatient Visits   None            Passed - Last Heart Rate in normal range    Pulse Readings from Last 1 Encounters:  03/14/22 77

## 2023-11-28 ENCOUNTER — Encounter: Payer: Self-pay | Admitting: Family Medicine

## 2023-11-28 ENCOUNTER — Ambulatory Visit (INDEPENDENT_AMBULATORY_CARE_PROVIDER_SITE_OTHER): Admitting: Family Medicine

## 2023-11-28 VITALS — BP 156/70 | HR 82 | Resp 16 | Ht 60.0 in | Wt 101.0 lb

## 2023-11-28 DIAGNOSIS — I10 Essential (primary) hypertension: Secondary | ICD-10-CM | POA: Diagnosis not present

## 2023-11-28 DIAGNOSIS — Z741 Need for assistance with personal care: Secondary | ICD-10-CM | POA: Diagnosis not present

## 2023-11-28 DIAGNOSIS — Z5181 Encounter for therapeutic drug level monitoring: Secondary | ICD-10-CM

## 2023-11-28 DIAGNOSIS — R739 Hyperglycemia, unspecified: Secondary | ICD-10-CM

## 2023-11-28 DIAGNOSIS — H9190 Unspecified hearing loss, unspecified ear: Secondary | ICD-10-CM | POA: Diagnosis not present

## 2023-11-28 DIAGNOSIS — F320 Major depressive disorder, single episode, mild: Secondary | ICD-10-CM | POA: Diagnosis not present

## 2023-11-28 DIAGNOSIS — Z710 Person encountering health services to consult on behalf of another person: Secondary | ICD-10-CM

## 2023-11-28 DIAGNOSIS — Z748 Other problems related to care provider dependency: Secondary | ICD-10-CM | POA: Insufficient documentation

## 2023-11-28 DIAGNOSIS — Z0001 Encounter for general adult medical examination with abnormal findings: Secondary | ICD-10-CM | POA: Diagnosis not present

## 2023-11-28 DIAGNOSIS — E785 Hyperlipidemia, unspecified: Secondary | ICD-10-CM | POA: Diagnosis not present

## 2023-11-28 DIAGNOSIS — Z0000 Encounter for general adult medical examination without abnormal findings: Secondary | ICD-10-CM

## 2023-11-28 DIAGNOSIS — I700 Atherosclerosis of aorta: Secondary | ICD-10-CM | POA: Diagnosis not present

## 2023-11-28 DIAGNOSIS — R634 Abnormal weight loss: Secondary | ICD-10-CM

## 2023-11-28 DIAGNOSIS — F419 Anxiety disorder, unspecified: Secondary | ICD-10-CM

## 2023-11-28 DIAGNOSIS — R32 Unspecified urinary incontinence: Secondary | ICD-10-CM

## 2023-11-28 LAB — LIPID PANEL
Cholesterol: 176 mg/dL (ref <200)
HDL: 71 mg/dL (ref >=50)
LDL Cholesterol (Calc): 85 mg/dL
Non-HDL Cholesterol (Calc): 105 mg/dL (ref <130)
Total CHOL/HDL Ratio: 2.5 (calc) (ref <5.0)
Triglycerides: 108 mg/dL (ref <150)

## 2023-11-28 LAB — COMPREHENSIVE METABOLIC PANEL WITH GFR
AG Ratio: 1.4 (calc) (ref 1.0–2.5)
ALT: 11 U/L (ref 6–29)
AST: 19 U/L (ref 10–35)
Albumin: 4.5 g/dL (ref 3.6–5.1)
Alkaline phosphatase (APISO): 82 U/L (ref 37–153)
BUN: 10 mg/dL (ref 7–25)
CO2: 29 mmol/L (ref 20–32)
Calcium: 10.6 mg/dL — ABNORMAL HIGH (ref 8.6–10.4)
Chloride: 102 mmol/L (ref 98–110)
Creat: 0.78 mg/dL (ref 0.60–0.95)
Globulin: 3.2 g/dL (ref 1.9–3.7)
Glucose, Bld: 102 mg/dL — ABNORMAL HIGH (ref 65–99)
Potassium: 3.8 mmol/L (ref 3.5–5.3)
Sodium: 141 mmol/L (ref 135–146)
Total Bilirubin: 0.9 mg/dL (ref 0.2–1.2)
Total Protein: 7.7 g/dL (ref 6.1–8.1)
eGFR: 71 mL/min/1.73m2 (ref >=60)

## 2023-11-28 LAB — CBC WITH DIFFERENTIAL/PLATELET
Absolute Lymphocytes: 2099 {cells}/uL (ref 850–3900)
Absolute Monocytes: 713 {cells}/uL (ref 200–950)
Basophils Absolute: 90 {cells}/uL (ref 0–200)
Basophils Relative: 1.1 %
Eosinophils Absolute: 697 {cells}/uL — ABNORMAL HIGH (ref 15–500)
Eosinophils Relative: 8.5 %
HCT: 44.2 % (ref 35.0–45.0)
Hemoglobin: 14.6 g/dL (ref 11.7–15.5)
MCH: 29.6 pg (ref 27.0–33.0)
MCHC: 33 g/dL (ref 32.0–36.0)
MCV: 89.5 fL (ref 80.0–100.0)
MPV: 9.5 fL (ref 7.5–12.5)
Monocytes Relative: 8.7 %
Neutro Abs: 4600 {cells}/uL (ref 1500–7800)
Neutrophils Relative %: 56.1 %
Platelets: 334 Thousand/uL (ref 140–400)
RBC: 4.94 Million/uL (ref 3.80–5.10)
RDW: 12.7 % (ref 11.0–15.0)
Total Lymphocyte: 25.6 %
WBC: 8.2 Thousand/uL (ref 3.8–10.8)

## 2023-11-28 LAB — HEMOGLOBIN A1C
Hgb A1c MFr Bld: 5.5 % (ref <5.7)
Mean Plasma Glucose: 111 mg/dL
eAG (mmol/L): 6.2 mmol/L

## 2023-11-28 MED ORDER — ESCITALOPRAM OXALATE 5 MG PO TABS: 5.0000 mg | ORAL_TABLET | Freq: Every day | ORAL | 3 refills | Status: DC

## 2023-11-28 NOTE — Assessment & Plan Note (Signed)
 Consider discussing with insurance care aid - we will complete order and form for assessment Will see if [facility] is an option? Goals of Care Lives alone, prefers to remain at home. Family involved in care, prefers to avoid assisted living. Advanced directives, including code status, need discussion with family. - Discuss palliative care with family to manage chronic conditions at home. - Consider a patient care aide assessment for assistance with activities of daily living. - Encourage family to establish advanced directives, including code status.

## 2023-11-28 NOTE — Assessment & Plan Note (Signed)
 On statin.

## 2023-11-28 NOTE — Assessment & Plan Note (Signed)
 Check A1c today.

## 2023-11-28 NOTE — Progress Notes (Signed)
 Name: Tamara Sparks   MRN: [id number]    DOB: 1928-11-12   Date:11/28/2023       Progress Note  Chief Complaint  Patient presents with   Medical Management of Chronic Issues   Hypertension   Depression     Subjective:   Tamara Sparks is a 89 y.o. female, presents to clinic for routine follow up on chronic conditions and asking for [facility] orders?  Discussed the use of AI scribe software for clinical note transcription with the patient, who gave verbal consent to proceed.  History of Present Illness Tamara Sparks is a 89 year old female who presents for routine follow-up and assistance with home care. She is accompanied by her daughter, Tamara Sparks.  Anxiety and depressive symptoms - Significant anxiety, particularly when leaving her home, described as a traumatic experience - Prefers to stay at home, family visits often to daily - History of anxiety and depression - Previous trials past meds like Zoloft  were not well-tolerated due to sedation, on lexapro  in the past but pt did not taking regularly - Currently not taking any medication for anxiety or depression, but pt does endorse feeling depressed  Hearing impairment - Hard of hearing  Nutritional status and weight loss - Diet consists mainly of corn flakes with milk for breakfast, tea, and sometimes a baked potato or mixed vegetables for dinner - Weight decreased from 110 lbs to 101 lbs over the past few years  Urinary incontinence - Urinary incontinence with small amounts of urinary leakage - Wears protective garments for incontinence - Longstanding issue  Functional status and activities of daily living - Lives alone - Manages medications independently - Performs bathing at the sink, avoids bathtub due to safety concerns  Social support and home care - Daughter and family members check on her regularly - Daughter is primary caregiver - Family has discussed home care assistance but has not pursued assisted  living due to her preference to remain at home  Chronic medical conditions - History of hypertension and hyperlipidemia  HTN high today, which is common for pt with severe anxiety coming to appts, BP managed on amlodipine  and BB  HLD on crestor  10 mg daily and tolerating w/o SE or concerns     Current Outpatient Medications:    amLODipine  (NORVASC ) 5 MG tablet, TAKE 1 TABLET (5 MG TOTAL) BY MOUTH DAILY., Disp: 90 tablet, Rfl: 0   metoprolol  succinate (TOPROL -XL) 50 MG 24 hr tablet, TAKE 1 TABLET BY MOUTH EVERY DAY WITH OR IMMEDIATELY FOLLOWING A MEAL, Disp: 90 tablet, Rfl: 0   rosuvastatin  (CRESTOR ) 10 MG tablet, TAKE 1 TABLET BY MOUTH EVERY DAY, Disp: 90 tablet, Rfl: 0  Patient Active Problem List   Diagnosis Date Noted   MDD (major depressive disorder), single episode, mild (HCC) 10/06/2019   Aortic atherosclerosis (HCC) 01/20/2018   Dyslipidemia 09/23/2015   Anxiety disorder 12/08/2014   Essential hypertension 01/23/2014    Past Surgical History:  Procedure Laterality Date   CARDIAC CATHETERIZATION     [facility]   CATARACT EXTRACTION W/PHACO Right 03/08/2016   Procedure: CATARACT EXTRACTION PHACO AND INTRAOCULAR LENS PLACEMENT (IOC);  Surgeon: Steven Dingeldein, MD;  Location: [facility] ORS;  Service: Ophthalmology;  Laterality: Right;   FLUID CASSETTE [id number] H, EXP 07/19/17 US     3:16.8   AP%   27.2 CDE 94.14   EXTERNAL EAR SURGERY Left    LOBECTOMY Right 1952   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17    Family History  Problem Relation Age of Onset   Stroke Mother    Hypertension Father    Stroke Father    Hypertension Sister    Hypertension Brother    Cancer Brother        brain   Hypertension Sister    Hypertension Brother    Cancer Brother        stomach    Social History   Tobacco Use   Smoking status: Never   Smokeless tobacco: Never   Tobacco comments:    smoking cessation materials not required  Vaping Use   Vaping status: Never Used  Substance Use Topics    Alcohol use: No    Alcohol/week: 0.0 standard drinks of alcohol   Drug use: No     Allergies  Allergen Reactions   Sertraline     Penicillins Rash    Has patient had a PCN reaction causing immediate rash, facial/tongue/throat swelling, SOB or lightheadedness with hypotension: Yes Has patient had a PCN reaction causing severe rash involving mucus membranes or skin necrosis: No Has patient had a PCN reaction that required hospitalization: No Has patient had a PCN reaction occurring within the last 10 years: No If all of the above answers are NO, then may proceed with Cephalosporin use.   Sulfa Antibiotics Other (See Comments)    Pt does not remember but thinks that it just didn't agree with her    Health Maintenance  Topic Date Due   Medicare Annual Wellness (AWV)  10/22/2020   INFLUENZA VACCINE  12/21/2023   Pneumococcal Vaccine: 50+ Years  Completed   Hepatitis B Vaccines  Aged Out   HPV VACCINES  Aged Out   Meningococcal B Vaccine  Aged Out   DTaP/Tdap/Td  Discontinued   DEXA SCAN  Discontinued   COVID-19 Vaccine  Discontinued   Zoster Vaccines- Shingrix  Discontinued    Chart Review Today: I personally reviewed active problem list, medication list, allergies, family history, social history, health maintenance, notes from last encounter, lab results, imaging with the patient/caregiver today.   Review of Systems  Constitutional: Negative.   HENT: Negative.    Eyes: Negative.   Respiratory: Negative.    Cardiovascular: Negative.   Gastrointestinal: Negative.   Endocrine: Negative.   Genitourinary: Negative.   Musculoskeletal: Negative.   Skin: Negative.   Allergic/Immunologic: Negative.   Neurological: Negative.   Hematological: Negative.   Psychiatric/Behavioral: Negative.    All other systems reviewed and are negative.    Objective:   Vitals:   11/28/23 1455  BP: (!) 156/70  Pulse: 82  Resp: 16  SpO2: 98%  Weight: 101 lb (45.8 kg)  Height: 5' (1.524 m)     Body mass index is 19.73 kg/m.  Physical Exam Vitals and nursing note reviewed.  Constitutional:      General: She is not in acute distress.    Appearance: Normal appearance. She is well-developed. She is not ill-appearing, toxic-appearing or diaphoretic.     Comments: Elderly, thin and well appearing, very anxious and upset  HENT:     Head: Normocephalic and atraumatic.     Right Ear: External ear normal. Decreased hearing noted.     Left Ear: External ear normal. Decreased hearing noted.     Ears:     Comments: Extremely HOH    Nose: Nose normal.  Eyes:     General: No scleral icterus.       Right eye: No discharge.        Left eye:  No discharge.     Conjunctiva/sclera: Conjunctivae normal.  Neck:     Trachea: No tracheal deviation.  Cardiovascular:     Rate and Rhythm: Normal rate and regular rhythm.     Pulses: Normal pulses.     Heart sounds: Normal heart sounds. No murmur heard.    No friction rub. No gallop.  Pulmonary:     Effort: Pulmonary effort is normal. No respiratory distress.     Breath sounds: Normal breath sounds. No stridor. No wheezing, rhonchi or rales.  Abdominal:     General: Bowel sounds are normal.     Palpations: Abdomen is soft.  Musculoskeletal:     Right lower leg: No edema.     Left lower leg: No edema.  Skin:    General: Skin is warm and dry.     Capillary Refill: Capillary refill takes less than 2 seconds.     Findings: No rash.  Neurological:     Mental Status: She is alert.     Motor: No abnormal muscle tone.     Coordination: Coordination normal.     Gait: Gait normal.  Psychiatric:        Mood and Affect: Mood is anxious.        Speech: Speech is tangential.        Behavior: Behavior normal.        Cognition and Memory: Memory is impaired.      Functional Status Survey: Is the patient deaf or have difficulty hearing?: Yes Does the patient have difficulty seeing, even when wearing glasses/contacts?: Yes Does the patient  have difficulty concentrating, remembering, or making decisions?: No Does the patient have difficulty walking or climbing stairs?: Yes Does the patient have difficulty dressing or bathing?: No Does the patient have difficulty doing errands alone such as visiting a doctor's office or shopping?: Yes Results for orders placed or performed in visit on 03/14/22  COMPLETE METABOLIC PANEL WITH GFR   Collection Time: 03/14/22  9:44 AM  Result Value Ref Range   Glucose, Bld 102 (H) 65 - 99 mg/dL   BUN 11 7 - 25 mg/dL   Creat 9.17 9.39 - 9.04 mg/dL   eGFR 67 > OR = 60 fO/fpw/8.26f7   BUN/Creatinine Ratio SEE NOTE: 6 - 22 (calc)   Sodium 141 135 - 146 mmol/L   Potassium 4.1 3.5 - 5.3 mmol/L   Chloride 105 98 - 110 mmol/L   CO2 26 20 - 32 mmol/L   Calcium  10.0 8.6 - 10.4 mg/dL   Total Protein 7.4 6.1 - 8.1 g/dL   Albumin 4.4 3.6 - 5.1 g/dL   Globulin 3.0 1.9 - 3.7 g/dL (calc)   AG Ratio 1.5 1.0 - 2.5 (calc)   Total Bilirubin 0.8 0.2 - 1.2 mg/dL   Alkaline phosphatase (APISO) 87 37 - 153 U/L   AST 24 10 - 35 U/L   ALT 18 6 - 29 U/L  VITAMIN D  25 Hydroxy (Vit-D Deficiency, Fractures)   Collection Time: 03/14/22  9:44 AM  Result Value Ref Range   Vit D, 25-Hydroxy 43 30 - 100 ng/mL      Assessment & Plan:   Essential hypertension Assessment & Plan: Blood pressure is elevated. She is on Amlodipine  and Metoprolol  for hypertension management. - Recheck blood pressure during the visit. - Continue Amlodipine  and Metoprolol . She had been on clonidine  in the past for very high BP readings however we have found that checking BP at home and in office are  both very anxiety provoking and we are likely catching BP elevated above her normal For now will continue current meds and doses BP Readings from Last 3 Encounters:  11/28/23 (!) 156/70  03/14/22 (!) 184/70  08/19/21 (!) 156/70   Pulse Readings from Last 3 Encounters:  11/28/23 82  03/14/22 77  08/19/21 88     Orders: -      Comprehensive metabolic panel with GFR  Dyslipidemia Assessment & Plan: She is on Rosuvastatin  for dyslipidemia management. Due for labs, will attempt to keep pt on statin long term as long as tolerating Lab Results  Component Value Date   CHOL 159 08/19/2021   HDL 64 08/19/2021   LDLCALC 75 08/19/2021   TRIG 115 08/19/2021   CHOLHDL 2.5 08/19/2021    - Continue Rosuvastatin .  Orders: -     Comprehensive metabolic panel with GFR -     Lipid panel  Anxiety disorder, unspecified type Assessment & Plan: Severe anxiety, pt has been on meds before but stops meds or has poor compliance Nearly agoraphobic, pt has severe panic/anxiety to leave home or go anywhere Discussed possibly trying lexapro  again or can try prozac  Tamara Sparks experiences significant anxiety, particularly when leaving her home, suggestive of agoraphobia. Previous medications for anxiety were not well-tolerated due to sedation, leading to inconsistent adherence. - Consider a low dose of Lexapro  or Prozac, emphasizing daily use for 4-6 weeks to assess effectiveness. - Explore palliative care to manage chronic problems at home, reducing the need for office visits.  Orders: -     AMB Referral [facility] Care Management -     Escitalopram  Oxalate; Take 1 tablet (5 mg total) by mouth daily. For moods/nerves, take daily for it to work  Dispense: 90 tablet; Refill: 3  MDD (major depressive disorder), single episode, mild (HCC) Assessment & Plan:    11/28/2023    2:58 PM 05/18/2023    8:25 AM 09/14/2022    8:52 AM  Depression screen PHQ 2/9  Decreased Interest 1 1 1   Down, Depressed, Hopeless 1 1 1   PHQ - 2 Score 2 2 2   Altered sleeping 0 0 0  Tired, decreased energy 0 1 1  Change in appetite 0 0 0  Feeling bad or failure about yourself  0 0 0  Trouble concentrating 0 0 0  Moving slowly or fidgety/restless 0 0 0  Suicidal thoughts 0 0 0  PHQ-9 Score 2 3 3   Difficult doing work/chores   Not difficult at all   Tamara Sparks has  depression and has been on Lexapro  and Zoloft  previously. She is reluctant to take medications due to sedation and inconsistent adherence. Discussed restarting Lexapro  or trying Prozac, which may have fewer sedative effects. - Consider restarting Lexapro  or trying Prozac for depression management. - Educate on the importance of consistent medication use.  Orders: -     AMB Referral [facility] Care Management -     Escitalopram  Oxalate; Take 1 tablet (5 mg total) by mouth daily. For moods/nerves, take daily for it to work  Dispense: 90 tablet; Refill: 3  Hyperglycemia Assessment & Plan: Check A1c today  Orders: -     Hemoglobin A1c  Aortic atherosclerosis (HCC) Assessment & Plan: On statin   Orders: -     Comprehensive metabolic panel with GFR -     Lipid panel  Encounter for medication monitoring -     CBC with Differential/Platelet -     Comprehensive metabolic panel with GFR -  Hemoglobin A1c -     Lipid panel  Requires assistance with activities of daily living (ADL) Assessment & Plan: Consider discussing with insurance care aid - we will complete order and form for assessment Will see if [facility] is an option? Goals of Care Lives alone, prefers to remain at home. Family involved in care, prefers to avoid assisted living. Advanced directives, including code status, need discussion with family. - Discuss palliative care with family to manage chronic conditions at home. - Consider a patient care aide assessment for assistance with activities of daily living. - Encourage family to establish advanced directives, including code status.  Orders: -     AMB Referral [facility] Care Management  Hard of hearing Assessment & Plan: Very HOH, chronic, stable  Orders: -     AMB Referral [facility] Care Management  Assistance needed with transportation -     AMB Referral [facility] Care Management  Discussion about advance care planning held with family member -     AMB Referral [facility] Care  Management  Urinary incontinence, unspecified type Assessment & Plan: Onset many years ago, but never noted in chart Experiences urinary incontinence with small amounts of leakage, using protective garments, stable, no concerns at this time per daughter   Weight loss Assessment & Plan: Eats like a bird per daughter, prepares her own food at home alone A little weight loss from her prior baseline which has been 109-110 for years  Wt Readings from Last 5 Encounters:  11/28/23 101 lb (45.8 kg)  09/14/22 109 lb (49.4 kg)  03/14/22 109 lb 11.2 oz (49.8 kg)  08/19/21 110 lb (49.9 kg)  08/10/20 110 lb (49.9 kg)   BMI Readings from Last 5 Encounters:  11/28/23 19.73 kg/m  09/14/22 21.29 kg/m  03/14/22 21.42 kg/m  08/19/21 21.48 kg/m  08/10/20 20.78 kg/m      Assessment and Plan Assessment & Plan   General Health Maintenance No labs in two years. Weight decreased from 110 lbs to 101 lbs. Diet consists of simple meals, managing activities of daily living independently. - Order laboratory tests to assess overall health status.  Goals of Care Lives alone, prefers to remain at home. Family involved in care, prefers to avoid assisted living. Advanced directives, including code status, need discussion with family. - Discuss palliative care with family to manage chronic conditions at home. - Consider a patient care aide assessment for assistance with activities of daily living. - Encourage family to establish advanced directives, including code status.  Follow-up Considering options for home care and chronic condition management. Coordination with a Child psychotherapist is necessary to explore resources for home care assistance. - Call family to discuss resources and follow-up plans. - Coordinate with a social worker to explore home care assistance resources.  Recording duration: 20 minutes   HH ordered entered to see if pt qualifies for any in home assessment, Rn, SW, PT/OT or  aid etc    Return in about 6 months (around 05/30/2024) for Routine follow-up - virtual ok with daughter.   1 year from now we can try to get labs via [facility] or eval and f/up done with palliative care.  Pts daughter does not expect to be able to get her back to the office ever again.  Will do virtual next OV and then try to arrange some assessment or labs in 1 year from now to try and keep her medications being safely prescribed   Michelene Cower, PA-C 11/28/23 3:10 PM

## 2023-11-28 NOTE — Assessment & Plan Note (Signed)
 Blood pressure is elevated. She is on Amlodipine  and Metoprolol  for hypertension management. - Recheck blood pressure during the visit. - Continue Amlodipine  and Metoprolol . She had been on clonidine  in the past for very high BP readings however we have found that checking BP at home and in office are both very anxiety provoking and we are likely catching BP elevated above her normal For now will continue current meds and doses BP Readings from Last 3 Encounters:  11/28/23 (!) 156/70  03/14/22 (!) 184/70  08/19/21 (!) 156/70   Pulse Readings from Last 3 Encounters:  11/28/23 82  03/14/22 77  08/19/21 88

## 2023-11-28 NOTE — Assessment & Plan Note (Signed)
 Eats like a bird per daughter, prepares her own food at home alone A little weight loss from her prior baseline which has been 109-110 for years  Wt Readings from Last 5 Encounters:  11/28/23 101 lb (45.8 kg)  09/14/22 109 lb (49.4 kg)  03/14/22 109 lb 11.2 oz (49.8 kg)  08/19/21 110 lb (49.9 kg)  08/10/20 110 lb (49.9 kg)   BMI Readings from Last 5 Encounters:  11/28/23 19.73 kg/m  09/14/22 21.29 kg/m  03/14/22 21.42 kg/m  08/19/21 21.48 kg/m  08/10/20 20.78 kg/m

## 2023-11-28 NOTE — Assessment & Plan Note (Signed)
 She is on Rosuvastatin  for dyslipidemia management. Due for labs, will attempt to keep pt on statin long term as long as tolerating Lab Results  Component Value Date   CHOL 159 08/19/2021   HDL 64 08/19/2021   LDLCALC 75 08/19/2021   TRIG 115 08/19/2021   CHOLHDL 2.5 08/19/2021    - Continue Rosuvastatin .

## 2023-11-28 NOTE — Assessment & Plan Note (Addendum)
 Severe anxiety, pt has been on meds before but stops meds or has poor compliance Nearly agoraphobic, pt has severe panic/anxiety to leave home or go anywhere Discussed possibly trying lexapro  again or can try prozac  Tamara Sparks experiences significant anxiety, particularly when leaving her home, suggestive of agoraphobia. Previous medications for anxiety were not well-tolerated due to sedation, leading to inconsistent adherence. - Consider a low dose of Lexapro  or Prozac, emphasizing daily use for 4-6 weeks to assess effectiveness. - Explore palliative care to manage chronic problems at home, reducing the need for office visits.

## 2023-11-28 NOTE — Progress Notes (Signed)
 Subjective:   Tamara Sparks is a 89 y.o. female who presents for Medicare Annual (Subsequent) preventive examination.  Visit Complete: In person  Patient Medicare AWV questionnaire was completed by the patient on 11/28/23; I have confirmed that all information answered by patient is correct and no changes since this date.  Cardiac Risk Factors include: advanced age (>30men, >39 women);dyslipidemia;hypertension;sedentary lifestyle     Objective:    Today's Vitals   11/28/23 1455  BP: (!) 156/70  Pulse: 82  Resp: 16  SpO2: 98%  Weight: 101 lb (45.8 kg)  Height: 5' (1.524 m)   Body mass index is 19.73 kg/m.     11/28/2023    5:19 PM 08/19/2021   11:45 AM 07/09/2020    2:22 PM 10/23/2019    2:35 PM 11/20/2018   11:21 AM 07/05/2018   11:41 PM 12/29/2017    1:21 PM  Advanced Directives  Does Patient Have a Medical Advance Directive? No No No Yes Yes No  No   Type of Aeronautical engineer of [place];Living will Healthcare Power of Attorney    Does patient want to make changes to medical advance directive?     No - Patient declined     Copy of Healthcare Power of Attorney in Chart?    Yes - validated most recent copy scanned in chart (See row information) Yes - validated most recent copy scanned in chart (See row information)     Would patient like information on creating a medical advance directive? Yes (MAU/Ambulatory/Procedural Areas - Information given) Yes (ED - Information included in AVS)    No - Patient declined       Data saved with a previous flowsheet row definition    Current Medications (verified) Outpatient Encounter Medications as of 11/28/2023  Medication Sig   amLODipine  (NORVASC ) 5 MG tablet TAKE 1 TABLET (5 MG TOTAL) BY MOUTH DAILY.   escitalopram  (LEXAPRO ) 5 MG tablet Take 1 tablet (5 mg total) by mouth daily. For moods/nerves, take daily for it to work   metoprolol  succinate (TOPROL -XL) 50 MG 24 hr tablet TAKE 1 TABLET BY MOUTH EVERY DAY WITH  OR IMMEDIATELY FOLLOWING A MEAL   rosuvastatin  (CRESTOR ) 10 MG tablet TAKE 1 TABLET BY MOUTH EVERY DAY   No facility-administered encounter medications on file as of 11/28/2023.    Allergies (verified) Sertraline , Penicillins, and Sulfa antibiotics   History: Past Medical History:  Diagnosis Date   Abnormal ankle brachial index (ABI) 03/11/2018   Anxiety state, unspecified    Cardiac dysrhythmia, unspecified    Depression    Diastolic murmur 01/23/2014   Dyslipidemia, goal LDL below 130    Dyspnea    DOE, patient does alot of work for living alone   Dysrhythmia    patient unsure of this   Heart murmur    History of lobectomy of lung 10/05/2017   Right upper lobe removed for TB   History of stroke    Right cerebellar intracerebral hemorrhage   History of TB (tuberculosis) 10/05/2017   Age: 47   HOH (hard of hearing)    Hyposmolality and/or hyponatremia    Multiple renal cysts 01/20/2018   Stroke (HCC) 2012   right hand shakes and can't write well   Tremors of nervous system    RIGHT ARM   Tuberculosis 1947   RIGHT UPPER LOBE EXCISION AGE 40   Unspecified essential hypertension    Unspecified vitamin D  deficiency    Urinary  tract infection, site not specified    Past Surgical History:  Procedure Laterality Date   CARDIAC CATHETERIZATION     [facility]   CATARACT EXTRACTION W/PHACO Right 03/08/2016   Procedure: CATARACT EXTRACTION PHACO AND INTRAOCULAR LENS PLACEMENT (IOC);  Surgeon: Steven Dingeldein, MD;  Location: [facility] ORS;  Service: Ophthalmology;  Laterality: Right;   FLUID CASSETTE [id number] H, EXP 07/19/17 US     3:16.8   AP%   27.2 CDE 94.14   EXTERNAL EAR SURGERY Left    LOBECTOMY Right 1952   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17   Family History  Problem Relation Age of Onset   Stroke Mother    Hypertension Father    Stroke Father    Hypertension Sister    Hypertension Brother    Cancer Brother        brain   Hypertension Sister    Hypertension Brother    Cancer  Brother        stomach   Social History   Socioeconomic History   Marital status: Widowed    Spouse name: Waylan   Number of children: 4   Years of education: Not on file   Highest education level: 9th grade  Occupational History   Occupation: Retired  Tobacco Use   Smoking status: Never   Smokeless tobacco: Never   Tobacco comments:    smoking cessation materials not required  Vaping Use   Vaping status: Never Used  Substance and Sexual Activity   Alcohol use: No    Alcohol/week: 0.0 standard drinks of alcohol   Drug use: No   Sexual activity: Not Currently  Other Topics Concern   Not on file  Social History Narrative   Not on file   Social Drivers of Health   Financial Resource Strain: Low Risk  (11/28/2023)   Overall Financial Resource Strain (CARDIA)    Difficulty of Paying Living Expenses: Not hard at all  Food Insecurity: No Food Insecurity (11/28/2023)   Hunger Vital Sign    Worried About Running Out of Food in the Last Year: Never true    Ran Out of Food in the Last Year: Never true  Transportation Needs: Unmet Transportation Needs (11/28/2023)   PRAPARE - Transportation    Lack of Transportation (Medical): Yes    Lack of Transportation (Non-Medical): Yes  Physical Activity: Inactive (11/28/2023)   Exercise Vital Sign    Days of Exercise per Week: 0 days    Minutes of Exercise per Session: 0 min  Stress: Stress Concern Present (11/28/2023)   [employer] of Occupational Health - Occupational Stress Questionnaire    Feeling of Stress: Very much  Social Connections: Unknown (11/28/2023)   Social Connection and Isolation Panel    Frequency of Communication with Friends and Family: Patient declined    Frequency of Social Gatherings with Friends and Family: Patient declined    Attends Religious Services: Patient declined    Database administrator or Organizations: Patient declined    Attends Banker Meetings: Patient declined    Marital Status: Widowed     Tobacco Counseling Counseling given: Not Answered Tobacco comments: smoking cessation materials not required   Clinical Intake:  Pre-visit preparation completed: No  Pain : No/denies pain     Nutritional Status: BMI of 19-24  Normal Nutritional Risks: None Diabetes: No  How often do you need to have someone help you when you read instructions, pamphlets, or other written materials from your doctor or pharmacy?: 2 -  Rarely  Interpreter Needed?: No      Activities of Daily Living    11/28/2023    3:10 PM  In your present state of health, do you have any difficulty performing the following activities:  Hearing? 1  Vision? 1  Difficulty concentrating or making decisions? 0  Walking or climbing stairs? 1  Dressing or bathing? 0  Doing errands, shopping? 1  Preparing Food and eating ? N  Using the Toilet? N  In the past six months, have you accidently leaked urine? Y  Do you have problems with loss of bowel control? N  Managing your Medications? Y  Managing your Finances? Y  Housekeeping or managing your Housekeeping? N    Patient Care Team: Tajae Rybicki, PA-C as PCP - General (Family Medicine) Eappen, Saramma, MD as Consulting Physician (Psychiatry) Emilio Garrie BRAVO, RN (Inactive) as Registered Nurse  Indicate any recent Medical Services you may have received from other than [facility] providers in the past year (date may be approximate).     Assessment:   This is a routine wellness examination for Tamara Sparks.  Hearing/Vision screen Unable to complete today   Goals Addressed             This Visit's Progress    CCM Expected Outcome:  Monitor, Self-Manage and Reduce Symptoms of:       Continuing to work on BP management and managing stress/anxiety/depression        Depression Screen    11/28/2023    2:58 PM 05/18/2023    8:25 AM 09/14/2022    8:52 AM 03/14/2022    8:59 AM 08/19/2021    9:21 AM 08/10/2020   10:25 AM 02/11/2020   10:40 AM  PHQ 2/9 Scores   PHQ - 2 Score 2 2 2 2 4  0 0  PHQ- 9 Score 2 3 3 4 4  0     Fall Risk    11/28/2023    5:24 PM 09/14/2022    8:51 AM 03/14/2022    8:59 AM 08/19/2021    9:21 AM 03/02/2021    1:16 PM  Fall Risk   Falls in the past year? 0 0 0 0 0  Number falls in past yr: 0  0 0 0  Injury with Fall? 0  0 0   Risk for fall due to : Impaired mobility Impaired balance/gait No Fall Risks No Fall Risks   Follow up Falls evaluation completed Falls prevention discussed Falls prevention discussed;Education provided  Falls prevention discussed       Data saved with a previous flowsheet row definition    MEDICARE RISK AT HOME: Medicare Risk at Home Any stairs in or around the home?: No If so, are there any without handrails?: No Home free of loose throw rugs in walkways, pet beds, electrical cords, etc?: Yes Adequate lighting in your home to reduce risk of falls?: Yes Life alert?: Yes Use of a cane, walker or w/c?: No Grab bars in the bathroom?: No Shower chair or bench in shower?: No Elevated toilet seat or a handicapped toilet?: No  TIMED UP AND GO:  Was the test performed?  Yes  Length of time to ambulate 10 feet: >10 sec Gait unsteady without use of assistive device, provider informed and interventions were implemented    Cognitive Function:   Cognitive Testing   Alert? Yes  Normal Appearance?Yes  Oriented to person? Yes  Place? Yes  Time? no Recall of three objects? no Can perform simple calculations? No  Displays appropriate judgment? No  Can read the correct time from a watch face?unable to assess due to [facility] and emotional distress          10/23/2019    2:41 PM 09/04/2017   11:34 AM  6CIT Screen  What Year? 0 points 0 points  What month? 0 points 0 points  What time? 0 points 0 points  Count back from 20 0 points 0 points  Months in reverse 0 points 0 points  Repeat phrase 6 points 0 points  Total Score 6 points 0 points    Immunizations Immunization History  Administered  Date(s) Administered   Fluad Quad(high Dose 65+) 02/11/2020, 03/14/2022   Influenza, High Dose Seasonal PF 03/16/2015, 03/13/2016, 02/21/2017   Influenza-Unspecified 02/19/2018   Pneumococcal Conjugate-13 09/04/2017   Pneumococcal Polysaccharide-23 05/22/2005    TDAP status: Due, Education has been provided regarding the importance of this vaccine. Advised may receive this vaccine at local pharmacy or Health Dept. Aware to provide a copy of the vaccination record if obtained from local pharmacy or Health Dept. Verbalized acceptance and understanding.  Flu Vaccine status: Declined, Education has been provided regarding the importance of this vaccine but patient still declined. Advised may receive this vaccine at local pharmacy or Health Dept. Aware to provide a copy of the vaccination record if obtained from local pharmacy or Health Dept. Verbalized acceptance and understanding.  Pneumococcal vaccine status: Up to date  Covid-19 vaccine status: Declined, Education has been provided regarding the importance of this vaccine but patient still declined. Advised may receive this vaccine at local pharmacy or Health Dept.or vaccine clinic. Aware to provide a copy of the vaccination record if obtained from local pharmacy or Health Dept. Verbalized acceptance and understanding.  Qualifies for Shingles Vaccine? Yes   Zostavax completed No   Shingrix Completed?: No.    Education has been provided regarding the importance of this vaccine. Patient has been advised to call insurance company to determine out of pocket expense if they have not yet received this vaccine. Advised may also receive vaccine at local pharmacy or Health Dept. Verbalized acceptance and understanding.  Screening Tests Health Maintenance  Topic Date Due   INFLUENZA VACCINE  12/21/2023   Medicare Annual Wellness (AWV)  11/27/2024   Pneumococcal Vaccine: 50+ Years  Completed   Hepatitis B Vaccines  Aged Out   HPV VACCINES  Aged Out    Meningococcal B Vaccine  Aged Out   DTaP/Tdap/Td  Discontinued   DEXA SCAN  Discontinued   COVID-19 Vaccine  Discontinued   Zoster Vaccines- Shingrix  Discontinued    Health Maintenance UTD per age and preference regarding vaccines/immunizations There are no preventive care reminders to display for this patient.   Colorectal cancer screening: No longer required.   Mammogram status: No longer required due to age/life expectancy. Bone denisty - not done previously and pt and daughter do not wish to do now  Lung Cancer Screening: (Low Dose CT Chest recommended if Age 25-80 years, 20 pack-year currently smoking OR have quit w/in 15years.) does not qualify.   Lung Cancer Screening Referral: n/a  Additional Screening:  Hepatitis C Screening: does not qualify; Completed perviously  Vision Screening: Recommended annual ophthalmology exams for early detection of glaucoma and other disorders of the eye. Is the patient up to date with their annual eye exam?  Yes  Who is the provider or what is the name of the office in which the patient attends annual eye exams?  If  pt is not established with a provider, would they like to be referred to a provider to establish care? No .   Dental Screening: Recommended annual dental exams for proper oral hygiene  Diabetic Foot Exam:n/a  Community Resource Referral / Chronic Care Management: CRR required this visit?  no  CCM required this visit?  Referrals made today      Plan:     I have personally reviewed and noted the following in the patient's chart:   Medical and social history Use of alcohol, tobacco or illicit drugs  Current medications and supplements including opioid prescriptions. Patient is not currently taking opioid prescriptions. Functional ability and status Nutritional status Physical activity Advanced directives List of other physicians Hospitalizations, surgeries, and ER visits in previous 12 months Vitals Screenings to  include cognitive, depression, and falls Referrals and appointments  In addition, I have reviewed and discussed with patient certain preventive protocols, quality metrics, and best practice recommendations. A written personalized care plan for preventive services as well as general preventive health recommendations were provided to patient.     Michelene Cower, PA-C   11/28/2023   After Visit Summary: (In Person-Printed) AVS printed and given to the patient

## 2023-11-28 NOTE — Assessment & Plan Note (Signed)
 Very HOH, chronic, stable

## 2023-11-28 NOTE — Assessment & Plan Note (Signed)
    11/28/2023    2:58 PM 05/18/2023    8:25 AM 09/14/2022    8:52 AM  Depression screen PHQ 2/9  Decreased Interest 1 1 1   Down, Depressed, Hopeless 1 1 1   PHQ - 2 Score 2 2 2   Altered sleeping 0 0 0  Tired, decreased energy 0 1 1  Change in appetite 0 0 0  Feeling bad or failure about yourself  0 0 0  Trouble concentrating 0 0 0  Moving slowly or fidgety/restless 0 0 0  Suicidal thoughts 0 0 0  PHQ-9 Score 2 3 3   Difficult doing work/chores   Not difficult at all   Tamara Sparks has depression and has been on Lexapro  and Zoloft  previously. She is reluctant to take medications due to sedation and inconsistent adherence. Discussed restarting Lexapro  or trying Prozac, which may have fewer sedative effects. - Consider restarting Lexapro  or trying Prozac for depression management. - Educate on the importance of consistent medication use.

## 2023-11-28 NOTE — Patient Instructions (Signed)
 Look into Patient Care aid assessment or application with your insurance  Consult with Social Work for further help with resources and options in caring for your mother at her home.  We should do a virtual appt in about 6 months and then 1 year from now I will try to get either home health or palliative care to help do a visit or check in on diagnosis and medications/labs etc.

## 2023-11-28 NOTE — Assessment & Plan Note (Signed)
 Onset many years ago, but never noted in chart Experiences urinary incontinence with small amounts of leakage, using protective garments, stable, no concerns at this time per daughter

## 2023-11-29 ENCOUNTER — Ambulatory Visit: Payer: Self-pay | Admitting: Family Medicine

## 2023-11-29 DIAGNOSIS — F320 Major depressive disorder, single episode, mild: Secondary | ICD-10-CM

## 2023-11-29 DIAGNOSIS — E785 Hyperlipidemia, unspecified: Secondary | ICD-10-CM

## 2023-11-29 DIAGNOSIS — I10 Essential (primary) hypertension: Secondary | ICD-10-CM

## 2023-11-29 DIAGNOSIS — F419 Anxiety disorder, unspecified: Secondary | ICD-10-CM

## 2023-11-29 MED ORDER — METOPROLOL SUCCINATE ER 50 MG PO TB24
ORAL_TABLET | ORAL | 3 refills | Status: AC
Start: 2023-11-29 — End: ?

## 2023-11-29 MED ORDER — ROSUVASTATIN CALCIUM 10 MG PO TABS: 10.0000 mg | ORAL_TABLET | Freq: Every day | ORAL | 3 refills | Status: AC

## 2023-11-29 MED ORDER — AMLODIPINE BESYLATE 5 MG PO TABS: 5.0000 mg | ORAL_TABLET | Freq: Every day | ORAL | 3 refills | Status: AC

## 2023-11-29 MED ORDER — ESCITALOPRAM OXALATE 5 MG PO TABS: 5.0000 mg | ORAL_TABLET | Freq: Every day | ORAL | 3 refills | Status: AC

## 2023-12-04 ENCOUNTER — Telehealth: Payer: Self-pay

## 2023-12-04 NOTE — Progress Notes (Signed)
 Complex Care Management Note Care Guide Note  12/04/2023 Name: Tamara Sparks MRN: [id number] DOB: 11-03-1928   Complex Care Management Outreach Attempts: An unsuccessful telephone outreach was attempted today to offer the patient information about available complex care management services.  Follow Up Plan:  Additional outreach attempts will be made to offer the patient complex care management information and services.   Encounter Outcome:  Patient Request to Call Back  Dreama [facility]  [facility], [facility] Health Care Management Assistant Direct Dial: [phone]  Fax: [phone]

## 2023-12-05 DIAGNOSIS — Z556 Problems related to health literacy: Secondary | ICD-10-CM | POA: Diagnosis not present

## 2023-12-05 DIAGNOSIS — I700 Atherosclerosis of aorta: Secondary | ICD-10-CM | POA: Diagnosis not present

## 2023-12-05 DIAGNOSIS — Z8673 Personal history of transient ischemic attack (TIA), and cerebral infarction without residual deficits: Secondary | ICD-10-CM | POA: Diagnosis not present

## 2023-12-05 DIAGNOSIS — I10 Essential (primary) hypertension: Secondary | ICD-10-CM | POA: Diagnosis not present

## 2023-12-05 DIAGNOSIS — R634 Abnormal weight loss: Secondary | ICD-10-CM | POA: Diagnosis not present

## 2023-12-05 DIAGNOSIS — E871 Hypo-osmolality and hyponatremia: Secondary | ICD-10-CM | POA: Diagnosis not present

## 2023-12-05 DIAGNOSIS — Z8672 Personal history of thrombophlebitis: Secondary | ICD-10-CM | POA: Diagnosis not present

## 2023-12-05 DIAGNOSIS — Z604 Social exclusion and rejection: Secondary | ICD-10-CM | POA: Diagnosis not present

## 2023-12-05 DIAGNOSIS — H9193 Unspecified hearing loss, bilateral: Secondary | ICD-10-CM | POA: Diagnosis not present

## 2023-12-05 DIAGNOSIS — E785 Hyperlipidemia, unspecified: Secondary | ICD-10-CM | POA: Diagnosis not present

## 2023-12-11 ENCOUNTER — Telehealth: Payer: Self-pay

## 2023-12-11 NOTE — Telephone Encounter (Signed)
 Copied from CRM [id number]. Topic: Clinical - Home Health Verbal Orders >> Dec 11, 2023  9:36 AM Delon DASEN wrote: Caller/Agency: Alan with [facility] Callback Number: [phone] Service Requested: Skilled Nursing Frequency: n/a Any new concerns about the patient? Yes- per patients daughter, patient is refusing any further visits, it is giving her anxiety due to not being able to hear.  Need orders to discontinue.

## 2023-12-11 NOTE — Telephone Encounter (Signed)
 Verbal orders given

## 2023-12-12 NOTE — Progress Notes (Signed)
 Complex Care Management Note  Care Guide Note 12/12/2023 Name: CHRISTAN CICCARELLI MRN: [id number] DOB: 1928/07/26  Velia KATHEE Mt is a 89 y.o. year old female who sees Tapia, Leisa, PA-C for primary care. I reached out to Velia KATHEE Mt by phone today to offer complex care management services.  Ms. Eichenberger was given information about Complex Care Management services today including:   The Complex Care Management services include support from the care team which includes your Nurse Care Manager, Clinical Social Worker, or Pharmacist.  The Complex Care Management team is here to help remove barriers to the health concerns and goals most important to you. Complex Care Management services are voluntary, and the patient may decline or stop services at any time by request to their care team member.   Complex Care Management Consent Status: Patient did not agree to participate in complex care management services at this time.  Follow up plan:  Daughter plans to follow up with PCP.  Encounter Outcome:  Patient Refused  Dreama Agent [facility], [facility] Health Care Management Assistant Direct Dial: [phone]  Fax: [phone]

## 2024-03-20 ENCOUNTER — Ambulatory Visit

## 2024-03-27 ENCOUNTER — Ambulatory Visit (INDEPENDENT_AMBULATORY_CARE_PROVIDER_SITE_OTHER)

## 2024-03-27 DIAGNOSIS — Z0000 Encounter for general adult medical examination without abnormal findings: Secondary | ICD-10-CM | POA: Diagnosis not present

## 2024-03-27 NOTE — Patient Instructions (Addendum)
 [name],  Thank you for taking the time for your Medicare Wellness Visit. I appreciate your continued commitment to your health goals. Please review the care plan we discussed, and feel free to reach out if I can assist you further.  Please note that Annual Wellness Visits do not include a physical exam. Some assessments may be limited, especially if the visit was conducted virtually. If needed, we may recommend an in-person follow-up with your provider.  Ongoing Care Seeing your primary care provider every 3 to 6 months helps us  monitor your health and provide consistent, personalized care.   Referrals If a referral was made during today's visit and you haven't received any updates within two weeks, please contact the referred provider directly to check on the status.  Recommended Screenings:  Health Maintenance  Topic Date Due   Flu Shot  12/21/2023   Medicare Annual Wellness Visit  03/27/2025   Pneumococcal Vaccine for age over 74  Completed   Meningitis B Vaccine  Aged Out   DTaP/Tdap/Td vaccine  Discontinued   DEXA scan (bone density measurement)  Discontinued   COVID-19 Vaccine  Discontinued   Zoster (Shingles) Vaccine  Discontinued       03/27/2024    8:58 AM  Advanced Directives  Does Patient Have a Medical Advance Directive? Yes  Type of Estate Agent of [place];Living will  Does patient want to make changes to medical advance directive? No - Patient declined  Copy of Healthcare Power of Attorney in Chart? Yes - validated most recent copy scanned in chart (See row information)    Vision: Annual vision screenings are recommended for early detection of glaucoma, cataracts, and diabetic retinopathy. These exams can also reveal signs of chronic conditions such as diabetes and high blood pressure.  Dental: Annual dental screenings help detect early signs of oral cancer, gum disease, and other conditions linked to overall health, including heart  disease and diabetes.  Please see the attached documents for additional preventive care recommendations.   NEXT AWV 04/02/25 @ 8:50 AM IN PERSON

## 2024-03-27 NOTE — Progress Notes (Signed)
 Subjective:   Tamara Sparks is a 89 y.o. female who presents for a Medicare Annual Wellness Visit.  I connected with  Tamara Sparks on 03/27/24 by a audio enabled telemedicine application and verified that I am speaking with the correct person using two identifiers.  Patient Location: Home  Provider Location: Office/Clinic  I discussed the limitations of evaluation and management by telemedicine. The patient expressed understanding and agreed to proceed.   Allergies (verified) Sertraline , Penicillins, and Sulfa antibiotics   History: Past Medical History:  Diagnosis Date   Abnormal ankle brachial index (ABI) 03/11/2018   Anxiety state, unspecified    Cardiac dysrhythmia, unspecified    Depression    Diastolic murmur 01/23/2014   Dyslipidemia, goal LDL below 130    Dyspnea    DOE, patient does alot of work for living alone   Dysrhythmia    patient unsure of this   Heart murmur    History of lobectomy of lung 10/05/2017   Right upper lobe removed for TB   History of stroke    Right cerebellar intracerebral hemorrhage   History of TB (tuberculosis) 10/05/2017   Age: 65   HOH (hard of hearing)    Hyposmolality and/or hyponatremia    Multiple renal cysts 01/20/2018   Stroke (HCC) 2012   right hand shakes and can't write well   Tremors of nervous system    RIGHT ARM   Tuberculosis 1947   RIGHT UPPER LOBE EXCISION AGE 38   Unspecified essential hypertension    Unspecified vitamin D  deficiency    Urinary tract infection, site not specified    Past Surgical History:  Procedure Laterality Date   CARDIAC CATHETERIZATION     [facility]   CATARACT EXTRACTION W/PHACO Right 03/08/2016   Procedure: CATARACT EXTRACTION PHACO AND INTRAOCULAR LENS PLACEMENT (IOC);  Surgeon: Steven Dingeldein, MD;  Location: [facility] ORS;  Service: Ophthalmology;  Laterality: Right;   FLUID CASSETTE [id number] H, EXP 07/19/17 US     3:16.8   AP%   27.2 CDE 94.14   EXTERNAL EAR SURGERY Left    LOBECTOMY  Right 1952   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17   Family History  Problem Relation Age of Onset   Stroke Mother    Hypertension Father    Stroke Father    Hypertension Sister    Hypertension Brother    Cancer Brother        brain   Hypertension Sister    Hypertension Brother    Cancer Brother        stomach   Social History   Occupational History   Occupation: Retired  Tobacco Use   Smoking status: Never   Smokeless tobacco: Never   Tobacco comments:    smoking cessation materials not required  Vaping Use   Vaping status: Never Used  Substance and Sexual Activity   Alcohol use: No    Alcohol/week: 0.0 standard drinks of alcohol   Drug use: No   Sexual activity: Not Currently   Tobacco Counseling Counseling given: Not Answered Tobacco comments: smoking cessation materials not required  SDOH Screenings   Food Insecurity: No Food Insecurity (11/28/2023)  Housing: Unknown (11/28/2023)  Transportation Needs: Unmet Transportation Needs (11/28/2023)  Utilities: Not At Risk (11/28/2023)  Alcohol Screen: Low Risk  (11/28/2023)  Depression (PHQ2-9): Low Risk  (11/28/2023)  Financial Resource Strain: Low Risk  (11/28/2023)  Physical Activity: Inactive (11/28/2023)  Social Connections: Unknown (11/28/2023)  Stress: Stress Concern Present (11/28/2023)  Tobacco Use:  Low Risk  (11/28/2023)  Health Literacy: Inadequate Health Literacy (11/28/2023)   Depression Screen    11/28/2023    2:58 PM 05/18/2023    8:25 AM 09/14/2022    8:52 AM 03/14/2022    8:59 AM 08/19/2021    9:21 AM 08/10/2020   10:25 AM 02/11/2020   10:40 AM  PHQ 2/9 Scores  PHQ - 2 Score 2 2 2 2 4  0 0  PHQ- 9 Score 2 3 3 4 4  0      Goals Addressed   None    Visit info / Clinical Intake: Interpreter Needed?: No  Functional Status Activities of Daily Living (to include ambulation/medication): (!) Needs Assist Feeding: Independent Dressing/Grooming: Independent Bathing: Needs assistance Toileting: Independent Transfer:  Independent Ambulation: Independent Medication Administration: Needs assistance (comment) Home Management: Needs assistance (comment)  Fall Screening Falls in the past year?: 0 Number of falls in past year: 0 Was there an injury with Fall?: 0 Fall Risk Category Calculator: 0 Patient Fall Risk Level: Low Fall Risk  Fall Risk Patient at Risk for Falls Due to: Impaired mobility Fall risk Follow up: Falls evaluation completed  Advance Directives (For Healthcare) Does Patient Have a Medical Advance Directive?: No Would patient like information on creating a medical advance directive?: Yes (MAU/Ambulatory/Procedural Areas - Information given)        Objective:    There were no vitals filed for this visit. There is no height or weight on file to calculate BMI.  Current Medications (verified) Outpatient Encounter Medications as of 03/27/2024  Medication Sig   amLODipine  (NORVASC ) 5 MG tablet Take 1 tablet (5 mg total) by mouth daily.   escitalopram  (LEXAPRO ) 5 MG tablet Take 1 tablet (5 mg total) by mouth daily. For moods/nerves, take daily for it to work   metoprolol  succinate (TOPROL -XL) 50 MG 24 hr tablet TAKE 1 TABLET BY MOUTH EVERY DAY WITH OR IMMEDIATELY FOLLOWING A MEAL   rosuvastatin  (CRESTOR ) 10 MG tablet Take 1 tablet (10 mg total) by mouth daily.   No facility-administered encounter medications on file as of 03/27/2024.   Hearing/Vision screen No results found. Immunizations and Health Maintenance Health Maintenance  Topic Date Due   Influenza Vaccine  12/21/2023   Medicare Annual Wellness (AWV)  11/27/2024   Pneumococcal Vaccine: 50+ Years  Completed   Meningococcal B Vaccine  Aged Out   DTaP/Tdap/Td  Discontinued   DEXA SCAN  Discontinued   COVID-19 Vaccine  Discontinued   Zoster Vaccines- Shingrix  Discontinued        Assessment/Plan:  This is a routine wellness examination for Tamara Sparks.  Patient Care Team: Tapia, Leisa, PA-C as PCP - General (Family  Medicine) Eappen, Saramma, MD as Consulting Physician (Psychiatry)  I have personally reviewed and noted the following in the patient's chart:   Medical and social history Use of alcohol, tobacco or illicit drugs  Current medications and supplements including opioid prescriptions. Functional ability and status Nutritional status Physical activity Advanced directives List of other physicians Hospitalizations, surgeries, and ER visits in previous 12 months Vitals Screenings to include cognitive, depression, and falls Referrals and appointments  No orders of the defined types were placed in this encounter.  In addition, I have reviewed and discussed with patient certain preventive protocols, quality metrics, and best practice recommendations. A written personalized care plan for preventive services as well as general preventive health recommendations were provided to patient.   Jhonnie GORMAN Das, LPN   [id number]   No follow-ups on file.  After Visit Summary: (MyChart) Due to this being a telephonic visit, the after visit summary with patients personalized plan was offered to patient via MyChart   Nurse Notes:

## 2024-05-30 ENCOUNTER — Telehealth: Admitting: Family Medicine

## 2025-04-02 ENCOUNTER — Ambulatory Visit
